# Patient Record
Sex: Female | Born: 1950
Health system: Southern US, Community
[De-identification: ages and names within clinical notes are randomized; demographics above are authoritative.]

## PROBLEM LIST (undated history)

## (undated) DIAGNOSIS — H3530 Unspecified macular degeneration: Secondary | ICD-10-CM

## (undated) DIAGNOSIS — R569 Unspecified convulsions: Secondary | ICD-10-CM

## (undated) DIAGNOSIS — M1990 Unspecified osteoarthritis, unspecified site: Secondary | ICD-10-CM

## (undated) DIAGNOSIS — F419 Anxiety disorder, unspecified: Secondary | ICD-10-CM

## (undated) DIAGNOSIS — H269 Unspecified cataract: Secondary | ICD-10-CM

## (undated) DIAGNOSIS — F329 Major depressive disorder, single episode, unspecified: Secondary | ICD-10-CM

## (undated) DIAGNOSIS — M797 Fibromyalgia: Secondary | ICD-10-CM

## (undated) DIAGNOSIS — J449 Chronic obstructive pulmonary disease, unspecified: Secondary | ICD-10-CM

## (undated) DIAGNOSIS — F32A Depression, unspecified: Secondary | ICD-10-CM

## (undated) HISTORY — DX: Anxiety disorder, unspecified: F41.9

## (undated) HISTORY — DX: Unspecified macular degeneration: H35.30

## (undated) HISTORY — DX: Unspecified cataract: H26.9

## (undated) HISTORY — DX: Unspecified osteoarthritis, unspecified site: M19.90

## (undated) HISTORY — DX: Depression, unspecified: F32.A

## (undated) HISTORY — DX: Unspecified convulsions: R56.9

## (undated) HISTORY — DX: Fibromyalgia: M79.7

## (undated) HISTORY — DX: Chronic obstructive pulmonary disease, unspecified: J44.9

## (undated) HISTORY — DX: Major depressive disorder, single episode, unspecified: F32.9

## (undated) HISTORY — PX: ABDOMINAL HYSTERECTOMY: SHX81

---

## 1999-01-14 ENCOUNTER — Encounter: Payer: Self-pay | Admitting: Emergency Medicine

## 1999-01-14 ENCOUNTER — Emergency Department (HOSPITAL_COMMUNITY): Admission: EM | Admit: 1999-01-14 | Discharge: 1999-01-14 | Payer: Self-pay | Admitting: Emergency Medicine

## 1999-04-05 ENCOUNTER — Encounter: Payer: Self-pay | Admitting: Internal Medicine

## 1999-04-05 ENCOUNTER — Inpatient Hospital Stay (HOSPITAL_COMMUNITY): Admission: EM | Admit: 1999-04-05 | Discharge: 1999-04-06 | Payer: Self-pay | Admitting: Emergency Medicine

## 1999-06-02 ENCOUNTER — Inpatient Hospital Stay (HOSPITAL_COMMUNITY): Admission: EM | Admit: 1999-06-02 | Discharge: 1999-06-05 | Payer: Self-pay | Admitting: *Deleted

## 2001-10-10 ENCOUNTER — Other Ambulatory Visit: Admission: RE | Admit: 2001-10-10 | Discharge: 2001-10-10 | Payer: Self-pay | Admitting: Obstetrics and Gynecology

## 2004-02-18 ENCOUNTER — Ambulatory Visit: Payer: Self-pay | Admitting: Family Medicine

## 2004-02-19 ENCOUNTER — Encounter: Admission: RE | Admit: 2004-02-19 | Discharge: 2004-02-19 | Payer: Self-pay | Admitting: Family Medicine

## 2004-02-25 ENCOUNTER — Ambulatory Visit: Payer: Self-pay | Admitting: Family Medicine

## 2004-02-25 ENCOUNTER — Other Ambulatory Visit: Admission: RE | Admit: 2004-02-25 | Discharge: 2004-02-25 | Payer: Self-pay | Admitting: Family Medicine

## 2004-03-02 ENCOUNTER — Ambulatory Visit: Payer: Self-pay

## 2004-03-13 ENCOUNTER — Ambulatory Visit: Payer: Self-pay | Admitting: Gastroenterology

## 2004-04-02 ENCOUNTER — Ambulatory Visit: Payer: Self-pay | Admitting: Gastroenterology

## 2004-06-16 ENCOUNTER — Ambulatory Visit: Payer: Self-pay | Admitting: Family Medicine

## 2004-07-01 ENCOUNTER — Ambulatory Visit: Payer: Self-pay | Admitting: Family Medicine

## 2004-10-01 ENCOUNTER — Ambulatory Visit: Payer: Self-pay | Admitting: Family Medicine

## 2004-10-13 ENCOUNTER — Ambulatory Visit: Payer: Self-pay | Admitting: Family Medicine

## 2005-03-25 ENCOUNTER — Ambulatory Visit: Payer: Self-pay | Admitting: Family Medicine

## 2005-08-17 ENCOUNTER — Ambulatory Visit: Payer: Self-pay | Admitting: Family Medicine

## 2005-09-01 ENCOUNTER — Ambulatory Visit: Payer: Self-pay | Admitting: Family Medicine

## 2005-09-30 ENCOUNTER — Emergency Department (HOSPITAL_COMMUNITY): Admission: EM | Admit: 2005-09-30 | Discharge: 2005-09-30 | Payer: Self-pay | Admitting: Emergency Medicine

## 2006-05-24 DIAGNOSIS — F329 Major depressive disorder, single episode, unspecified: Secondary | ICD-10-CM

## 2006-05-24 DIAGNOSIS — M779 Enthesopathy, unspecified: Secondary | ICD-10-CM | POA: Insufficient documentation

## 2009-11-16 ENCOUNTER — Encounter: Admission: RE | Admit: 2009-11-16 | Discharge: 2009-11-16 | Payer: Self-pay | Admitting: Emergency Medicine

## 2010-02-22 ENCOUNTER — Encounter: Payer: Self-pay | Admitting: Family Medicine
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
## 2010-06-19 NOTE — Cardiovascular Report (Signed)
[place]  Patient:    Jasmin Munoz, Jasmin Munoz                       MRN: [id number] Proc. Date: 04/06/99 Adm. Date:  [id number] Disc. Date: [id number] Attending:  Lewayne Bunting CC:         [place]. Andrey Campanile, M.D.             Maylon Peppers Cleta Alberts, M.D., [facility]             Bruce R. Juanda Chance, M.D. [facility]             Cardiopulmonary Laboratory             Rudene Christians. Ladona Ridgel, M.D. [facility]                        Cardiac Catheterization  CLINICAL HISTORY:  Ms. Pinckney is 60 years old and has positive risk factors for  coronary disease with a positive family history and cigarette smoking.  She was  seen in [facility] by Dr. Cleta Alberts yesterday with chest pain radiating up nto her neck and arm and was referred to Dr. Ladona Ridgel, who admitted her with a presumptive diagnosis of possible unstable angina.  DESCRIPTION OF PROCEDURE:  The procedure was performed via the right femoral artery using an arterial sheath and 6 French preformed coronary catheters.  A front wall arterial puncture was performed and Omnipaque contrast was used.  The right femoral artery was closed with Perclose at the end of the procedure.  The patient tolerated the procedure well and left the laboratory in satisfactory condition.  RESULTS:  The left main coronary artery:  The left main coronary artery was free of significant disease.  Left anterior descending:  The left anterior descending artery gave rise to two  septal perforators and three diagonal branches.  These and the LAD proper were ree of significant disease.  Circumflex artery:  The circumflex artery gave rise to a marginal branch and three posterolateral branches.  These vessels were free of significant disease.  Right coronary artery:  The right coronary was a large dominant vessel that gave rise to a posterior descending and three posterolateral branches.  These vessels were free of significant disease.  LEFT  VENTRICULOGRAPHY:  The left ventriculogram was performed in the RAO projection showed good wall motion with no areas of hypokinesis.  The estimated ejection fraction was 60%.  The aortic pressure was 122/70 with a mean of 90.  The left ventricular pressure was 122/13.  CONCLUSIONS:  Normal coronary angiography and normal left ventricular wall motion.  RECOMMENDATIONS:  Reassurance.  The patient has only had one episode of chest pain. She does not have other symptoms suggestive of reflux.  Her O2 saturations during the case was 98%.  I will not plan any further evaluation at this time and will  arrange followup with Dr. Andrey Campanile, who can decide if she needs any further evaluation should she have recurrent symptoms. DD:  04/06/99 TD:  04/07/99 Job: [id number]

## 2010-06-19 NOTE — Discharge Summary (Signed)
[place]  Patient:    Jasmin Munoz, Jasmin Munoz                         MRN: Adm. Date:  04/05/99 Disc. Date: 04/06/99 Attending:  Doylene Canning. Ladona Ridgel, M.D. [facility] Dictator:   Gene Serpe, P.A. CC:         [place]. Andrey Campanile, M.D.                           Discharge Summary  PROCEDURE:  Cardiac catheterization on April 06, 1999.  HISTORY OF PRESENT ILLNESS:  [name] is a 60 year old female, without a prior history of heart disease, but with cardiac risk factors notable for tobacco smoking, and a family history of coronary artery disease, who presented with new onset chest discomfort.  She was initially seen in an urgent medical clinic, where she reported some relief with nitroglycerin; however, she continued to have residual chest pain and was referred to [facility] for further evaluation and management.  LABORATORY DATA:  Metabolic profile and CBC within normal limits.  CPK-MB negative x 2.  Troponin I less than 0.03 x 2.  Normal TSH.  Negative urinalysis.  HOSPITAL COURSE:  The patient ruled out for a myocardial infarction with negative serial cardiac enzymes.  Aspirin, Lopressor, and Lovenox were added to the medication regimen.  A cardiac catheterization performed on the day of discharge by Dr. Everardo Beals. Juanda Chance revealed normal coronary arteries and normal left ventricle.  Post-procedure the patient was mildly hypotensive (90-100 range), and was treated with fluids.  She was ambulatory without complications of bleeding from her groin. Of note, the incision site was closed with Perclose.  DISCHARGE MEDICATIONS:  Paxil 20 mg q.d.  INSTRUCTIONS:  The patient is to refrain from any strenuous activity or driving for two days.  She is to discontinue smoking tobacco.  FOLLOWUP:  The patient is instructed to schedule a follow-up appointment with her primary care physician, Dr. Duffy Rhody C. Wilson, in two weeks.  DISCHARGE  DIAGNOSES: 1. Noncardiac chest pain:    a. Normal coronary arteries/left ventricle.  Cardiac catheterization on       April 06, 1999. 2. Hypotension. 3. Tobacco smoking. 4. History of depression. DD:  04/06/99 TD:  04/06/99 Job: [id number]

## 2010-12-17 ENCOUNTER — Telehealth: Payer: Self-pay | Admitting: Family Medicine

## 2010-12-17 NOTE — Telephone Encounter (Signed)
NO I cannot accept this patient

## 2010-12-17 NOTE — Telephone Encounter (Signed)
Pt called to establish as a new pt and said she had medicare. Pt was informed at this time we are not excepting new medicare pts. Pt said you came highly recomended to her and she would pay out of pocket she was informed that it is 125 upfront at every visit and that she would be billed for the remainder. She is currently going to a pain management clinic to get her medications but does not want to go monthly to get her meds any more and is hoping to est with you. Please advise

## 2011-01-07 ENCOUNTER — Ambulatory Visit (INDEPENDENT_AMBULATORY_CARE_PROVIDER_SITE_OTHER): Payer: Medicare Other | Admitting: Family Medicine

## 2011-01-07 DIAGNOSIS — I10 Essential (primary) hypertension: Secondary | ICD-10-CM

## 2011-01-07 DIAGNOSIS — R21 Rash and other nonspecific skin eruption: Secondary | ICD-10-CM

## 2011-02-12 DIAGNOSIS — M533 Sacrococcygeal disorders, not elsewhere classified: Secondary | ICD-10-CM | POA: Diagnosis not present

## 2011-02-12 DIAGNOSIS — F341 Dysthymic disorder: Secondary | ICD-10-CM | POA: Diagnosis not present

## 2011-02-12 DIAGNOSIS — R209 Unspecified disturbances of skin sensation: Secondary | ICD-10-CM | POA: Diagnosis not present

## 2011-02-12 DIAGNOSIS — M545 Low back pain, unspecified: Secondary | ICD-10-CM | POA: Diagnosis not present

## 2011-03-02 ENCOUNTER — Encounter: Payer: Self-pay | Admitting: Gastroenterology

## 2011-03-11 DIAGNOSIS — R209 Unspecified disturbances of skin sensation: Secondary | ICD-10-CM | POA: Diagnosis not present

## 2011-03-11 DIAGNOSIS — Z79899 Other long term (current) drug therapy: Secondary | ICD-10-CM | POA: Diagnosis not present

## 2011-03-11 DIAGNOSIS — M545 Low back pain, unspecified: Secondary | ICD-10-CM | POA: Diagnosis not present

## 2011-03-11 DIAGNOSIS — M533 Sacrococcygeal disorders, not elsewhere classified: Secondary | ICD-10-CM | POA: Diagnosis not present

## 2011-03-11 DIAGNOSIS — M542 Cervicalgia: Secondary | ICD-10-CM | POA: Diagnosis not present

## 2011-03-11 DIAGNOSIS — F341 Dysthymic disorder: Secondary | ICD-10-CM | POA: Diagnosis not present

## 2011-04-06 DIAGNOSIS — R209 Unspecified disturbances of skin sensation: Secondary | ICD-10-CM | POA: Diagnosis not present

## 2011-04-06 DIAGNOSIS — F341 Dysthymic disorder: Secondary | ICD-10-CM | POA: Diagnosis not present

## 2011-04-06 DIAGNOSIS — M545 Low back pain, unspecified: Secondary | ICD-10-CM | POA: Diagnosis not present

## 2011-04-06 DIAGNOSIS — M533 Sacrococcygeal disorders, not elsewhere classified: Secondary | ICD-10-CM | POA: Diagnosis not present

## 2011-05-20 DIAGNOSIS — M545 Low back pain, unspecified: Secondary | ICD-10-CM | POA: Diagnosis not present

## 2011-05-20 DIAGNOSIS — R209 Unspecified disturbances of skin sensation: Secondary | ICD-10-CM | POA: Diagnosis not present

## 2011-05-20 DIAGNOSIS — M533 Sacrococcygeal disorders, not elsewhere classified: Secondary | ICD-10-CM | POA: Diagnosis not present

## 2011-05-20 DIAGNOSIS — F341 Dysthymic disorder: Secondary | ICD-10-CM | POA: Diagnosis not present

## 2011-05-21 ENCOUNTER — Ambulatory Visit: Payer: Medicare Other

## 2011-06-17 ENCOUNTER — Encounter: Payer: Self-pay | Admitting: Family Medicine

## 2011-06-17 ENCOUNTER — Ambulatory Visit (INDEPENDENT_AMBULATORY_CARE_PROVIDER_SITE_OTHER): Payer: Medicare Other | Admitting: Family Medicine

## 2011-06-17 VITALS — BP 122/86 | HR 84 | Temp 98.1°F | Resp 16 | Ht 60.75 in | Wt 131.4 lb

## 2011-06-17 DIAGNOSIS — R21 Rash and other nonspecific skin eruption: Secondary | ICD-10-CM

## 2011-06-17 DIAGNOSIS — M542 Cervicalgia: Secondary | ICD-10-CM

## 2011-06-17 DIAGNOSIS — Z79899 Other long term (current) drug therapy: Secondary | ICD-10-CM

## 2011-06-17 LAB — COMPREHENSIVE METABOLIC PANEL
ALT: 11 U/L (ref 0–35)
AST: 19 U/L (ref 0–37)
Albumin: 4.4 g/dL (ref 3.5–5.2)
Alkaline Phosphatase: 91 U/L (ref 39–117)
BUN: 15 mg/dL (ref 6–23)
CO2: 26 mEq/L (ref 19–32)
Calcium: 9.4 mg/dL (ref 8.4–10.5)
Chloride: 106 mEq/L (ref 96–112)
Creat: 0.75 mg/dL (ref 0.50–1.10)
Glucose, Bld: 82 mg/dL (ref 70–99)
Potassium: 4.9 mEq/L (ref 3.5–5.3)
Sodium: 140 mEq/L (ref 135–145)
Total Bilirubin: 0.4 mg/dL (ref 0.3–1.2)
Total Protein: 6.9 g/dL (ref 6.0–8.3)

## 2011-06-17 LAB — CBC WITH DIFFERENTIAL/PLATELET
Basophils Absolute: 0.1 10*3/uL (ref 0.0–0.1)
Basophils Relative: 1 % (ref 0–1)
Eosinophils Absolute: 0.1 10*3/uL (ref 0.0–0.7)
Eosinophils Relative: 1 % (ref 0–5)
HCT: 42.5 % (ref 36.0–46.0)
Hemoglobin: 14.2 g/dL (ref 12.0–15.0)
Lymphocytes Relative: 33 % (ref 12–46)
Lymphs Abs: 2.9 10*3/uL (ref 0.7–4.0)
MCH: 30.9 pg (ref 26.0–34.0)
MCHC: 33.4 g/dL (ref 30.0–36.0)
MCV: 92.4 fL (ref 78.0–100.0)
Monocytes Absolute: 0.5 10*3/uL (ref 0.1–1.0)
Monocytes Relative: 6 % (ref 3–12)
Neutro Abs: 5.3 10*3/uL (ref 1.7–7.7)
Neutrophils Relative %: 59 % (ref 43–77)
Platelets: 259 10*3/uL (ref 150–400)
RBC: 4.6 MIL/uL (ref 3.87–5.11)
RDW: 13.1 % (ref 11.5–15.5)
WBC: 9 10*3/uL (ref 4.0–10.5)

## 2011-06-17 LAB — POCT SEDIMENTATION RATE: POCT SED RATE: 21 mm/hr (ref 0–22)

## 2011-06-17 MED ORDER — CEPHALEXIN 500 MG PO CAPS: 500.0000 mg | ORAL_CAPSULE | Freq: Three times a day (TID) | ORAL | Status: DC

## 2011-06-17 MED ORDER — HYDROCODONE-ACETAMINOPHEN 10-650 MG PO TABS: 1.0000 | ORAL_TABLET | Freq: Four times a day (QID) | ORAL | Status: DC | PRN
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
## 2011-06-17 NOTE — Patient Instructions (Signed)
Narcotic Withdrawal  When drug use interferes with normal living activities and relationships, it is abuse. Abuse includes problems with family and friends. Psychological dependence has developed when your mind tells you that the drug is needed. This is usually followed by a physical dependence in which you need more of the drug to get the same feeling or "high." This is known as addiction or chemical dependency. Risk is greater when chemical dependency exists in the family.  SYMPTOMS   When tolerance to narcotics has developed, stopping of the narcotic suddenly can cause uncomfortable physical symptoms. Most of the time these are mild and consist of shakes or jitters (tremors) in the hands,a rapid heart rate, rapid breathing, and temperature. Sometimes these symptoms are associated with anxiety, panic attacks, and bad dreams. Other symptoms include:   Irritability.   Anxiety.   Runny nose.   "Goose flesh."   Diarrhea.   Feeling sick to the stomach (nauseous).   Muscle spasms.   Sleeplessness.   Chills.   Sweats.   Drug cravings.   Confusion.  The severity of the withdrawal is based on the individual and varies from person to person. Many people choose to continue using narcotics to get rid of the discomfort of withdrawal. They also use to try to feel normal.  TREATMENT   Quitting an addiction means stopping use of all chemicals. This is hard but may save your life. With continual drug use, possible outcomes are often loss of self respect and esteem, violence, death, and eventually prison if the use of narcotics has led to the death of another.  Addiction cannot be cured, but it can be stopped. This often requires outside help and the care of professionals. Most hospitals and clinics can refer you to a specialized care center.  It is not necessary for you to go through the uncomfortable symptoms of withdrawal. Your caregiver can provide you with medications that will help you through this difficult  period. Try to avoid situations, friends, or alcohol, which may have made it possible for you to continue using narcotics in the past. Learn how to say no!  HOME CARE INSTRUCTIONS    Drink fluids, get plenty of rest, and take hot baths.   Medicines may be prescribed to help control withdrawal symptoms.   Over-the-counter medicines may be helpful to control diarrhea or an upset stomach.   If your problems resulted from taking prescription pain medicines, make sure you have a follow-up visit with your caregiver within the next few days. Be open about this problem.   If you are dependent or addicted to street drugs, contact a local drug and alcohol treatment center or Narcotics Anonymous.   Have someone with you to monitor your symptoms.   Engage in healthy activities with friends who do not use drugs.   Stay away from the drug scene.  It takes a long period of time to overcome addictions to all drugs. There may be times when you feel as though you want to use. Following loss of a physical addiction and going through withdrawal, you have conquered the most difficult part of getting rid of an addiction. Gradually, you will have a lessening of the craving that is telling you that you need narcotics to feel normal. Call your caregiver or a member of your support group if more support is needed. Learn who to talk to in your family and among your friends so that during these periods you can receive outside help.  SEEK IMMEDIATE   MEDICAL CARE IF:    You have vomiting that cannot be controlled, especially if you cannot keep liquids down.   You are seeing things or hearing voices that are not really there (hallucinating).   You have a seizure.  Document Released: 04/10/2002 Document Revised: 01/07/2011 Document Reviewed: 01/14/2008  [employer] Patient Information 2012 [employer].

## 2011-07-01 ENCOUNTER — Ambulatory Visit: Payer: Medicare Other | Admitting: Family Medicine

## 2011-07-01 ENCOUNTER — Other Ambulatory Visit: Payer: Self-pay | Admitting: Family Medicine

## 2011-07-01 DIAGNOSIS — G8929 Other chronic pain: Secondary | ICD-10-CM

## 2011-07-01 DIAGNOSIS — R21 Rash and other nonspecific skin eruption: Secondary | ICD-10-CM

## 2011-07-01 MED ORDER — DOXYCYCLINE HYCLATE 100 MG PO TABS: 100.0000 mg | ORAL_TABLET | Freq: Two times a day (BID) | ORAL | Status: DC

## 2011-07-01 NOTE — Progress Notes (Signed)
Patient says the Lorcet is tearing up her stomach and wants to get another Rx.  I explained that as a family practice doctor, I cannot write for chronic pain meds.  Also the skin rash on left arm is coming back  P:  Doxycycline 100 bid See narcotic counselor Ardelle Lesches

## 2011-07-04 ENCOUNTER — Ambulatory Visit: Payer: Medicare Other

## 2011-08-22 DIAGNOSIS — H1045 Other chronic allergic conjunctivitis: Secondary | ICD-10-CM | POA: Diagnosis not present

## 2011-08-22 DIAGNOSIS — M129 Arthropathy, unspecified: Secondary | ICD-10-CM | POA: Diagnosis not present

## 2011-08-22 DIAGNOSIS — G8929 Other chronic pain: Secondary | ICD-10-CM | POA: Diagnosis not present

## 2011-08-22 DIAGNOSIS — L259 Unspecified contact dermatitis, unspecified cause: Secondary | ICD-10-CM | POA: Diagnosis not present

## 2011-09-28 ENCOUNTER — Telehealth: Payer: Self-pay

## 2011-09-28 DIAGNOSIS — M542 Cervicalgia: Secondary | ICD-10-CM

## 2011-09-28 NOTE — Telephone Encounter (Signed)
Pt states she would like to talk with dr Milus Glazier about being referred to a pain clinic  Best number [phone]

## 2011-09-29 ENCOUNTER — Other Ambulatory Visit: Payer: Self-pay | Admitting: Family Medicine

## 2011-09-29 DIAGNOSIS — G8929 Other chronic pain: Secondary | ICD-10-CM

## 2011-09-29 NOTE — Telephone Encounter (Signed)
Pt has decided that she would like to have a referral to a new pain specialist as discussed at OV w/Dr L in May. She would like to see Dr Claudette Laws if possible. Dr L I have put this referral order in for you per your OV notes, "verbal w/readback" for your approval.

## 2011-11-22 ENCOUNTER — Telehealth: Payer: Self-pay

## 2011-11-22 NOTE — Telephone Encounter (Signed)
Pt would like a referral to Dr Caswell Corwin?? Says she has been trying to get this appt for a month and does not know what the problem is

## 2011-11-22 NOTE — Telephone Encounter (Signed)
Can you check on referral to pain clinic?

## 2011-11-30 ENCOUNTER — Ambulatory Visit (INDEPENDENT_AMBULATORY_CARE_PROVIDER_SITE_OTHER): Payer: Medicare Other | Admitting: Family Medicine

## 2011-11-30 VITALS — BP 130/98 | HR 87 | Temp 98.7°F | Resp 18 | Wt 132.0 lb

## 2011-11-30 DIAGNOSIS — M542 Cervicalgia: Secondary | ICD-10-CM

## 2011-11-30 DIAGNOSIS — M47812 Spondylosis without myelopathy or radiculopathy, cervical region: Secondary | ICD-10-CM | POA: Diagnosis not present

## 2011-11-30 MED ORDER — HYDROCODONE-ACETAMINOPHEN 10-325 MG PO TABS: 1.0000 | ORAL_TABLET | Freq: Three times a day (TID) | ORAL | Status: DC | PRN

## 2011-11-30 NOTE — Progress Notes (Signed)
Patient is complaining of left sided occipital and posterior neck pain over a month, worse with movement of neck.  No arm weakness, radicular symptoms or numbness  Objective:  NAD Good arm ROM, strength Reflexes:  Good BJ. TJ Neck ROM:  Rotation 30 degrees each way, 30 degrees extension and flexion   Findings: There is posterior tilting of the dens. The dens is  sclerotic. There is thickening of the transverse ligament. This  may be due to arthropathy or possibly old healed fracture of the  dens. No bone marrow edema. There is an effusion in the lateral  facet of C1 and C2, likely due to arthropathy. Negative for acute  fracture or mass lesion. Spinal cord signal is normal. 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 This is  most likely an incidental finding.  C2-3: Negative  C3-4: Mild disc and facet degeneration without stenosis.  C4-5: Negative  C5-6: Moderate disc degeneration and spondylosis. Diffuse  uncovertebral spurring causes mild spinal stenosis and mild  foraminal encroachment bilaterally.  C6-7: Disc degeneration and spondylosis. There is diffuse  uncovertebral spurring without significant spinal stenosis.  C7-T1: Grade 1 anterior slip with facet degeneration. No  significant spinal stenosis  IMPRESSION:  Cervical disc degeneration and spondylosis, most prominent at C5-6  with mild spinal stenosis and foraminal narrowing bilaterally due  to spurring.  Sclerotic dense with thickening of the transverse ligament. This  is most likely due to arthropathy such as CPPD or rheumatoid  arthritis. This could also be post traumatic.  Assessment:  Neck arthritis, chronic

## 2011-11-30 NOTE — Patient Instructions (Addendum)

## 2011-12-10 ENCOUNTER — Encounter: Payer: Self-pay | Admitting: Gastroenterology

## 2012-02-15 ENCOUNTER — Other Ambulatory Visit: Payer: Self-pay | Admitting: *Deleted

## 2012-02-15 MED ORDER — METOPROLOL TARTRATE 50 MG PO TABS: 50.0000 mg | ORAL_TABLET | Freq: Two times a day (BID) | ORAL | Status: DC

## 2012-02-22 ENCOUNTER — Ambulatory Visit (INDEPENDENT_AMBULATORY_CARE_PROVIDER_SITE_OTHER): Payer: Medicare Other | Admitting: Family Medicine

## 2012-02-22 VITALS — BP 131/91 | HR 62 | Temp 97.9°F | Resp 16 | Ht 61.7 in | Wt 128.4 lb

## 2012-02-22 DIAGNOSIS — M47812 Spondylosis without myelopathy or radiculopathy, cervical region: Secondary | ICD-10-CM | POA: Diagnosis not present

## 2012-02-22 DIAGNOSIS — I10 Essential (primary) hypertension: Secondary | ICD-10-CM

## 2012-02-22 MED ORDER — HYDROCODONE-ACETAMINOPHEN 7.5-325 MG PO TABS: 1.0000 | ORAL_TABLET | Freq: Three times a day (TID) | ORAL | Status: DC | PRN

## 2012-02-22 MED ORDER — METOPROLOL TARTRATE 50 MG PO TABS: 50.0000 mg | ORAL_TABLET | Freq: Every day | ORAL | Status: DC

## 2012-02-22 MED ORDER — HYDROCODONE-ACETAMINOPHEN 7.5-750 MG PO TABS: 1.0000 | ORAL_TABLET | Freq: Three times a day (TID) | ORAL | Status: DC | PRN

## 2012-02-22 NOTE — Progress Notes (Signed)
55 woman with chronic neck pain.  She is here for hypertension review. She also needs to have her pain medicine reviewed. Seeing Dr. Toni Arthurs  Obj:  NAD Neck:  No bruits Chest:  Clear Heart: reg, no murmur Ext:  1+ edema  Assessment:  Controlled htn  Plan:  1. Neck arthritis  Ambulatory referral to Pain Clinic, HYDROcodone-acetaminophen (VICODIN ES) 7.5-750 MG per tablet  2. Hypertension  metoprolol (LOPRESSOR) 50 MG tablet

## 2012-02-22 NOTE — Patient Instructions (Signed)

## 2012-02-24 ENCOUNTER — Ambulatory Visit: Payer: Medicare Other | Admitting: Family Medicine

## 2012-02-24 NOTE — Progress Notes (Signed)
Addendum to note of 1/21

## 2012-03-23 ENCOUNTER — Telehealth: Payer: Self-pay | Admitting: Radiology

## 2012-03-23 NOTE — Telephone Encounter (Signed)
Patient has Rx for Hydrocodone it was written by Dr Milus Glazier for one every 8 hours, she wants to take it one every 6 hours. I have advised pharmacy she should take it as directed, one every 8 hours. Dr Milus Glazier is out of the office and can not alter dose. Jasmin Munoz FYI

## 2012-03-23 NOTE — Telephone Encounter (Signed)
She must take this as prescribed. We cannot change the frequency

## 2012-03-24 ENCOUNTER — Ambulatory Visit (INDEPENDENT_AMBULATORY_CARE_PROVIDER_SITE_OTHER): Payer: Medicare Other | Admitting: Family Medicine

## 2012-03-24 VITALS — BP 124/87 | HR 76 | Temp 97.9°F | Resp 16 | Ht 61.0 in | Wt 129.4 lb

## 2012-03-24 DIAGNOSIS — N390 Urinary tract infection, site not specified: Secondary | ICD-10-CM | POA: Diagnosis not present

## 2012-03-24 DIAGNOSIS — G894 Chronic pain syndrome: Secondary | ICD-10-CM | POA: Diagnosis not present

## 2012-03-24 DIAGNOSIS — Z8739 Personal history of other diseases of the musculoskeletal system and connective tissue: Secondary | ICD-10-CM | POA: Diagnosis not present

## 2012-03-24 DIAGNOSIS — R350 Frequency of micturition: Secondary | ICD-10-CM

## 2012-03-24 LAB — POCT URINALYSIS DIPSTICK
Bilirubin, UA: NEGATIVE
Nitrite, UA: NEGATIVE
pH, UA: 6

## 2012-03-24 LAB — POCT UA - MICROSCOPIC ONLY: Yeast, UA: NEGATIVE

## 2012-03-24 MED ORDER — HYDROCODONE-ACETAMINOPHEN 7.5-325 MG PO TABS: 1.0000 | ORAL_TABLET | Freq: Three times a day (TID) | ORAL | Status: DC | PRN

## 2012-03-24 MED ORDER — CIPROFLOXACIN HCL 500 MG PO TABS: 500.0000 mg | ORAL_TABLET | Freq: Two times a day (BID) | ORAL | Status: DC

## 2012-03-24 NOTE — Progress Notes (Signed)
Subjective:  62 year old lady who usually sees Dr. Elbert Ewings., but [place] is [place] the [place]. She presents today with a four-day history of urinary frequency and urgency and dysuria. She has not had a lot of urinary tract infections. She tried last night using a patch to decrease her urine flow, but it didn't help a lot. She is up every 20-30 minutes all night. She has burning when she urinates. She has not been running a fever.  She has never heard from the referral to the pain clinic that was initiated a month ago. She was given 120 of the pain pills or prescription last month, and noting the number of pills she took him out of fashion that use them in 30 days, averaging 4 per day. She says she did not see that the label said every 8 hours, that she was always given a month supply per prescription in the past. She denies intentional abuse.  She does see a psychiatrist who manages her other medications. She is having a lot of trouble with her nerves, and distress from her 66 year old husband who has early onset Alzheimer's.  Objective: Pleasant alert female in no major distress. No major CVA tenderness of her back is a little tender, presumably chronic from her fibromyalgia. Her abdomen is soft and nontender. She is afebrile.  Results for orders placed in visit on 03/24/12  POCT UA - MICROSCOPIC ONLY      Result Value Range   WBC, Ur, HPF, POC TNTC     RBC, urine, microscopic 0-1     Bacteria, U Microscopic 3+     Mucus, UA positive     Epithelial cells, urine per micros 0-2     Crystals, Ur, HPF, POC neg     Casts, Ur, LPF, POC neg     Yeast, UA neg    POCT URINALYSIS DIPSTICK      Result Value Range   Color, UA yellow     Clarity, UA turbid     Glucose, UA neg     Bilirubin, UA neg     Ketones, UA 15     Spec Grav, UA 1.020     Blood, UA trace-lysed     pH, UA 6.0     Protein, UA 30     Urobilinogen, UA 0.2     Nitrite, UA neg     Leukocytes, UA moderate (2+)     Assessment: Urinary  tract infection Chronic pain syndrome with fibromyalgia  Plan: Try to reorder the pain clinic referral to have them check on why she has not heard from this yet. Presumably some cut section of been made by now.  Will give her an intermediate 10 day supply of her pain medication and tell pharmacy lateral refill the other bottle of medicine. She understands that she is supposed to 3 pills a day, so a full prescription bottle her doctor her son's prescriptions should last 40 days or so. She is urged to minimize the use of pain medications. That would help her in preparation for going to the pain clinic.  Treat urinary tract infection with Cipro. She is allergic to sulfa.  Return if problems

## 2012-03-24 NOTE — Patient Instructions (Signed)
Urinary Tract Infection Urinary tract infections (UTIs) can develop anywhere along your urinary tract. Your urinary tract is your body's drainage system for removing wastes and extra water. Your urinary tract includes two kidneys, two ureters, a bladder, and a urethra. Your kidneys are a pair of bean-shaped organs. Each kidney is about the size of your fist. They are located below your ribs, one on each side of your spine. CAUSES Infections are caused by microbes, which are microscopic organisms, including fungi, viruses, and bacteria. These organisms are so small that they can only be seen through a microscope. Bacteria are the microbes that most commonly cause UTIs. SYMPTOMS  Symptoms of UTIs may vary by age and gender of the patient and by the location of the infection. Symptoms in young women typically include a frequent and intense urge to urinate and a painful, burning feeling in the bladder or urethra during urination. Older women and men are more likely to be tired, shaky, and weak and have muscle aches and abdominal pain. A fever may mean the infection is in your kidneys. Other symptoms of a kidney infection include pain in your back or sides below the ribs, nausea, and vomiting. DIAGNOSIS To diagnose a UTI, your caregiver will ask you about your symptoms. Your caregiver also will ask to provide a urine sample. The urine sample will be tested for bacteria and white blood cells. White blood cells are made by your body to help fight infection. TREATMENT  Typically, UTIs can be treated with medication. Because most UTIs are caused by a bacterial infection, they usually can be treated with the use of antibiotics. The choice of antibiotic and length of treatment depend on your symptoms and the type of bacteria causing your infection. HOME CARE INSTRUCTIONS  If you were prescribed antibiotics, take them exactly as your caregiver instructs you. Finish the medication even if you feel better after you  have only taken some of the medication.  Drink enough water and fluids to keep your urine clear or pale yellow.  Avoid caffeine, tea, and carbonated beverages. They tend to irritate your bladder.  Empty your bladder often. Avoid holding urine for long periods of time.  Empty your bladder before and after sexual intercourse.  After a bowel movement, women should cleanse from front to back. Use each tissue only once. SEEK MEDICAL CARE IF:   You have back pain.  You develop a fever.  Your symptoms do not begin to resolve within 3 days. SEEK IMMEDIATE MEDICAL CARE IF:   You have severe back pain or lower abdominal pain.  You develop chills.  You have nausea or vomiting.  You have continued burning or discomfort with urination. MAKE SURE YOU:   Understand these instructions.  Will watch your condition.  Will get help right away if you are not doing well or get worse. Document Released: 10/28/2004 Document Revised: 07/20/2011 Document Reviewed: 02/26/2011 [employer] Patient Information 2013 [employer].  

## 2012-03-27 ENCOUNTER — Other Ambulatory Visit: Payer: Self-pay | Admitting: Radiology

## 2012-03-27 MED ORDER — AMOXICILLIN 500 MG PO TABS: 500.0000 mg | ORAL_TABLET | Freq: Three times a day (TID) | ORAL | Status: DC

## 2012-04-22 ENCOUNTER — Telehealth: Payer: Self-pay

## 2012-04-22 DIAGNOSIS — Z8739 Personal history of other diseases of the musculoskeletal system and connective tissue: Secondary | ICD-10-CM

## 2012-04-22 DIAGNOSIS — G894 Chronic pain syndrome: Secondary | ICD-10-CM

## 2012-04-22 NOTE — Telephone Encounter (Signed)
Raynelle Fanning from [facility] called saying that the patient is in need of RX Refill of hydrocodone, the pt has been seen Dr. Elbert Ewings

## 2012-04-24 ENCOUNTER — Telehealth: Payer: Self-pay

## 2012-04-24 MED ORDER — HYDROCODONE-ACETAMINOPHEN 7.5-325 MG PO TABS: 1.0000 | ORAL_TABLET | Freq: Three times a day (TID) | ORAL | Status: DC | PRN

## 2012-04-24 NOTE — Telephone Encounter (Signed)
Okay to refill the hydrocodone x 1.  I am confused about why patient has not gotten in with a pain clinic to date.  Can someone help me find out why?

## 2012-04-24 NOTE — Telephone Encounter (Signed)
Called in. Called patient to advise, and to check status of pain management. She states she did hear from the pain management clinic. She states she does not want to go back. She went couple weeks ago. I told her to call. She states she wants to see if she can go to pain management at [facility]. I told her to call them and see if they will accept her,but I do not think they will. I told her if they will accept the referral, we will send. To you FYI. I advised this is the last Rx from you.

## 2012-04-24 NOTE — Telephone Encounter (Signed)
Darl Pikes from [facility] is calling back to talk to Amy or another nurse about the Pts medication. Call [facility] back at [phone]

## 2012-04-25 NOTE — Telephone Encounter (Signed)
She has a refill #30 2/21 from Dr Alwyn Ren #120 then has Rx written #120  02/22/12 refilled on 03/29/12 has one refill left for quantity of 120. Please see me about this, do you want her to have the #120 or the #30, pharmacy concerned she has taken quantity of 240 already this month.

## 2012-04-30 ENCOUNTER — Telehealth: Payer: Self-pay

## 2012-04-30 NOTE — Telephone Encounter (Signed)
DR L - PT SAYS SHE HAS A PRESCRIPTION OF HYDROCODONE WAITING FOR HER AT THE PHARMACY.  SAYS SHE WANTS THE SCRIPT CANCELLED BECAUSE SHE IS "NOT GOING BACK DOWN THAT ROAD."  SAYS TO TELL DR. Ronnald Ramp YOU FOR EVERYTHING. [phone]

## 2012-05-01 ENCOUNTER — Telehealth: Payer: Self-pay

## 2012-05-01 NOTE — Telephone Encounter (Signed)
PATIENT IS CALLING TO HAVE A PAIN MEDICATION REFILLED. PATIENT STATES THAT SHE HAS ALREADY CONTACTED HER PHARMACY ABOUT THIS. PATIENT STATES THAT SHE HAS GONE 5 DAYS WITHOUT HER PAIN MEDS.   PHARMACY: [facility] [address] CALL BACK: [phone]

## 2012-05-01 NOTE — Telephone Encounter (Signed)
This is phone call #3 from her today. To you

## 2012-05-01 NOTE — Telephone Encounter (Signed)
Pharmacy holds #30 and #120, she has gone to pain management, refuses to go back. Wants to know if you will release the Rx to her. Please advise.

## 2012-05-01 NOTE — Telephone Encounter (Signed)
Duplicate, message sent to Dr L this am.

## 2012-05-01 NOTE — Telephone Encounter (Signed)
Pt states she will begin a pain management clinic on 05/12/12. Pt is requesting 11 days of hyrdrocodone, to hold her over until she goes to the pain management clinic Please call pt to advise

## 2012-05-01 NOTE — Telephone Encounter (Signed)
Pt is wanting to talk with dr Jenness Corner about going back on the pain medications that she was taking her self off of earlier this week and since the rx at [facility] [address] has been cancelled they state it will have to be a new  rx   Best number for patient is [phone]

## 2012-05-02 NOTE — Telephone Encounter (Signed)
I cannot refill narcotics.  I gave her enough to last until her pain clinic referral.  I cannot do any more

## 2012-05-02 NOTE — Telephone Encounter (Signed)
I have called her to advise.  

## 2012-05-12 DIAGNOSIS — G541 Lumbosacral plexus disorders: Secondary | ICD-10-CM | POA: Diagnosis not present

## 2012-05-12 DIAGNOSIS — Z9181 History of falling: Secondary | ICD-10-CM | POA: Diagnosis not present

## 2012-05-12 DIAGNOSIS — M545 Low back pain, unspecified: Secondary | ICD-10-CM | POA: Diagnosis not present

## 2012-05-12 DIAGNOSIS — H8190 Unspecified disorder of vestibular function, unspecified ear: Secondary | ICD-10-CM | POA: Diagnosis not present

## 2012-05-12 DIAGNOSIS — Z79899 Other long term (current) drug therapy: Secondary | ICD-10-CM | POA: Diagnosis not present

## 2012-05-12 DIAGNOSIS — S335XXA Sprain of ligaments of lumbar spine, initial encounter: Secondary | ICD-10-CM | POA: Diagnosis not present

## 2012-05-12 DIAGNOSIS — H81399 Other peripheral vertigo, unspecified ear: Secondary | ICD-10-CM | POA: Diagnosis not present

## 2012-05-12 DIAGNOSIS — M533 Sacrococcygeal disorders, not elsewhere classified: Secondary | ICD-10-CM | POA: Diagnosis not present

## 2012-05-12 DIAGNOSIS — G608 Other hereditary and idiopathic neuropathies: Secondary | ICD-10-CM | POA: Diagnosis not present

## 2012-05-12 DIAGNOSIS — M542 Cervicalgia: Secondary | ICD-10-CM | POA: Diagnosis not present

## 2012-05-30 ENCOUNTER — Telehealth: Payer: Self-pay | Admitting: Family

## 2012-05-30 NOTE — Telephone Encounter (Signed)
Spoke with pt. She states that she is being seen at a pain clinic currently and she "can't stand it." She says "it's just a money making thing" and they want her to go to PT and counseling and she has been through all of that before. She states that she is trying to "come off" of the narcotics. Advised pt again that we do not tx chronic pain and if she is truly wanting to wean off, then she will need to continue to see pain clinic. Pt decided to cancel appointment

## 2012-05-30 NOTE — Telephone Encounter (Signed)
Please advise patient that I would be glad to see her but [facility] does not treat chronic pain. She can see the pain clinic. Let me know what she decides and cancel appointment if she does not still want to be seen. Thank you!

## 2012-06-02 ENCOUNTER — Ambulatory Visit: Payer: Self-pay | Admitting: Family

## 2012-06-30 DIAGNOSIS — L280 Lichen simplex chronicus: Secondary | ICD-10-CM | POA: Diagnosis not present

## 2012-06-30 DIAGNOSIS — L570 Actinic keratosis: Secondary | ICD-10-CM | POA: Diagnosis not present

## 2012-07-21 DIAGNOSIS — L280 Lichen simplex chronicus: Secondary | ICD-10-CM | POA: Diagnosis not present

## 2012-07-21 DIAGNOSIS — L570 Actinic keratosis: Secondary | ICD-10-CM | POA: Diagnosis not present

## 2012-10-05 DIAGNOSIS — H353190 Nonexudative age-related macular degeneration, unspecified eye, stage unspecified: Secondary | ICD-10-CM | POA: Diagnosis not present

## 2012-10-05 DIAGNOSIS — Z961 Presence of intraocular lens: Secondary | ICD-10-CM | POA: Diagnosis not present

## 2012-12-07 ENCOUNTER — Other Ambulatory Visit: Payer: Self-pay

## 2013-02-03 ENCOUNTER — Emergency Department (HOSPITAL_COMMUNITY): Payer: Medicare Other

## 2013-02-03 ENCOUNTER — Encounter (HOSPITAL_COMMUNITY): Payer: Self-pay | Admitting: Emergency Medicine

## 2013-02-03 ENCOUNTER — Inpatient Hospital Stay (HOSPITAL_COMMUNITY)
Admission: EM | Admit: 2013-02-03 | Discharge: 2013-02-04 | DRG: 192 | Disposition: A | Discharge status: Home / Self Care | Payer: Medicare Other | Attending: Internal Medicine | Admitting: Internal Medicine

## 2013-02-03 DIAGNOSIS — Z87891 Personal history of nicotine dependence: Secondary | ICD-10-CM

## 2013-02-03 DIAGNOSIS — R059 Cough, unspecified: Secondary | ICD-10-CM | POA: Diagnosis not present

## 2013-02-03 DIAGNOSIS — I10 Essential (primary) hypertension: Secondary | ICD-10-CM | POA: Diagnosis present

## 2013-02-03 DIAGNOSIS — F411 Generalized anxiety disorder: Secondary | ICD-10-CM | POA: Diagnosis present

## 2013-02-03 DIAGNOSIS — IMO0001 Reserved for inherently not codable concepts without codable children: Secondary | ICD-10-CM | POA: Diagnosis present

## 2013-02-03 DIAGNOSIS — F3289 Other specified depressive episodes: Secondary | ICD-10-CM | POA: Diagnosis not present

## 2013-02-03 DIAGNOSIS — R9431 Abnormal electrocardiogram [ECG] [EKG]: Secondary | ICD-10-CM | POA: Diagnosis present

## 2013-02-03 DIAGNOSIS — R0902 Hypoxemia: Secondary | ICD-10-CM | POA: Diagnosis present

## 2013-02-03 DIAGNOSIS — R5381 Other malaise: Secondary | ICD-10-CM | POA: Diagnosis not present

## 2013-02-03 DIAGNOSIS — H3530 Unspecified macular degeneration: Secondary | ICD-10-CM | POA: Diagnosis present

## 2013-02-03 DIAGNOSIS — R0602 Shortness of breath: Secondary | ICD-10-CM | POA: Diagnosis not present

## 2013-02-03 DIAGNOSIS — M779 Enthesopathy, unspecified: Secondary | ICD-10-CM

## 2013-02-03 DIAGNOSIS — R05 Cough: Secondary | ICD-10-CM | POA: Diagnosis not present

## 2013-02-03 DIAGNOSIS — F329 Major depressive disorder, single episode, unspecified: Secondary | ICD-10-CM | POA: Diagnosis present

## 2013-02-03 DIAGNOSIS — Z79899 Other long term (current) drug therapy: Secondary | ICD-10-CM | POA: Diagnosis not present

## 2013-02-03 DIAGNOSIS — J4489 Other specified chronic obstructive pulmonary disease: Secondary | ICD-10-CM | POA: Diagnosis not present

## 2013-02-03 DIAGNOSIS — J441 Chronic obstructive pulmonary disease with (acute) exacerbation: Principal | ICD-10-CM | POA: Diagnosis present

## 2013-02-03 DIAGNOSIS — M129 Arthropathy, unspecified: Secondary | ICD-10-CM | POA: Diagnosis present

## 2013-02-03 DIAGNOSIS — J449 Chronic obstructive pulmonary disease, unspecified: Secondary | ICD-10-CM | POA: Diagnosis not present

## 2013-02-03 DIAGNOSIS — R5383 Other fatigue: Secondary | ICD-10-CM | POA: Diagnosis not present

## 2013-02-03 LAB — CBC WITH DIFFERENTIAL/PLATELET
BASOS PCT: 0 % (ref 0–1)
Basophils Absolute: 0 10*3/uL (ref 0.0–0.1)
Eosinophils Absolute: 0 10*3/uL (ref 0.0–0.7)
Eosinophils Relative: 0 % (ref 0–5)
HEMATOCRIT: 40 % (ref 36.0–46.0)
HEMOGLOBIN: 13.2 g/dL (ref 12.0–15.0)
LYMPHS ABS: 2.3 10*3/uL (ref 0.7–4.0)
Lymphocytes Relative: 23 % (ref 12–46)
MCH: 29.5 pg (ref 26.0–34.0)
MCHC: 33 g/dL (ref 30.0–36.0)
MCV: 89.3 fL (ref 78.0–100.0)
MONO ABS: 0.8 10*3/uL (ref 0.1–1.0)
MONOS PCT: 8 % (ref 3–12)
NEUTROS ABS: 7 10*3/uL (ref 1.7–7.7)
NEUTROS PCT: 69 % (ref 43–77)
Platelets: 292 10*3/uL (ref 150–400)
RBC: 4.48 MIL/uL (ref 3.87–5.11)
RDW: 12.4 % (ref 11.5–15.5)
WBC: 10.1 10*3/uL (ref 4.0–10.5)

## 2013-02-03 LAB — TROPONIN I: Troponin I: <0.3 ng/mL (ref <0.30)

## 2013-02-03 LAB — URINE MICROSCOPIC-ADD ON

## 2013-02-03 LAB — COMPREHENSIVE METABOLIC PANEL
ALK PHOS: 86 U/L (ref 39–117)
ALT: 11 U/L (ref 0–35)
AST: 18 U/L (ref 0–37)
Albumin: 3.6 g/dL (ref 3.5–5.2)
BILIRUBIN TOTAL: 0.5 mg/dL (ref 0.3–1.2)
BUN: 10 mg/dL (ref 6–23)
CHLORIDE: 102 meq/L (ref 96–112)
CO2: 22 meq/L (ref 19–32)
CREATININE: 0.61 mg/dL (ref 0.50–1.10)
Calcium: 8.9 mg/dL (ref 8.4–10.5)
GLUCOSE: 141 mg/dL — AB (ref 70–99)
POTASSIUM: 3.6 meq/L — AB (ref 3.7–5.3)
Sodium: 143 mEq/L (ref 137–147)
Total Protein: 7 g/dL (ref 6.0–8.3)

## 2013-02-03 LAB — URINALYSIS, ROUTINE W REFLEX MICROSCOPIC
Glucose, UA: NEGATIVE mg/dL
Hgb urine dipstick: NEGATIVE
KETONES UR: 40 mg/dL — AB
NITRITE: NEGATIVE
PH: 7.5 (ref 5.0–8.0)
PROTEIN: NEGATIVE mg/dL
Specific Gravity, Urine: 1.018 (ref 1.005–1.030)
Urobilinogen, UA: 1 mg/dL (ref 0.0–1.0)

## 2013-02-03 LAB — INFLUENZA PANEL BY PCR (TYPE A & B)
H1N1 flu by pcr: NOT DETECTED
INFLBPCR: NEGATIVE
Influenza A By PCR: NEGATIVE

## 2013-02-03 LAB — PRO B NATRIURETIC PEPTIDE: PRO B NATRI PEPTIDE: 590.1 pg/mL — AB (ref 0–125)

## 2013-02-03 MED ORDER — SODIUM CHLORIDE 0.9 % IV SOLN: 250.0000 mL | INTRAVENOUS | Status: DC | PRN

## 2013-02-03 MED ORDER — ENOXAPARIN SODIUM 40 MG/0.4ML ~~LOC~~ SOLN
40.0000 mg | SUBCUTANEOUS | Status: DC
  Administered 2013-02-03: 40 mg via SUBCUTANEOUS
  Filled 2013-02-03 (×3): qty 0.4

## 2013-02-03 MED ORDER — METOPROLOL TARTRATE 50 MG PO TABS
50.0000 mg | ORAL_TABLET | Freq: Every day | ORAL | Status: DC
  Administered 2013-02-03 – 2013-02-04 (×2): 50 mg via ORAL
  Filled 2013-02-03 (×2): qty 1

## 2013-02-03 MED ORDER — MORPHINE SULFATE 2 MG/ML IJ SOLN: 1.0000 mg | INTRAMUSCULAR | Status: DC | PRN

## 2013-02-03 MED ORDER — LORAZEPAM 2 MG/ML IJ SOLN
0.5000 mg | Freq: Once | INTRAMUSCULAR | Status: AC
  Administered 2013-02-03: 0.5 mg via INTRAVENOUS
  Filled 2013-02-03 (×2): qty 1

## 2013-02-03 MED ORDER — IPRATROPIUM BROMIDE 0.02 % IN SOLN
0.5000 mg | Freq: Four times a day (QID) | RESPIRATORY_TRACT | Status: DC
  Administered 2013-02-03: 0.5 mg via RESPIRATORY_TRACT
  Filled 2013-02-03: qty 2.5

## 2013-02-03 MED ORDER — LEVOFLOXACIN IN D5W 750 MG/150ML IV SOLN
750.0000 mg | Freq: Once | INTRAVENOUS | Status: AC
  Administered 2013-02-03: 750 mg via INTRAVENOUS
  Filled 2013-02-03: qty 150

## 2013-02-03 MED ORDER — SODIUM CHLORIDE 0.9 % IJ SOLN
3.0000 mL | Freq: Two times a day (BID) | INTRAMUSCULAR | Status: DC
  Administered 2013-02-04: 3 mL via INTRAVENOUS

## 2013-02-03 MED ORDER — DIAZEPAM 5 MG PO TABS
10.0000 mg | ORAL_TABLET | Freq: Four times a day (QID) | ORAL | Status: DC | PRN
  Administered 2013-02-03 – 2013-02-04 (×2): 10 mg via ORAL
  Filled 2013-02-03 (×3): qty 2

## 2013-02-03 MED ORDER — ONDANSETRON HCL 4 MG/2ML IJ SOLN: 4.0000 mg | Freq: Four times a day (QID) | INTRAMUSCULAR | Status: DC | PRN

## 2013-02-03 MED ORDER — PAROXETINE HCL 20 MG PO TABS
40.0000 mg | ORAL_TABLET | ORAL | Status: DC
  Administered 2013-02-04: 40 mg via ORAL
  Filled 2013-02-03 (×2): qty 2

## 2013-02-03 MED ORDER — SODIUM CHLORIDE 0.9 % IJ SOLN: 3.0000 mL | Freq: Two times a day (BID) | INTRAMUSCULAR | Status: DC

## 2013-02-03 MED ORDER — ACETAMINOPHEN 325 MG PO TABS: 650.0000 mg | ORAL_TABLET | Freq: Four times a day (QID) | ORAL | Status: DC | PRN

## 2013-02-03 MED ORDER — GUAIFENESIN-DM 100-10 MG/5ML PO SYRP
5.0000 mL | ORAL_SOLUTION | ORAL | Status: DC | PRN
  Administered 2013-02-04: 5 mL via ORAL
  Filled 2013-02-03: qty 10

## 2013-02-03 MED ORDER — METOPROLOL TARTRATE 50 MG PO TABS
50.0000 mg | ORAL_TABLET | Freq: Every day | ORAL | Status: DC
Start: 2013-02-03 — End: 2013-02-03

## 2013-02-03 MED ORDER — SODIUM CHLORIDE 0.9 % IJ SOLN: 3.0000 mL | INTRAMUSCULAR | Status: DC | PRN

## 2013-02-03 MED ORDER — ONDANSETRON HCL 4 MG PO TABS: 4.0000 mg | ORAL_TABLET | Freq: Four times a day (QID) | ORAL | Status: DC | PRN

## 2013-02-03 MED ORDER — LEVOFLOXACIN 500 MG PO TABS
500.0000 mg | ORAL_TABLET | Freq: Every day | ORAL | Status: DC
  Administered 2013-02-04: 500 mg via ORAL
  Filled 2013-02-03: qty 1

## 2013-02-03 MED ORDER — LEVALBUTEROL HCL 0.63 MG/3ML IN NEBU
0.6300 mg | INHALATION_SOLUTION | Freq: Four times a day (QID) | RESPIRATORY_TRACT | Status: DC
  Administered 2013-02-03: 0.63 mg via RESPIRATORY_TRACT
  Filled 2013-02-03 (×2): qty 3

## 2013-02-03 MED ORDER — METHYLPREDNISOLONE SODIUM SUCC 40 MG IJ SOLR
40.0000 mg | Freq: Two times a day (BID) | INTRAMUSCULAR | Status: DC
  Administered 2013-02-03 – 2013-02-04 (×2): 40 mg via INTRAVENOUS
  Filled 2013-02-03 (×5): qty 1

## 2013-02-03 MED ORDER — ACETAMINOPHEN 650 MG RE SUPP: 650.0000 mg | Freq: Four times a day (QID) | RECTAL | Status: DC | PRN

## 2013-02-03 NOTE — ED Notes (Signed)
Bed: [id number] Expected date: 02/03/13 Expected time: 11:29 AM Means of arrival: Ambulance Comments: COPD wheezing bil 2nd treatment

## 2013-02-03 NOTE — ED Provider Notes (Signed)
CSN: [id number]     Arrival date & time 02/03/13  1126 History   First MD Initiated Contact with Patient 02/03/13 1149     Chief Complaint  Patient presents with  . Shortness of Breath  . Cough   (Consider location/radiation/quality/duration/timing/severity/associated sxs/prior Treatment) HPI Comments: Patient presents to the ER for evaluation of cough and difficulty breathing. Patient has been sick for more than one week. Patient's grandson recently came to visit and had a fever when he arrived. Several days later she started having symptoms. Patient does have a history of COPD. She has had progressively worsening shortness of breath. Fever has been as high as 102. Patient arrives by EMS. She was given Solu-Medrol 125 mg and albuterol 10 mg, Atrovent 0.5 mg during transport.  Patient is a 63 y.o. female presenting with shortness of breath and cough.  Shortness of Breath Associated symptoms: cough and fever   Cough Associated symptoms: fever and shortness of breath     Past Medical History  Diagnosis Date  . Arthritis   . Depression   . Seizures   . Cataract   . Anxiety   . COPD (chronic obstructive pulmonary disease)   . Fibromyalgia   . Macular degeneration    Past Surgical History  Procedure Laterality Date  . Abdominal hysterectomy     Family History  Problem Relation Age of Onset  . Emphysema Mother   . Arthritis Father   . Arthritis Sister   . Joint hypermobility Brother    History  Substance Use Topics  . Smoking status: Former Smoker -- 1.00 packs/day for 35 years    Types: Cigarettes    Quit date: 01/27/2013  . Smokeless tobacco: Not on file  . Alcohol Use: No   OB History   Grav Para Term Preterm Abortions TAB SAB Ect Mult Living                 Review of Systems  Constitutional: Positive for fever and fatigue.  Respiratory: Positive for cough and shortness of breath.   All other systems reviewed and are negative.    Allergies  Sulfa  antibiotics  Home Medications   Current Outpatient Rx  Name  Route  Sig  Dispense  Refill  . amoxicillin (AMOXIL) 500 MG tablet   Oral   Take 1 tablet (500 mg total) by mouth 3 (three) times daily.   21 tablet   0   . ciprofloxacin (CIPRO) 500 MG tablet   Oral   Take 1 tablet (500 mg total) by mouth 2 (two) times daily.   10 tablet   0   . diazepam (VALIUM) 10 MG tablet   Oral   Take 10 mg by mouth every 6 (six) hours as needed.         Marland Kitchen HYDROcodone-acetaminophen (NORCO) 7.5-325 MG per tablet   Oral   Take 1 tablet by mouth every 8 (eight) hours as needed for pain.   30 tablet   0     Do not exceed 3 times daily.   . metoprolol (LOPRESSOR) 50 MG tablet   Oral   Take 1 tablet (50 mg total) by mouth daily.   90 tablet   3   . PARoxetine (PAXIL) 40 MG tablet   Oral   Take 40 mg by mouth every morning.          BP 113/82  Pulse 87  Temp(Src) 98 F (36.7 C) (Oral)  Resp 24  SpO2 98% Physical  Exam  Constitutional: She is oriented to person, place, and time. She appears well-developed and well-nourished. No distress.  HENT:  Head: Normocephalic and atraumatic.  Right Ear: Hearing normal.  Left Ear: Hearing normal.  Nose: Nose normal.  Mouth/Throat: Oropharynx is clear and moist and mucous membranes are normal.  Eyes: Conjunctivae and EOM are normal. Pupils are equal, round, and reactive to light.  Neck: Normal range of motion. Neck supple.  Cardiovascular: Regular rhythm, S1 normal and S2 normal.  Exam reveals no gallop and no friction rub.   No murmur heard. Pulmonary/Chest: Accessory muscle usage present. Tachypnea noted. No respiratory distress. She has decreased breath sounds. She has wheezes. She has rhonchi. She exhibits no tenderness.  Abdominal: Soft. Normal appearance and bowel sounds are normal. There is no hepatosplenomegaly. There is no tenderness. There is no rebound, no guarding, no tenderness at McBurney's point and negative Murphy's sign. No  hernia.  Musculoskeletal: Normal range of motion.  Neurological: She is alert and oriented to person, place, and time. She has normal strength. No cranial nerve deficit or sensory deficit. Coordination normal. GCS eye subscore is 4. GCS verbal subscore is 5. GCS motor subscore is 6.  Skin: Skin is warm, dry and intact. No rash noted. No cyanosis.  Psychiatric: She has a normal mood and affect. Her speech is normal and behavior is normal. Thought content normal.    ED Course  Procedures (including critical care time) Labs Review Labs Reviewed  COMPREHENSIVE METABOLIC PANEL - Abnormal; Notable for the following:    Potassium 3.6 (*)    Glucose, Bld 141 (*)    All other components within normal limits  PRO B NATRIURETIC PEPTIDE - Abnormal; Notable for the following:    Pro B Natriuretic peptide (BNP) 590.1 (*)    All other components within normal limits  CULTURE, BLOOD (ROUTINE X 2)  CULTURE, BLOOD (ROUTINE X 2)  CBC WITH DIFFERENTIAL  TROPONIN I  URINALYSIS, ROUTINE W REFLEX MICROSCOPIC  INFLUENZA PANEL BY PCR   Imaging Review Dg Chest 2 View  02/03/2013   CLINICAL DATA:  fever, cough  EXAM: CHEST  2 VIEW  COMPARISON:  CHEST dated 03/27/2008  FINDINGS: The heart size and mediastinal contours are within normal limits. Both lungs are clear. The visualized skeletal structures are unremarkable.  IMPRESSION: No active cardiopulmonary disease.   Electronically Signed   By: Elige KoHetal  Patel   On: 02/03/2013 13:10    EKG Interpretation    Date/Time:  Saturday February 03 2013 11:46:50 EST Ventricular Rate:  89 PR Interval:  162 QRS Duration: 81 QT Interval:  410 QTC Calculation: 499 R Axis:   38 Text Interpretation:  Sinus rhythm Nonspecific T abnrm, anterolateral leads Borderline prolonged QT interval ST/T depression in inf-lat leads is new Confirmed by Xandria Gallaga  MD, Koran Seabrook ([id number]) on 02/03/2013 12:14:21 PM            MDM  Diagnosis: COPD exacerbation  Patient presents to the ER  for evaluation of cough and difficulty breathing. Patient has previous history of COPD. She has been using her bronchodilator therapy at home without any improvement. EMS brought the patient to the ER, her premorbid continuous nebulizers and Solu-Medrol prior to arrival. Patient still is having bronchospasm and mild respiratory distress on arrival.  Chest x-ray is unremarkable, no sign of acute pneumonia. Cardiac workup was performed. This was because of her significant dyspnea. She is not experiencing any chest pain. She did have diffuse ST changes in the inferior lateral  leads. This is new compared to March 2001. There are no more recent tracings. We'll likely need to cycle enzymes, but do not suspect that this is ongoing ischemia.  Patient still having significant bronchospasm after treatment. She did desat to 89% while resting in bed off oxygen. She does not use oxygen at home. Patient placed on nasal canula oxygen and will require hospitalization for further treatment.  Gilda Crease, MD 02/03/13 [phone]

## 2013-02-03 NOTE — ED Notes (Signed)
Pt from home reports that she has had dry, non-productive cough with SOB x1 week. Pt has hx of COPD and has used home inhaler with no relief. Pt denies other s/sx. Pt is A&O and in NAD

## 2013-02-03 NOTE — H&P (Signed)
[facility] History and Physical  TYRESHA FEDE ZOX:[id number] DOB: 1950-08-21 DOA: 02/03/2013  Referring physician: Dorthy Cooler, ER physician [facility]CP: [facility]  Chief Complaint: Shortness of breath  HPI: Jasmin Munoz is a 63 y.o. female  Past medical history COPD, anxiety and hypertension who earlier this week was seeing her grandson had a cold and fever. Since that time patient reports febrile illness of worsening shortness of breath. She feels quite anxious. She got the point where she could not take it anymore and she came into the emergency room.  In the emergency room, patient was noted to have normal white count with no shift and preliminary blood work normal. She was noted to be mildly hypoxic with oxygen saturations at 89% on room air. EKG was done which did note ST depressions in inferior leads. Patient felt better with oxygen. Patient started on antibiotics. Hospitalists were called for admission.   Review of Systems:  Patient seen after arrival to floor. In general, she feels quite anxious. She feels like she is hurting all over, but her main complaint is shortness of breath. She feels quite anxious. She complains of a mild headache. Denies any vision changes, dysphasia, chest pain or palpitations. She does feel short of breath, although better with oxygen. She is complaining of wheezing and nonproductive cough. She denies abdominal pain, hematuria, dysuria, constipation, diarrhea, focal extremity numbness or weakness or pain. Review systems is otherwise negative.  Past Medical History  Diagnosis Date  . Arthritis   . Depression   . Seizures   . Cataract   . Anxiety   . COPD (chronic obstructive pulmonary disease)   . Fibromyalgia   . Macular degeneration    Past Surgical History  Procedure Laterality Date  . Abdominal hysterectomy     Social History:  reports that she quit smoking 7 days ago. Her smoking use included Cigarettes. She has a 35 pack-year  smoking history. She does not have any smokeless tobacco history on file. She reports that she does not drink alcohol or use illicit drugs.  Allergies  Allergen Reactions  . Sulfa Antibiotics     Unknown doesn't remember     Family History  Problem Relation Age of Onset  . Emphysema Mother   . Arthritis Father   . Arthritis Sister   . Joint hypermobility Brother      Prior to Admission medications   Medication Sig Start Date End Date Taking? Authorizing Provider  albuterol (PROVENTIL HFA;VENTOLIN HFA) 108 (90 BASE) MCG/ACT inhaler Inhale 1 puff into the lungs every 6 (six) hours as needed for wheezing or shortness of breath.   Yes Historical Provider, MD  diazepam (VALIUM) 10 MG tablet Take 10 mg by mouth every 6 (six) hours as needed for anxiety.    Yes Historical Provider, MD  metoprolol (LOPRESSOR) 50 MG tablet Take 1 tablet (50 mg total) by mouth daily. 02/22/12  Yes Elvina Sidle, MD  PARoxetine (PAXIL) 40 MG tablet Take 40 mg by mouth every morning.   Yes Historical Provider, MD   Physical Exam: Filed Vitals:   02/03/13 1944  BP:   Pulse: 67  Temp:   Resp: 18    BP 103/62  Pulse 67  Temp(Src) 98.2 F (36.8 C) (Oral)  Resp 18  Ht 5\' 5"  (1.651 m)  Wt 55.7 kg (122 lb 12.7 oz)  BMI 20.43 kg/m2  SpO2 94%  General: Anxious, mild distress secondary to shortness of breath. HEENT: Normocephalic, atraumatic, mucous membranes are  slightly I Cardiovascular: Regular rate and rhythm, borderline tachycardia Lungs: Decreased breath sounds throughout, mild prolonged expiratory phase with left lower lobe expiratory wheezing. Abdomen: Soft, nontender, nondistended, positive bowel sounds Extremities: No clubbing or cyanosis or edema Psychiatric: Anxious, otherwise appropriate. No evidence of psychoses Skin: No skin breaks, tears or lesions Neurologic: No focal deficits           Labs on Admission:  Basic Metabolic Panel:  Recent Labs Lab 02/03/13 1219  NA 143  K 3.6*   CL 102  CO2 22  GLUCOSE 141*  BUN 10  CREATININE 0.61  CALCIUM 8.9   Liver Function Tests:  Recent Labs Lab 02/03/13 1219  AST 18  ALT 11  ALKPHOS 86  BILITOT 0.5  PROT 7.0  ALBUMIN 3.6   No results found for this basename: LIPASE, AMYLASE,  in the last 168 hours No results found for this basename: AMMONIA,  in the last 168 hours CBC:  Recent Labs Lab 02/03/13 1219  WBC 10.1  NEUTROABS 7.0  HGB 13.2  HCT 40.0  MCV 89.3  PLT 292   Cardiac Enzymes:  Recent Labs Lab 02/03/13 1219 02/03/13 1910  TROPONINI <0.30 <0.30    BNP (last 3 results)  Recent Labs  02/03/13 1220  PROBNP 590.1*   CBG: No results found for this basename: GLUCAP,  in the last 168 hours  Radiological Exams on Admission: Dg Chest 2 View  02/03/2013   CLINICAL DATA:  fever, cough  EXAM: CHEST  2 VIEW  COMPARISON:  CHEST dated 03/27/2008  FINDINGS: The heart size and mediastinal contours are within normal limits. Both lungs are clear. The visualized skeletal structures are unremarkable.  IMPRESSION: No active cardiopulmonary disease.   Electronically Signed   By: Elige KoHetal  Patel   On: 02/03/2013 13:10    EKG: Independently reviewed. Normal sinus rhythm with nonspecific T-wave abnormalities, however noted ST depressions in inferior leads  Assessment/Plan Principal Problem:   COPD exacerbation: Flu PCR negative. She with steroids plus nebulizers plus oxygen plus antibiotics. Overall relatively stable,. We'll check an ambulatory pulse ox tomorrow Active Problems:   DEPRESSION: Continue home meds   Hypoxemia: On oxygen.   Generalized anxiety disorder: Continue home Valium   HTN (hypertension): Continue home meds Abnormal EKG: Repeat EKG in the morning and cycled troponins   Code Status: Full code  Family Communication: Plan discussed with husband at the bedside.  Disposition Plan: Could potentially go home tomorrow or the next day  Time spent: 35 minutes  Hollice EspyKRISHNAN,Latanza Pfefferkorn K [facility] Pager [phone]

## 2013-02-03 NOTE — ED Notes (Addendum)
Pt from home via EMS. Per EMS,pt has hx of COPD, SOB x1 week. Pt used inhaler at home with no relief. Pt received 10 Albuterol, 0.5 Atrovent and 125 Solumedrol en route ( on second tx upon arrival) Per EMS, pt SR with occ PAC. Pt A&O and in NAD

## 2013-02-03 NOTE — ED Notes (Signed)
Md notified patient is anxious and states she is having hard time breathing. Patient oxygen level 95% at this time RR 20.

## 2013-02-04 DIAGNOSIS — R0902 Hypoxemia: Secondary | ICD-10-CM | POA: Diagnosis not present

## 2013-02-04 DIAGNOSIS — F411 Generalized anxiety disorder: Secondary | ICD-10-CM | POA: Diagnosis not present

## 2013-02-04 DIAGNOSIS — I10 Essential (primary) hypertension: Secondary | ICD-10-CM

## 2013-02-04 DIAGNOSIS — J441 Chronic obstructive pulmonary disease with (acute) exacerbation: Secondary | ICD-10-CM | POA: Diagnosis not present

## 2013-02-04 DIAGNOSIS — J449 Chronic obstructive pulmonary disease, unspecified: Secondary | ICD-10-CM | POA: Diagnosis not present

## 2013-02-04 DIAGNOSIS — F3289 Other specified depressive episodes: Secondary | ICD-10-CM

## 2013-02-04 DIAGNOSIS — J4489 Other specified chronic obstructive pulmonary disease: Secondary | ICD-10-CM

## 2013-02-04 DIAGNOSIS — F329 Major depressive disorder, single episode, unspecified: Secondary | ICD-10-CM | POA: Diagnosis not present

## 2013-02-04 LAB — CBC
HEMATOCRIT: 35.3 % — AB (ref 36.0–46.0)
Hemoglobin: 11.8 g/dL — ABNORMAL LOW (ref 12.0–15.0)
MCH: 29.7 pg (ref 26.0–34.0)
MCHC: 33.4 g/dL (ref 30.0–36.0)
MCV: 88.9 fL (ref 78.0–100.0)
Platelets: 288 10*3/uL (ref 150–400)
RBC: 3.97 MIL/uL (ref 3.87–5.11)
RDW: 12.6 % (ref 11.5–15.5)
WBC: 12.3 10*3/uL — ABNORMAL HIGH (ref 4.0–10.5)

## 2013-02-04 LAB — BASIC METABOLIC PANEL
BUN: 17 mg/dL (ref 6–23)
CHLORIDE: 101 meq/L (ref 96–112)
CO2: 22 mEq/L (ref 19–32)
Calcium: 9.1 mg/dL (ref 8.4–10.5)
Creatinine, Ser: 0.67 mg/dL (ref 0.50–1.10)
GFR calc Af Amer: >90 mL/min (ref >90)
GFR calc non Af Amer: >90 mL/min (ref >90)
Glucose, Bld: 127 mg/dL — ABNORMAL HIGH (ref 70–99)
Potassium: 4.1 mEq/L (ref 3.7–5.3)
Sodium: 140 mEq/L (ref 137–147)

## 2013-02-04 LAB — TROPONIN I: Troponin I: <0.3 ng/mL (ref <0.30)

## 2013-02-04 MED ORDER — PREDNISONE 50 MG PO TABS: ORAL_TABLET | ORAL | Status: DC

## 2013-02-04 MED ORDER — LEVOFLOXACIN 500 MG PO TABS: 500.0000 mg | ORAL_TABLET | Freq: Every day | ORAL | Status: DC

## 2013-02-04 MED ORDER — IPRATROPIUM BROMIDE 0.02 % IN SOLN
0.5000 mg | Freq: Three times a day (TID) | RESPIRATORY_TRACT | Status: DC
  Administered 2013-02-04 (×2): 0.5 mg via RESPIRATORY_TRACT
  Filled 2013-02-04 (×2): qty 2.5

## 2013-02-04 MED ORDER — LEVALBUTEROL HCL 0.63 MG/3ML IN NEBU
0.6300 mg | INHALATION_SOLUTION | Freq: Three times a day (TID) | RESPIRATORY_TRACT | Status: DC
  Administered 2013-02-04 (×2): 0.63 mg via RESPIRATORY_TRACT
  Filled 2013-02-04 (×5): qty 3

## 2013-02-04 NOTE — Discharge Summary (Signed)
Physician Discharge Summary  Jasmin Munoz EAV:[id number] DOB: 12/20/1950 DOA: 02/03/2013  PCP: Provider Not In System  Admit date: 02/03/2013 Discharge date: 02/04/2013  Time spent: > 35 minutes  Recommendations for Outpatient Follow-up:  1. Please be sure to followup with respiratory status and consider pulmonary function testing once breathing back to baseline. 2. Reassess CBC on next followup to ensure the patient's white blood cell count continues to trend down. Most likely elevated secondary to systemic steroids of which I plan a short 5 day course total of steroids  Discharge Diagnoses:  Principal Problem:   COPD exacerbation Active Problems:   DEPRESSION   Hypoxemia   Generalized anxiety disorder   HTN (hypertension)   Nonspecific abnormal electrocardiogram (ECG) (EKG)   Discharge Condition: Stable  Diet recommendation: Low sodium heart healthy  Filed Weights   02/03/13 1645  Weight: 55.7 kg (122 lb 12.7 oz)    History of present illness:  Patient is a 63 year old with history of COPD, anxiety and hypertension with recent exposure to sick contact. Presented to the ED complaining of shortness of breath. Was found to have oxygen saturations at 89% on room air. EKG findings showed ST depression in inferior leads and as such it was decided to observe patient overnight and routine cardiac testing.  Hospital Course:  Principal Problem:   COPD exacerbation - Most likely secondary to recent sick contact exposure. Will discharge on Levaquin. To complete a seven-day course - Patient on ambulation has oxygen saturation of 90-94% without oxygen - Currently stable with mild wheeze as such will discharge a short course of oral prednisone - Once patient breathing at baseline the patient should be considered for pulmonary function testing for further recommendations  Active Problems:   DEPRESSION - Stable continue home regimen   Hypoxemia - Resolved and most likely secondary #1  Generalized anxiety disorder - Stable continue home regimen    HTN (hypertension) - We'll plan on continuing metoprolol at this juncture.    Nonspecific abnormal electrocardiogram (ECG) (EKG) - Repeat EKG unchanged and troponins x 3 negative. Patient to followup with primary care physician as outpatient  Procedures:  None  Consultations:  None  Discharge Exam: Filed Vitals:   02/04/13 1015  BP: 122/74  Pulse: 71  Temp:   Resp:     General: Pt in NAD, Alert and Awake Cardiovascular: RRR, no MRG Respiratory: mild expiratory wheeze BL, no rhales, breathing comfortably on room air.  Discharge Instructions  Discharge Orders   Future Orders Complete By Expires   Call MD for:  difficulty breathing, headache or visual disturbances  As directed    Call MD for:  temperature >100.4  As directed    Diet - low sodium heart healthy  As directed    Discharge instructions  As directed    Comments:     Please followup with your primary care physician within the next one or 2 weeks for further evaluation and recommendations. Feel free to followup sooner should any new concerns arise.   Increase activity slowly  As directed        Medication List         albuterol 108 (90 BASE) MCG/ACT inhaler  Commonly known as:  PROVENTIL HFA;VENTOLIN HFA  Inhale 1 puff into the lungs every 6 (six) hours as needed for wheezing or shortness of breath.     diazepam 10 MG tablet  Commonly known as:  VALIUM  Take 10 mg by mouth every 6 (six) hours as needed  for anxiety.     levofloxacin 500 MG tablet  Commonly known as:  LEVAQUIN  Take 1 tablet (500 mg total) by mouth daily.     metoprolol 50 MG tablet  Commonly known as:  LOPRESSOR  Take 1 tablet (50 mg total) by mouth daily.     PARoxetine 40 MG tablet  Commonly known as:  PAXIL  Take 40 mg by mouth every morning.     predniSONE 50 MG tablet  Commonly known as:  DELTASONE  Take 1 tablet once a day       Allergies  Allergen  Reactions  . Sulfa Antibiotics     Unknown doesn't remember       The results of significant diagnostics from this hospitalization (including imaging, microbiology, ancillary and laboratory) are listed below for reference.    Significant Diagnostic Studies: Dg Chest 2 View  02/03/2013   CLINICAL DATA:  fever, cough  EXAM: CHEST  2 VIEW  COMPARISON:  CHEST dated 03/27/2008  FINDINGS: The heart size and mediastinal contours are within normal limits. Both lungs are clear. The visualized skeletal structures are unremarkable.  IMPRESSION: No active cardiopulmonary disease.   Electronically Signed   By: Elige Ko   On: 02/03/2013 13:10    Microbiology: Recent Results (from the past 240 hour(s))  CULTURE, BLOOD (ROUTINE X 2)     Status: None   Collection Time    02/03/13 12:28 PM      Result Value Range Status   Specimen Description BLOOD RIGHT HAND   Final   Special Requests BOTTLES DRAWN AEROBIC AND ANAEROBIC 5CC   Final   Culture  Setup Time     Final   Value: 02/03/2013 18:10     Performed at [facility]   Culture     Final   Value:        BLOOD CULTURE RECEIVED NO GROWTH TO DATE CULTURE WILL BE HELD FOR 5 DAYS BEFORE ISSUING A FINAL NEGATIVE REPORT     Performed at [facility]   Report Status PENDING   Incomplete  CULTURE, BLOOD (ROUTINE X 2)     Status: None   Collection Time    02/03/13  1:10 PM      Result Value Range Status   Specimen Description BLOOD   Final   Special Requests BOTTLES DRAWN AEROBIC AND ANAEROBIC 5CC   Final   Culture  Setup Time     Final   Value: 02/03/2013 18:35     Performed at [facility]   Culture     Final   Value:        BLOOD CULTURE RECEIVED NO GROWTH TO DATE CULTURE WILL BE HELD FOR 5 DAYS BEFORE ISSUING A FINAL NEGATIVE REPORT     Performed at [facility]   Report Status PENDING   Incomplete     Labs: Basic Metabolic Panel:  Recent Labs Lab 02/03/13 1219 02/04/13 0512  NA 143 140  K 3.6* 4.1  CL  102 101  CO2 22 22  GLUCOSE 141* 127*  BUN 10 17  CREATININE 0.61 0.67  CALCIUM 8.9 9.1   Liver Function Tests:  Recent Labs Lab 02/03/13 1219  AST 18  ALT 11  ALKPHOS 86  BILITOT 0.5  PROT 7.0  ALBUMIN 3.6   No results found for this basename: LIPASE, AMYLASE,  in the last 168 hours No results found for this basename: AMMONIA,  in the last 168 hours CBC:  Recent Labs Lab 02/03/13 1219 02/04/13 0512  WBC 10.1 12.3*  NEUTROABS 7.0  --   HGB 13.2 11.8*  HCT 40.0 35.3*  MCV 89.3 88.9  PLT 292 288   Cardiac Enzymes:  Recent Labs Lab 02/03/13 1219 02/03/13 1910 02/03/13 2345 02/04/13 0512  TROPONINI <0.30 <0.30 <0.30 <0.30   BNP: BNP (last 3 results)  Recent Labs  02/03/13 1220  PROBNP 590.1*   CBG: No results found for this basename: GLUCAP,  in the last 168 hours     Signed:  Penny Pia  [facility] 02/04/2013, 3:30 PM

## 2013-02-04 NOTE — Progress Notes (Signed)
Through out the night pt. Urinated just a 100cc. Bladdder scan pt. This morning and only showed 100cc. Pt stated she feels like she doesn't have to pee. MD made aware. No new orders give. MD. Stated she let the primary doctor know.

## 2013-02-04 NOTE — Progress Notes (Signed)
Discharge instructions and prescriptions reviewed with patient and daughter using teach back method. Patient discharged home

## 2013-02-04 NOTE — Progress Notes (Signed)
Patient ambulated approximately 200 feet on room air and oxygen saturation level remained 90-94%.

## 2013-02-09 LAB — CULTURE, BLOOD (ROUTINE X 2)
CULTURE: NO GROWTH
Culture: NO GROWTH

## 2013-04-19 ENCOUNTER — Other Ambulatory Visit: Payer: Self-pay | Admitting: Family Medicine

## 2013-05-23 ENCOUNTER — Ambulatory Visit (INDEPENDENT_AMBULATORY_CARE_PROVIDER_SITE_OTHER): Payer: Medicare Other | Admitting: Family Medicine

## 2013-05-23 ENCOUNTER — Encounter: Payer: Self-pay | Admitting: Family Medicine

## 2013-05-23 VITALS — BP 110/70 | HR 67 | Temp 98.2°F | Resp 16 | Ht 60.5 in | Wt 136.2 lb

## 2013-05-23 DIAGNOSIS — F411 Generalized anxiety disorder: Secondary | ICD-10-CM

## 2013-05-23 DIAGNOSIS — J449 Chronic obstructive pulmonary disease, unspecified: Secondary | ICD-10-CM | POA: Diagnosis not present

## 2013-05-23 DIAGNOSIS — J4489 Other specified chronic obstructive pulmonary disease: Secondary | ICD-10-CM

## 2013-05-23 DIAGNOSIS — K219 Gastro-esophageal reflux disease without esophagitis: Secondary | ICD-10-CM

## 2013-05-23 MED ORDER — ALBUTEROL SULFATE HFA 108 (90 BASE) MCG/ACT IN AERS: 1.0000 | INHALATION_SPRAY | Freq: Four times a day (QID) | RESPIRATORY_TRACT | Status: DC | PRN

## 2013-05-23 MED ORDER — METOPROLOL TARTRATE 50 MG PO TABS: ORAL_TABLET | ORAL | Status: DC

## 2013-05-23 MED ORDER — DIAZEPAM 10 MG PO TABS: 10.0000 mg | ORAL_TABLET | Freq: Four times a day (QID) | ORAL | Status: DC | PRN

## 2013-05-23 MED ORDER — PAROXETINE HCL 40 MG PO TABS: 40.0000 mg | ORAL_TABLET | ORAL | Status: DC

## 2013-05-23 MED ORDER — OMEPRAZOLE 20 MG PO CPDR: 20.0000 mg | DELAYED_RELEASE_CAPSULE | Freq: Every day | ORAL | Status: DC

## 2013-05-23 NOTE — Progress Notes (Signed)
Is a 63 year old woman is now taking care of her husband, Jasmin Munoz, with dementia and looking after her mother with severe COPD who is on oxygen.  Over the last year Jasmin Munoz has completely discontinued her codeine products for chronic pain. She still does have pain but she puts up with it by getting her mind off of things. She's also given up cigarettes and to episode of pneumonia in January. Overall her breathing is better. She still uses the inhaler periodically.  She needs a refill on her Paxil and her albuterol inhaler today. She notes over the last month she started having some reflux symptoms as well as difficulty swallowing. She hasn't tried anything in particular yet.  Objective: No acute distress Chest: Faint wheezes with decreased breath sounds overall. Nevertheless her breathing is improvement over the last time I saw her. Heart: Regular no murmur Extremities: No edema  We talked for about 35 minutes about her family situation. She's also helping care for her grandchildren of which is very proud.  Assessment: COPD with dramatic improvement in her medication log by discontinuing chronic pain medicine as well as smoking.  Plan:  I once again offered patient Pneumovax, flu shot, other immunizations, cancer screening but she refuses.  Anxiety state, unspecified - Plan: PARoxetine (PAXIL) 40 MG tablet  COPD, moderate - Plan: albuterol (PROVENTIL HFA;VENTOLIN HFA) 108 (90 BASE) MCG/ACT inhaler  GERD (gastroesophageal reflux disease) - Plan: omeprazole (PRILOSEC) 20 MG capsule, Ambulatory referral to Gastroenterology  Signed, Elvina SidleKurt Keyaira Clapham, MD

## 2013-05-23 NOTE — Patient Instructions (Signed)
Gastroesophageal Reflux Disease, Adult  Gastroesophageal reflux disease (GERD) happens when acid from your stomach flows up into the esophagus. When acid comes in contact with the esophagus, the acid causes soreness (inflammation) in the esophagus. Over time, GERD may create small holes (ulcers) in the lining of the esophagus.  CAUSES   · Increased body weight. This puts pressure on the stomach, making acid rise from the stomach into the esophagus.  · Smoking. This increases acid production in the stomach.  · Drinking alcohol. This causes decreased pressure in the lower esophageal sphincter (valve or ring of muscle between the esophagus and stomach), allowing acid from the stomach into the esophagus.  · Late evening meals and a full stomach. This increases pressure and acid production in the stomach.  · A malformed lower esophageal sphincter.  Sometimes, no cause is found.  SYMPTOMS   · Burning pain in the lower part of the mid-chest behind the breastbone and in the mid-stomach area. This may occur twice a week or more often.  · Trouble swallowing.  · Sore throat.  · Dry cough.  · Asthma-like symptoms including chest tightness, shortness of breath, or wheezing.  DIAGNOSIS   Your caregiver may be able to diagnose GERD based on your symptoms. In some cases, X-rays and other tests may be done to check for complications or to check the condition of your stomach and esophagus.  TREATMENT   Your caregiver may recommend over-the-counter or prescription medicines to help decrease acid production. Ask your caregiver before starting or adding any new medicines.   HOME CARE INSTRUCTIONS   · Change the factors that you can control. Ask your caregiver for guidance concerning weight loss, quitting smoking, and alcohol consumption.  · Avoid foods and drinks that make your symptoms worse, such as:  · Caffeine or alcoholic drinks.  · Chocolate.  · Peppermint or mint flavorings.  · Garlic and onions.  · Spicy foods.  · Citrus fruits,  such as oranges, lemons, or limes.  · Tomato-based foods such as sauce, chili, salsa, and pizza.  · Fried and fatty foods.  · Avoid lying down for the 3 hours prior to your bedtime or prior to taking a nap.  · Eat small, frequent meals instead of large meals.  · Wear loose-fitting clothing. Do not wear anything tight around your waist that causes pressure on your stomach.  · Raise the head of your bed 6 to 8 inches with wood blocks to help you sleep. Extra pillows will not help.  · Only take over-the-counter or prescription medicines for pain, discomfort, or fever as directed by your caregiver.  · Do not take aspirin, ibuprofen, or other nonsteroidal anti-inflammatory drugs (NSAIDs).  SEEK IMMEDIATE MEDICAL CARE IF:   · You have pain in your arms, neck, jaw, teeth, or back.  · Your pain increases or changes in intensity or duration.  · You develop nausea, vomiting, or sweating (diaphoresis).  · You develop shortness of breath, or you faint.  · Your vomit is green, yellow, black, or looks like coffee grounds or blood.  · Your stool is red, bloody, or black.  These symptoms could be signs of other problems, such as heart disease, gastric bleeding, or esophageal bleeding.  MAKE SURE YOU:   · Understand these instructions.  · Will watch your condition.  · Will get help right away if you are not doing well or get worse.  Document Released: 10/28/2004 Document Revised: 04/12/2011 Document Reviewed: 08/07/2010  Exit[employer]® Patient   Information ©2014 [employer].

## 2013-05-24 ENCOUNTER — Ambulatory Visit: Payer: Medicare Other | Admitting: Family Medicine

## 2013-06-21 ENCOUNTER — Other Ambulatory Visit: Payer: Self-pay | Admitting: Family Medicine

## 2013-09-21 DIAGNOSIS — F331 Major depressive disorder, recurrent, moderate: Secondary | ICD-10-CM | POA: Diagnosis not present

## 2013-09-24 ENCOUNTER — Ambulatory Visit (INDEPENDENT_AMBULATORY_CARE_PROVIDER_SITE_OTHER): Payer: Medicare Other | Admitting: Family Medicine

## 2013-09-24 ENCOUNTER — Encounter: Payer: Self-pay | Admitting: Family Medicine

## 2013-09-24 VITALS — BP 102/80 | HR 60 | Temp 98.6°F | Resp 16 | Ht 61.0 in | Wt 138.2 lb

## 2013-09-24 DIAGNOSIS — R110 Nausea: Secondary | ICD-10-CM

## 2013-09-24 DIAGNOSIS — S139XXA Sprain of joints and ligaments of unspecified parts of neck, initial encounter: Secondary | ICD-10-CM

## 2013-09-24 DIAGNOSIS — S161XXA Strain of muscle, fascia and tendon at neck level, initial encounter: Secondary | ICD-10-CM

## 2013-09-24 MED ORDER — METHYLPREDNISOLONE (PAK) 4 MG PO TABS: ORAL_TABLET | ORAL | Status: DC

## 2013-09-24 MED ORDER — ESOMEPRAZOLE MAGNESIUM 40 MG PO CPDR: 40.0000 mg | DELAYED_RELEASE_CAPSULE | Freq: Every day | ORAL | Status: DC

## 2013-09-24 MED ORDER — METOPROLOL TARTRATE 50 MG PO TABS: 50.0000 mg | ORAL_TABLET | Freq: Every day | ORAL | Status: DC

## 2013-09-24 NOTE — Patient Instructions (Signed)
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
## 2014-04-15 DIAGNOSIS — N390 Urinary tract infection, site not specified: Secondary | ICD-10-CM | POA: Diagnosis not present

## 2014-04-15 DIAGNOSIS — R300 Dysuria: Secondary | ICD-10-CM | POA: Diagnosis not present

## 2014-08-30 ENCOUNTER — Other Ambulatory Visit: Payer: Self-pay | Admitting: Family Medicine

## 2014-09-30 ENCOUNTER — Other Ambulatory Visit: Payer: Self-pay | Admitting: Family Medicine

## 2014-11-29 ENCOUNTER — Telehealth: Payer: Self-pay | Admitting: Family Medicine

## 2014-11-29 NOTE — Telephone Encounter (Signed)
Left a message for patient to return call to schedule appointment to close gaps in care.  She has not been in this year and we need to document current medications, monitor hypertension, mammogram, colonoscopy and get a current bmi and follow up plan on her.

## 2015-03-18 ENCOUNTER — Other Ambulatory Visit: Payer: Self-pay | Admitting: Family Medicine

## 2015-03-25 ENCOUNTER — Telehealth: Payer: Self-pay

## 2015-03-25 NOTE — Telephone Encounter (Signed)
Pt is needing to know why she has been denied of her blood pressure medication   Best number [phone]

## 2015-03-25 NOTE — Telephone Encounter (Signed)
Patient will be coming in on 04/09/15 for an office visit .  Could you please send her in enough metoprolol to last until that day to [facility] [address] [employer]

## 2015-03-25 NOTE — Telephone Encounter (Signed)
Left a message for patient to return call.

## 2015-03-26 ENCOUNTER — Other Ambulatory Visit: Payer: Self-pay | Admitting: Family Medicine

## 2015-03-26 MED ORDER — METOPROLOL TARTRATE 50 MG PO TABS: ORAL_TABLET | ORAL | Status: DC

## 2015-04-09 ENCOUNTER — Ambulatory Visit (INDEPENDENT_AMBULATORY_CARE_PROVIDER_SITE_OTHER): Payer: Medicare Other | Admitting: Family Medicine

## 2015-04-09 ENCOUNTER — Encounter: Payer: Self-pay | Admitting: Family Medicine

## 2015-04-09 VITALS — BP 120/86 | HR 57 | Temp 98.0°F | Resp 16 | Ht 61.0 in | Wt 119.4 lb

## 2015-04-09 DIAGNOSIS — K219 Gastro-esophageal reflux disease without esophagitis: Secondary | ICD-10-CM

## 2015-04-09 DIAGNOSIS — R1314 Dysphagia, pharyngoesophageal phase: Secondary | ICD-10-CM | POA: Diagnosis not present

## 2015-04-09 DIAGNOSIS — I10 Essential (primary) hypertension: Secondary | ICD-10-CM

## 2015-04-09 DIAGNOSIS — J449 Chronic obstructive pulmonary disease, unspecified: Secondary | ICD-10-CM

## 2015-04-09 DIAGNOSIS — R1310 Dysphagia, unspecified: Secondary | ICD-10-CM

## 2015-04-09 DIAGNOSIS — R1319 Other dysphagia: Secondary | ICD-10-CM

## 2015-04-09 MED ORDER — OMEPRAZOLE 20 MG PO CPDR: 20.0000 mg | DELAYED_RELEASE_CAPSULE | Freq: Every day | ORAL | Status: DC

## 2015-04-09 MED ORDER — METOPROLOL TARTRATE 50 MG PO TABS: ORAL_TABLET | ORAL | Status: DC

## 2015-04-09 NOTE — Progress Notes (Signed)
Subjective:    Patient ID: Jasmin Munoz, female    DOB: 08/28/1950, 65 y.o.   MRN: [id number]  HPI This is a pleasant 65 yo female who presents today for follow up of HTN. She does not have regular care other than annual psychiatric care for refill of meds. She has had a rough 2 years- her nephew died and then her mother died. She is currently taking care of her husband who has alzheimers. She had him in a facility for several months but was unhappy with the care so she brought him home. She is looking to put him back in a facility as he is too much for her to handle. She admits to having neglected her health during the last 2 years. She is willing to have a more thorough visit in the future when her husband is settled. Her daughter lives close by and is very supportive.   She has been having increasing difficulty swallowing food. Has frequent heartburn. Was on omeprazole in the past. Has had decreased appetite due to stress.   Has had increasing urinary incontinence. Has to wear a pad. No burning. Little water intake. Worse with laugh, sneeze, for about 6 months. Thinks related to stress.   Past Medical History  Diagnosis Date  . Arthritis   . Depression   . Seizures (HCC)   . Cataract   . Anxiety   . COPD (chronic obstructive pulmonary disease) (HCC)   . Fibromyalgia   . Macular degeneration    Past Surgical History  Procedure Laterality Date  . Abdominal hysterectomy     Family History  Problem Relation Age of Onset  . Emphysema Mother   . Arthritis Father   . Arthritis Sister   . Joint hypermobility Brother    Social History  Substance Use Topics  . Smoking status: Former Smoker -- 1.00 packs/day for 35 years    Types: Cigarettes    Quit date: 01/27/2013  . Smokeless tobacco: None  . Alcohol Use: No      Review of Systems  Constitutional: Positive for appetite change (decreased due to stress) and unexpected weight change.  Respiratory: Positive for shortness of  breath (history of COPD, no worsening symptoms).   Cardiovascular: Positive for chest pain (Has fleeting pain about 2x/week, lasts few seconds).  Gastrointestinal: Positive for diarrhea and constipation. Negative for abdominal pain and blood in stool.       Difficulty swallowing food/liquids, get stuck.   Genitourinary: Negative for dysuria, frequency and difficulty urinating.       Urinary incontinence.   Psychiatric/Behavioral:       Has been on stable meds for years for depression.        Objective:   Physical Exam Physical Exam  Constitutional: Oriented to person, place, and time. She appears well-developed and well-nourished.  HENT:  Head: Normocephalic and atraumatic.  Eyes: Conjunctivae are normal.  Neck: Normal range of motion. Neck supple.  Cardiovascular: Normal rate, regular rhythm and normal heart sounds.   Pulmonary/Chest: Effort normal and breath sounds normal.  Musculoskeletal: Normal range of motion.  Neurological: Alert and oriented to person, place, and time.  Skin: Skin is warm and dry.  Psychiatric: Normal mood and affect. Behavior is normal. Judgment and thought content normal.  Vitals reviewed.     BP 120/86 mmHg  Pulse 57  Temp(Src) 98 F (36.7 C) (Oral)  Resp 16  Ht 5\' 1"  (1.549 m)  Wt 119 lb 6.4 oz (54.159 kg)  BMI 22.57 kg/m2  SpO2 98% Wt Readings from Last 3 Encounters:  04/09/15 119 lb 6.4 oz (54.159 kg)  09/24/13 138 lb 3.2 oz (62.687 kg)  05/23/13 136 lb 3.2 oz (61.78 kg)   Depression screen [facility] 2/9 04/09/2015 09/24/2013  Decreased Interest 0 3  Down, Depressed, Hopeless 0 3  PHQ - 2 Score 0 6  Altered sleeping - 0  Tired, decreased energy - 3  Change in appetite - 0  Feeling bad or failure about yourself  - 3  Trouble concentrating - 0  Moving slowly or fidgety/restless - 0  Suicidal thoughts - 0  PHQ-9 Score - 12         Assessment & Plan:  1. Essential hypertension - metoprolol (LOPRESSOR) 50 MG tablet; TAKE 1 TABLET (50 MG  TOTAL) BY MOUTH DAILY.  "OV NEEDED FOR REFILLS" 2ND  Dispense: 90 tablet; Refill: 3  2. Chronic obstructive pulmonary disease, unspecified COPD type (HCC)  3. Gastroesophageal reflux disease, esophagitis presence not specified - omeprazole (PRILOSEC) 20 MG capsule; Take 1 capsule (20 mg total) by mouth daily.  Dispense: 30 capsule; Refill: 3  4. Esophageal dysphagia - Ambulatory referral to Gastroenterology  - Had prolonged discussion with patient regarding recommendation to check lab work today including urine, CBC, CMET, TSH given her symptoms and weight loss. Patient declined any testing today, stating she will come in for CPE/labs once she has her husband "settled" in a care facility.  Encouraged her to return with any worsening symptoms.   Olean Ree, FNP-BC  [facility]  04/11/2015 8:40 PM

## 2016-02-17 ENCOUNTER — Telehealth: Payer: Self-pay

## 2016-02-17 NOTE — Telephone Encounter (Signed)
Pt is needing a letter about getting our of jury duty due to health issues and having to be at home to take care of husband that has early altiemers   Best number [phone]

## 2016-02-18 NOTE — Telephone Encounter (Signed)
Pt not seen since 04/2015 and by gessner, needs ov to establish with new pcp and get note

## 2016-02-23 NOTE — Telephone Encounter (Signed)
Pt advised and refused appt.

## 2016-05-03 ENCOUNTER — Other Ambulatory Visit: Payer: Self-pay | Admitting: Family Medicine

## 2016-05-03 DIAGNOSIS — I10 Essential (primary) hypertension: Secondary | ICD-10-CM

## 2016-06-01 ENCOUNTER — Other Ambulatory Visit: Payer: Self-pay | Admitting: Physician Assistant

## 2016-06-01 DIAGNOSIS — I10 Essential (primary) hypertension: Secondary | ICD-10-CM

## 2016-06-01 NOTE — Telephone Encounter (Signed)
Pt must have an appt before the #10 pills I gave her run out - she needs to determine a new PCP

## 2016-06-06 ENCOUNTER — Other Ambulatory Visit: Payer: Self-pay | Admitting: Physician Assistant

## 2016-06-06 DIAGNOSIS — I10 Essential (primary) hypertension: Secondary | ICD-10-CM

## 2016-06-07 NOTE — Telephone Encounter (Signed)
04/09/15 last ov

## 2016-07-05 ENCOUNTER — Ambulatory Visit (INDEPENDENT_AMBULATORY_CARE_PROVIDER_SITE_OTHER): Payer: Medicare Other | Admitting: Physician Assistant

## 2016-07-05 ENCOUNTER — Encounter: Payer: Self-pay | Admitting: Physician Assistant

## 2016-07-05 VITALS — BP 117/76 | HR 62 | Temp 98.0°F | Resp 18 | Ht 61.0 in | Wt 103.8 lb

## 2016-07-05 DIAGNOSIS — J449 Chronic obstructive pulmonary disease, unspecified: Secondary | ICD-10-CM

## 2016-07-05 DIAGNOSIS — I10 Essential (primary) hypertension: Secondary | ICD-10-CM

## 2016-07-05 MED ORDER — METOPROLOL TARTRATE 50 MG PO TABS: 50.0000 mg | ORAL_TABLET | Freq: Every day | ORAL | 3 refills | Status: DC

## 2016-07-05 NOTE — Patient Instructions (Addendum)
Good luck with your husband.  If you need something please call.     IF you received an x-ray today, you will receive an invoice from [facility]. Please contact [facility] at [phone] with questions or concerns regarding your invoice.   IF you received labwork today, you will receive an invoice from [facility]. Please contact [facility] at [phone] with questions or concerns regarding your invoice.   Our billing staff will not be able to assist you with questions regarding bills from these companies.  You will be contacted with the lab results as soon as they are available. The fastest way to get your results is to activate your My Chart account. Instructions are located on the last page of this paperwork. If you have not heard from us regarding the results in 2 weeks, please contact this office.
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
## 2016-07-06 LAB — CMP14+EGFR
A/G RATIO: 1.7 (ref 1.2–2.2)
ALK PHOS: 101 IU/L (ref 39–117)
ALT: 13 IU/L (ref 0–32)
AST: 17 IU/L (ref 0–40)
Albumin: 4.3 g/dL (ref 3.6–4.8)
BILIRUBIN TOTAL: 0.4 mg/dL (ref 0.0–1.2)
BUN / CREAT RATIO: 11 — AB (ref 12–28)
BUN: 8 mg/dL (ref 8–27)
CO2: 27 mmol/L (ref 18–29)
Calcium: 9.2 mg/dL (ref 8.7–10.3)
Chloride: 102 mmol/L (ref 96–106)
Creatinine, Ser: 0.71 mg/dL (ref 0.57–1.00)
GFR calc Af Amer: 103 mL/min/{1.73_m2} (ref >59)
GFR, EST NON AFRICAN AMERICAN: 90 mL/min/{1.73_m2} (ref >59)
GLOBULIN, TOTAL: 2.6 g/dL (ref 1.5–4.5)
Glucose: 83 mg/dL (ref 65–99)
Potassium: 4.7 mmol/L (ref 3.5–5.2)
Sodium: 141 mmol/L (ref 134–144)
Total Protein: 6.9 g/dL (ref 6.0–8.5)

## 2016-07-06 LAB — CBC
HEMATOCRIT: 39.8 % (ref 34.0–46.6)
Hemoglobin: 13.1 g/dL (ref 11.1–15.9)
MCH: 29.5 pg (ref 26.6–33.0)
MCHC: 32.9 g/dL (ref 31.5–35.7)
MCV: 90 fL (ref 79–97)
Platelets: 228 10*3/uL (ref 150–379)
RBC: 4.44 x10E6/uL (ref 3.77–5.28)
RDW: 13.4 % (ref 12.3–15.4)
WBC: 8.2 10*3/uL (ref 3.4–10.8)

## 2016-07-06 LAB — LIPID PANEL
CHOL/HDL RATIO: 3.4 ratio (ref 0.0–4.4)
Cholesterol, Total: 193 mg/dL (ref 100–199)
HDL: 57 mg/dL (ref >39)
LDL CALC: 102 mg/dL — AB (ref 0–99)
TRIGLYCERIDES: 172 mg/dL — AB (ref 0–149)
VLDL CHOLESTEROL CAL: 34 mg/dL (ref 5–40)

## 2016-07-06 LAB — TSH: TSH: 4.08 u[IU]/mL (ref 0.450–4.500)

## 2016-07-26 ENCOUNTER — Ambulatory Visit (INDEPENDENT_AMBULATORY_CARE_PROVIDER_SITE_OTHER): Payer: Medicare Other | Admitting: Physician Assistant

## 2016-07-26 ENCOUNTER — Encounter: Payer: Self-pay | Admitting: Physician Assistant

## 2016-07-26 VITALS — BP 111/75 | HR 56 | Temp 97.8°F | Resp 18 | Ht 60.24 in | Wt 105.2 lb

## 2016-07-26 DIAGNOSIS — R339 Retention of urine, unspecified: Secondary | ICD-10-CM | POA: Diagnosis not present

## 2016-07-26 LAB — POCT URINALYSIS DIP (MANUAL ENTRY)
Bilirubin, UA: NEGATIVE
GLUCOSE UA: NEGATIVE mg/dL
Ketones, POC UA: NEGATIVE mg/dL
NITRITE UA: NEGATIVE
PH UA: 5.5 (ref 5.0–8.0)
Protein Ur, POC: 100 mg/dL — AB
Spec Grav, UA: 1.01 (ref 1.010–1.025)
UROBILINOGEN UA: 0.2 U/dL

## 2016-07-26 MED ORDER — CEPHALEXIN 500 MG PO CAPS: 500.0000 mg | ORAL_CAPSULE | Freq: Three times a day (TID) | ORAL | 0 refills | Status: DC

## 2016-07-26 NOTE — Patient Instructions (Signed)
     IF you received an x-ray today, you will receive an invoice from [facility]. Please contact [facility] at [phone] with questions or concerns regarding your invoice.   IF you received labwork today, you will receive an invoice from [facility]. Please contact [facility] at [phone] with questions or concerns regarding your invoice.   Our billing staff will not be able to assist you with questions regarding bills from these companies.  You will be contacted with the lab results as soon as they are available. The fastest way to get your results is to activate your My Chart account. Instructions are located on the last page of this paperwork. If you have not heard from us regarding the results in 2 weeks, please contact this office.     

## 2016-07-26 NOTE — Progress Notes (Signed)
07/29/2016 9:22 AM   DOB: 1950-03-30 / MRN: [id number]  SUBJECTIVE:  Jasmin Munoz is a 66 y.o. female presenting for urinary urgency and hematuria that started 2 days ago.  Associates dysuria.  Denies abdominal pain.  No history of kidney stones. Long history of smoking and if she does not improve I will get her into urology as quickly as possible.   She is allergic to sulfa antibiotics.   She  has a past medical history of Anxiety; Arthritis; Cataract; COPD (chronic obstructive pulmonary disease) (HCC); Depression; Fibromyalgia; Macular degeneration; and Seizures (HCC).    She  reports that she quit smoking about 3 years ago. Her smoking use included Cigarettes. She has a 35.00 pack-year smoking history. She uses smokeless tobacco. She reports that she does not drink alcohol or use drugs. She  has no sexual activity history on file. The patient  has a past surgical history that includes Abdominal hysterectomy.  Her family history includes Arthritis in her father and sister; Emphysema in her mother; Joint hypermobility in her brother.  Review of Systems  Constitutional: Negative for fever.  Genitourinary: Positive for dysuria, frequency, hematuria and urgency. Negative for flank pain.  Neurological: Negative for dizziness.    The problem list and medications were reviewed and updated by myself where necessary and exist elsewhere in the encounter.   OBJECTIVE:  BP 111/75 (BP Location: Right Arm, Patient Position: Sitting, Cuff Size: Normal)   Pulse (!) 56   Temp 97.8 F (36.6 C) (Oral)   Resp 18   Ht 5' 0.24" (1.53 m)   Wt 105 lb 3.2 oz (47.7 kg)   SpO2 98%   BMI 20.38 kg/m   Physical Exam  Constitutional: She is oriented to person, place, and time.  Cardiovascular: Normal rate and regular rhythm.   Pulmonary/Chest: Effort normal and breath sounds normal.  Musculoskeletal: Normal range of motion.  Neurological: She is alert and oriented to person, place, and time. She  displays normal reflexes. No cranial nerve deficit. She exhibits normal muscle tone. Coordination normal.  Skin: Skin is warm and dry.    Results for orders placed or performed in visit on 07/26/16 (from the past 72 hour(s))  POCT urinalysis dipstick     Status: Abnormal   Collection Time: 07/26/16  3:54 PM  Result Value Ref Range   Color, UA straw (A) yellow   Clarity, UA clear clear   Glucose, UA negative negative mg/dL   Bilirubin, UA negative negative   Ketones, POC UA negative negative mg/dL   Spec Grav, UA 7.829 5.621 - 1.025   Blood, UA large (A) negative   pH, UA 5.5 5.0 - 8.0   Protein Ur, POC =100 (A) negative mg/dL   Urobilinogen, UA 0.2 0.2 or 1.0 E.U./dL   Nitrite, UA Negative Negative   Leukocytes, UA Trace (A) Negative  Urine Microscopic     Status: Abnormal   Collection Time: 07/26/16  4:38 PM  Result Value Ref Range   WBC, UA 11-30 (A) 0 - 5 /hpf   RBC, UA 0-2 0 - 2 /hpf   Epithelial Cells (non renal) 0-10 0 - 10 /hpf   Casts None seen None seen /lpf   Mucus, UA Present Not Estab.   Bacteria, UA Few None seen/Few  Urine Culture     Status: None   Collection Time: 07/26/16  5:28 PM  Result Value Ref Range   Urine Culture, Routine Final report    Organism ID, Bacteria  No growth     No results found.  ASSESSMENT AND PLAN:  Jasmin Munoz was seen today for urinary retention and hematuria.  Diagnoses and all orders for this visit:  Urinary retention: Patient with what appears to be a UTI.  She has failed keflex symptomatically.  Will try her on Cipro.  If this fails I will bring her back in for a pelvic exam and will consider CT urogram along with referral to urology.  -     POCT urinalysis dipstick -     Cancel: POCT Microscopic Urinalysis ([facility]) -     Urine Microscopic  -     Urine Culture  Other orders -     Discontinue: cephALEXin (KEFLEX) 500 MG capsule; Take 1 capsule (500 mg total) by mouth 3 (three) times daily.    The patient is advised to call or  return to clinic if she does not see an improvement in symptoms, or to seek the care of the closest emergency department if she worsens with the above plan.   Jasmin Munoz, MHS, PA-C [facility] 07/29/2016 9:22 AM

## 2016-07-27 LAB — URINALYSIS, MICROSCOPIC ONLY: CASTS: NONE SEEN /LPF

## 2016-07-27 LAB — URINE CULTURE: ORGANISM ID, BACTERIA: NO GROWTH

## 2016-07-28 ENCOUNTER — Telehealth: Payer: Self-pay | Admitting: Physician Assistant

## 2016-07-28 ENCOUNTER — Telehealth: Payer: Self-pay

## 2016-07-28 ENCOUNTER — Other Ambulatory Visit: Payer: Self-pay

## 2016-07-28 NOTE — Telephone Encounter (Signed)
[facility] Cipro 500 mg bid x [facility] Call placed to patient to make her aware. Patient verbalized understanding./ S.Boston Catarino,CMA

## 2016-07-28 NOTE — Telephone Encounter (Signed)
Casimiro NeedleMichael said 7 days so 14 tablets.

## 2016-07-28 NOTE — Telephone Encounter (Signed)
Call in cipro 500 bid to her pharmacy and have her stop the keflex.  Lets give her two more days and if she is not better I want to see her back so he can think about an abdominal image, pelvic exam and possibly a urology referral.

## 2016-07-28 NOTE — Telephone Encounter (Signed)
Would you like her to come back for an OV ?

## 2016-07-28 NOTE — Telephone Encounter (Signed)
Pt is still having symptoms of UTI and would like to know what to do next [phone]

## 2016-10-01 ENCOUNTER — Telehealth: Payer: Self-pay

## 2016-10-01 NOTE — Telephone Encounter (Signed)
Called pt to see if she could do AWV on Tuesday or Wednesday due to overbooking. -nr   Sherle PoeNicole Konni Kesinger, Conrad BurlingtonB.A.  Care Guide [phone]

## 2016-10-05 ENCOUNTER — Ambulatory Visit: Payer: Medicare Other

## 2016-10-28 ENCOUNTER — Ambulatory Visit (INDEPENDENT_AMBULATORY_CARE_PROVIDER_SITE_OTHER): Payer: Medicare Other

## 2016-10-28 VITALS — BP 120/82 | HR 59 | Temp 98.1°F | Ht 60.0 in | Wt 104.4 lb

## 2016-10-28 DIAGNOSIS — Z0000 Encounter for general adult medical examination without abnormal findings: Secondary | ICD-10-CM | POA: Diagnosis not present

## 2016-10-28 NOTE — Progress Notes (Signed)
Subjective:   Jasmin Munoz is a 66 y.o. female who presents for an Initial Medicare Annual Wellness Visit.  Review of Systems    N/A  Cardiac Risk Factors include: advanced age (>53men, >74 women);hypertension;smoking/ tobacco exposure     Objective:    Today's Vitals   10/28/16 1034  BP: 120/82  Pulse: (!) 59  Temp: 98.1 F (36.7 C)  TempSrc: Oral  SpO2: 92%  Weight: 104 lb 6 oz (47.3 kg)  Height: 5' (1.524 m)   Body mass index is 20.38 kg/m.   Current Medications (verified) Outpatient Encounter Prescriptions as of 10/28/2016  Medication Sig  . albuterol (PROVENTIL HFA;VENTOLIN HFA) 108 (90 BASE) MCG/ACT inhaler Inhale 1 puff into the lungs every 6 (six) hours as needed for wheezing or shortness of breath.  . diazepam (VALIUM) 10 MG tablet Take 1 tablet (10 mg total) by mouth every 6 (six) hours as needed for anxiety.  . metoprolol tartrate (LOPRESSOR) 50 MG tablet Take 1 tablet (50 mg total) by mouth daily.  . [DISCONTINUED] PARoxetine (PAXIL) 40 MG tablet Take 40 mg by mouth every morning.   No facility-administered encounter medications on file as of 10/28/2016.     Allergies (verified) Sulfa antibiotics   History: Past Medical History:  Diagnosis Date  . Anxiety   . Arthritis   . Cataract   . COPD (chronic obstructive pulmonary disease) (HCC)   . Depression   . Fibromyalgia   . Macular degeneration   . Seizures (HCC)    Past Surgical History:  Procedure Laterality Date  . ABDOMINAL HYSTERECTOMY     Family History  Problem Relation Age of Onset  . Emphysema Mother   . Arthritis Father   . Arthritis Sister   . Joint hypermobility Brother    Social History   Occupational History  . Not on file.   Social History Main Topics  . Smoking status: Current Every Day Smoker    Packs/day: 1.00    Years: 35.00    Types: Cigarettes    Last attempt to quit: 01/27/2013  . Smokeless tobacco: Current User  . Alcohol use No  . Drug use: No  . Sexual  activity: Not on file    Tobacco Counseling Ready to quit: No Counseling given: Not Answered   Activities of Daily Living In your present state of health, do you have any difficulty performing the following activities: 10/28/2016 07/26/2016  Hearing? N N  Vision? N Y  Difficulty concentrating or making decisions? N N  Walking or climbing stairs? N Y  Dressing or bathing? N N  Doing errands, shopping? N N  Preparing Food and eating ? N -  Using the Toilet? N -  In the past six months, have you accidently leaked urine? Y -  Comment Patient has urine leakage all the time -  Do you have problems with loss of bowel control? N -  Managing your Medications? N -  Managing your Finances? N -  Housekeeping or managing your Housekeeping? N -  Some recent data might be hidden    Immunizations and Health Maintenance  There is no immunization history on file for this patient. Health Maintenance Due  Topic Date Due  . PAP SMEAR  01/02/1972    Patient Care Team: Silvestre Mesi as PCP - General (Urgent Care)  Indicate any recent Medical Services you may have received from other than [facility] providers in the past year (date may be approximate).  Assessment:   This is a routine wellness examination for Jasmin Munoz.   Hearing/Vision screen Vision Screening Comments: Patient doesn't see an eye doctor on a regular basis  Dietary issues and exercise activities discussed: Current Exercise Habits: The patient does not participate in regular exercise at present, Exercise limited by: None identified  Goals    . declined          Patient declined healthcare goal      Depression Screen PHQ 2/9 Scores 10/28/2016 07/26/2016 07/05/2016 04/09/2015 09/24/2013  PHQ - 2 Score 0 6  PHQ- 9 Score - 12    Fall Risk Fall Risk  10/28/2016 07/26/2016 07/05/2016 04/09/2015 09/24/2013  Falls in the past year? No No No No No    Cognitive Function:     6CIT Screen 10/28/2016  What Year? 0  points  What month? 3 points  What time? 0 points  Count back from 20 0 points  Months in reverse 0 points  Repeat phrase 2 points  Total Score 5    Screening Tests Health Maintenance  Topic Date Due  . PAP SMEAR  01/02/1972  . INFLUENZA VACCINE  10/29/2026 (Originally 09/01/2016)  . MAMMOGRAM  10/29/2026 (Originally 01/01/2001)  . DEXA SCAN  10/29/2026 (Originally 01/02/2016)  . COLONOSCOPY  10/29/2026 (Originally 04/03/2014)  . TETANUS/TDAP  10/29/2026 (Originally 01/01/1970)  . Hepatitis C Screening  10/29/2026 (Originally 12/14/1950)  . HIV Screening  10/29/2026 (Originally 01/01/1966)  . PNA vac Low Risk Adult (1 of 2 - PCV13) 10/29/2026 (Originally 01/02/2016)      Plan:   I have personally reviewed and noted the following in the patient's chart:   . Medical and social history . Use of alcohol, tobacco or illicit drugs  . Current medications and supplements . Functional ability and status . Nutritional status . Physical activity . Advanced directives . List of other physicians . Hospitalizations, surgeries, and ER visits in previous 12 months . Vitals . Screenings to include cognitive, depression, and falls . Referrals and appointments  In addition, I have reviewed and discussed with patient certain preventive protocols, quality metrics, and best practice recommendations. A written personalized care plan for preventive services as well as general preventive health recommendations were provided to patient.  Patient declined all vaccines, all screening tests and blood work.  Patient failed PHQ-9, patient is suicidal, Dr. Katrinka Blazing was notified and we both talked to the patient to try to get her to see a provider and she refused and left against medical advice.  Janalyn Shy, LPN   1/61/0960

## 2016-10-28 NOTE — Patient Instructions (Addendum)
Jasmin Munoz , Thank you for taking time to come for your Medicare Wellness Visit. I appreciate your ongoing commitment to your health goals. Please review the following plan we discussed and let me know if I can assist you in the future.   Screening recommendations/referrals: Colonoscopy: declined Mammogram: declined Bone Density: declined Recommended yearly ophthalmology/optometry visit for glaucoma screening and checkup Recommended yearly dental visit for hygiene and checkup  Vaccinations: Influenza vaccine: declined  Pneumococcal vaccine: declined Tdap vaccine: declined Shingles vaccine: declined  Advanced directives:Please bring a copy of your POA (Power of Attorney) and/or Living Will to your next appointment.    Conditions/risks identified: you declined healthcare goal  Next appointment: schedule follow up with PCP, 1 year for AWV   Preventive Care 65 Years and Older, Female Preventive care refers to lifestyle choices and visits with your health care provider that can promote health and wellness. What does preventive care include?  A yearly physical exam. This is also called an annual well check.  Dental exams once or twice a year.  Routine eye exams. Ask your health care provider how often you should have your eyes checked.  Personal lifestyle choices, including:  Daily care of your teeth and gums.  Regular physical activity.  Eating a healthy diet.  Avoiding tobacco and drug use.  Limiting alcohol use.  Practicing safe sex.  Taking low-dose aspirin every day.  Taking vitamin and mineral supplements as recommended by your health care provider. What happens during an annual well check? The services and screenings done by your health care provider during your annual well check will depend on your age, overall health, lifestyle risk factors, and family history of disease. Counseling  Your health care provider may ask you questions about your:  Alcohol  use.  Tobacco use.  Drug use.  Emotional well-being.  Home and relationship well-being.  Sexual activity.  Eating habits.  History of falls.  Memory and ability to understand (cognition).  Work and work Astronomer.  Reproductive health. Screening  You may have the following tests or measurements:  Height, weight, and BMI.  Blood pressure.  Lipid and cholesterol levels. These may be checked every 5 years, or more frequently if you are over 84 years old.  Skin check.  Lung cancer screening. You may have this screening every year starting at age 31 if you have a 30-pack-year history of smoking and currently smoke or have quit within the past 15 years.  Fecal occult blood test (FOBT) of the stool. You may have this test every year starting at age 39.  Flexible sigmoidoscopy or colonoscopy. You may have a sigmoidoscopy every 5 years or a colonoscopy every 10 years starting at age 21.  Hepatitis C blood test.  Hepatitis B blood test.  Sexually transmitted disease (STD) testing.  Diabetes screening. This is done by checking your blood sugar (glucose) after you have not eaten for a while (fasting). You may have this done every 1-3 years.  Bone density scan. This is done to screen for osteoporosis. You may have this done starting at age 41.  Mammogram. This may be done every 1-2 years. Talk to your health care provider about how often you should have regular mammograms. Talk with your health care provider about your test results, treatment options, and if necessary, the need for more tests. Vaccines  Your health care provider may recommend certain vaccines, such as:  Influenza vaccine. This is recommended every year.  Tetanus, diphtheria, and acellular pertussis (Tdap, Td)  vaccine. You may need a Td booster every 10 years.  Zoster vaccine. You may need this after age 15.  Pneumococcal 13-valent conjugate (PCV13) vaccine. One dose is recommended after age  65.  Pneumococcal polysaccharide (PPSV23) vaccine. One dose is recommended after age 48. Talk to your health care provider about which screenings and vaccines you need and how often you need them. This information is not intended to replace advice given to you by your health care provider. Make sure you discuss any questions you have with your health care provider. Document Released: 02/14/2015 Document Revised: 10/08/2015 Document Reviewed: 11/19/2014 Elsevier Interactive Patient Education  2017 [place] Prevention in the Home Falls can cause injuries. They can happen to people of all ages. There are many things you can do to make your home safe and to help prevent falls. What can I do on the outside of my home?  Regularly fix the edges of walkways and driveways and fix any cracks.  Remove anything that might make you trip as you walk through a door, such as a raised step or threshold.  Trim any bushes or trees on the path to your home.  Use bright outdoor lighting.  Clear any walking paths of anything that might make someone trip, such as rocks or tools.  Regularly check to see if handrails are loose or broken. Make sure that both sides of any steps have handrails.  Any raised decks and porches should have guardrails on the edges.  Have any leaves, snow, or ice cleared regularly.  Use sand or salt on walking paths during winter.  Clean up any spills in your garage right away. This includes oil or grease spills. What can I do in the bathroom?  Use night lights.  Install grab bars by the toilet and in the tub and shower. Do not use towel bars as grab bars.  Use non-skid mats or decals in the tub or shower.  If you need to sit down in the shower, use a plastic, non-slip stool.  Keep the floor dry. Clean up any water that spills on the floor as soon as it happens.  Remove soap buildup in the tub or shower regularly.  Attach bath mats securely with double-sided  non-slip rug tape.  Do not have throw rugs and other things on the floor that can make you trip. What can I do in the bedroom?  Use night lights.  Make sure that you have a light by your bed that is easy to reach.  Do not use any sheets or blankets that are too big for your bed. They should not hang down onto the floor.  Have a firm chair that has side arms. You can use this for support while you get dressed.  Do not have throw rugs and other things on the floor that can make you trip. What can I do in the kitchen?  Clean up any spills right away.  Avoid walking on wet floors.  Keep items that you use a lot in easy-to-reach places.  If you need to reach something above you, use a strong step stool that has a grab bar.  Keep electrical cords out of the way.  Do not use floor polish or wax that makes floors slippery. If you must use wax, use non-skid floor wax.  Do not have throw rugs and other things on the floor that can make you trip. What can I do with my stairs?  Do not leave  any items on the stairs.  Make sure that there are handrails on both sides of the stairs and use them. Fix handrails that are broken or loose. Make sure that handrails are as long as the stairways.  Check any carpeting to make sure that it is firmly attached to the stairs. Fix any carpet that is loose or worn.  Avoid having throw rugs at the top or bottom of the stairs. If you do have throw rugs, attach them to the floor with carpet tape.  Make sure that you have a light switch at the top of the stairs and the bottom of the stairs. If you do not have them, ask someone to add them for you. What else can I do to help prevent falls?  Wear shoes that:  Do not have high heels.  Have rubber bottoms.  Are comfortable and fit you well.  Are closed at the toe. Do not wear sandals.  If you use a stepladder:  Make sure that it is fully opened. Do not climb a closed stepladder.  Make sure that both  sides of the stepladder are locked into place.  Ask someone to hold it for you, if possible.  Clearly mark and make sure that you can see:  Any grab bars or handrails.  First and last steps.  Where the edge of each step is.  Use tools that help you move around (mobility aids) if they are needed. These include:  Canes.  Walkers.  Scooters.  Crutches.  Turn on the lights when you go into a dark area. Replace any light bulbs as soon as they burn out.  Set up your furniture so you have a clear path. Avoid moving your furniture around.  If any of your floors are uneven, fix them.  If there are any pets around you, be aware of where they are.  Review your medicines with your doctor. Some medicines can make you feel dizzy. This can increase your chance of falling. Ask your doctor what other things that you can do to help prevent falls. This information is not intended to replace advice given to you by your health care provider. Make sure you discuss any questions you have with your health care provider. Document Released: 11/14/2008 Document Revised: 06/26/2015 Document Reviewed: 02/22/2014 Elsevier Interactive Patient Education  2017 [employer].

## 2017-03-16 ENCOUNTER — Other Ambulatory Visit: Payer: Self-pay | Admitting: Physician Assistant

## 2017-03-16 DIAGNOSIS — I10 Essential (primary) hypertension: Secondary | ICD-10-CM

## 2017-04-11 ENCOUNTER — Ambulatory Visit: Payer: Medicare Other | Admitting: Physician Assistant

## 2017-06-04 ENCOUNTER — Other Ambulatory Visit: Payer: Self-pay | Admitting: Physician Assistant

## 2017-06-04 DIAGNOSIS — I10 Essential (primary) hypertension: Secondary | ICD-10-CM

## 2017-08-13 ENCOUNTER — Emergency Department (HOSPITAL_COMMUNITY)
Admission: EM | Admit: 2017-08-13 | Discharge: 2017-08-14 | Disposition: A | Discharge status: Home / Self Care | Payer: Medicare Other | Attending: Emergency Medicine | Admitting: Emergency Medicine

## 2017-08-13 ENCOUNTER — Encounter (HOSPITAL_COMMUNITY): Payer: Self-pay | Admitting: Emergency Medicine

## 2017-08-13 ENCOUNTER — Emergency Department (HOSPITAL_COMMUNITY): Payer: Medicare Other

## 2017-08-13 DIAGNOSIS — F17210 Nicotine dependence, cigarettes, uncomplicated: Secondary | ICD-10-CM | POA: Insufficient documentation

## 2017-08-13 DIAGNOSIS — R0602 Shortness of breath: Secondary | ICD-10-CM | POA: Diagnosis not present

## 2017-08-13 DIAGNOSIS — R32 Unspecified urinary incontinence: Secondary | ICD-10-CM | POA: Diagnosis not present

## 2017-08-13 DIAGNOSIS — Z79899 Other long term (current) drug therapy: Secondary | ICD-10-CM | POA: Diagnosis not present

## 2017-08-13 DIAGNOSIS — J441 Chronic obstructive pulmonary disease with (acute) exacerbation: Secondary | ICD-10-CM | POA: Diagnosis not present

## 2017-08-13 DIAGNOSIS — R001 Bradycardia, unspecified: Secondary | ICD-10-CM | POA: Diagnosis not present

## 2017-08-13 DIAGNOSIS — I10 Essential (primary) hypertension: Secondary | ICD-10-CM | POA: Insufficient documentation

## 2017-08-13 DIAGNOSIS — R0902 Hypoxemia: Secondary | ICD-10-CM | POA: Diagnosis not present

## 2017-08-13 DIAGNOSIS — R062 Wheezing: Secondary | ICD-10-CM | POA: Diagnosis not present

## 2017-08-13 LAB — URINALYSIS, ROUTINE W REFLEX MICROSCOPIC
BILIRUBIN URINE: NEGATIVE
Glucose, UA: NEGATIVE mg/dL
HGB URINE DIPSTICK: NEGATIVE
Ketones, ur: NEGATIVE mg/dL
Leukocytes, UA: NEGATIVE
NITRITE: NEGATIVE
PROTEIN: NEGATIVE mg/dL
Specific Gravity, Urine: 1.013 (ref 1.005–1.030)
pH: 5 (ref 5.0–8.0)

## 2017-08-13 LAB — CBC
HCT: 39.3 % (ref 36.0–46.0)
Hemoglobin: 12.5 g/dL (ref 12.0–15.0)
MCH: 29.3 pg (ref 26.0–34.0)
MCHC: 31.8 g/dL (ref 30.0–36.0)
MCV: 92.3 fL (ref 78.0–100.0)
PLATELETS: 220 10*3/uL (ref 150–400)
RBC: 4.26 MIL/uL (ref 3.87–5.11)
RDW: 12.5 % (ref 11.5–15.5)
WBC: 8.2 10*3/uL (ref 4.0–10.5)

## 2017-08-13 LAB — BASIC METABOLIC PANEL
Anion gap: 8 (ref 5–15)
BUN: 13 mg/dL (ref 8–23)
CALCIUM: 9 mg/dL (ref 8.9–10.3)
CHLORIDE: 109 mmol/L (ref 98–111)
CO2: 28 mmol/L (ref 22–32)
CREATININE: 0.68 mg/dL (ref 0.44–1.00)
GFR calc Af Amer: >60 mL/min (ref >60)
GFR calc non Af Amer: >60 mL/min (ref >60)
Glucose, Bld: 120 mg/dL — ABNORMAL HIGH (ref 70–99)
Potassium: 3.4 mmol/L — ABNORMAL LOW (ref 3.5–5.1)
SODIUM: 145 mmol/L (ref 135–145)

## 2017-08-13 MED ORDER — ALBUTEROL SULFATE HFA 108 (90 BASE) MCG/ACT IN AERS
1.0000 | INHALATION_SPRAY | Freq: Once | RESPIRATORY_TRACT | Status: AC
  Administered 2017-08-14: 1 via RESPIRATORY_TRACT
  Filled 2017-08-13: qty 6.7

## 2017-08-13 MED ORDER — ALBUTEROL SULFATE (2.5 MG/3ML) 0.083% IN NEBU: 5.0000 mg | INHALATION_SOLUTION | Freq: Once | RESPIRATORY_TRACT | Status: DC

## 2017-08-13 NOTE — ED Notes (Signed)
Bed: [id number] Expected date:  Expected time:  Means of arrival:  Comments: [age] yo F/COPD Exacerbation

## 2017-08-13 NOTE — ED Triage Notes (Signed)
Pt comes to ed via ems, copd. Started 3 days and got worst today. Ems arrived 8:40pm, pt was wheezing.denies chest pain. Verbalizes chest thigh ness however. Alert x4, v/s on arrival 142/90 hr 58, rr22. Pt tried using her inhaler 2 times w/o relief and then called ems. Ems 18 in right AC, 125mg  solumedrol, 10mg  albuterol and 1 mg Atrovent ned given. spo2 99 on 2 liters.  Wheezing still present, but verbalizes ems helped her a lot.

## 2017-08-13 NOTE — ED Provider Notes (Signed)
[facility]-EMERGENCY DEPT Provider Note   CSN: [id number] Arrival date & time: 08/13/17  2119     History   Chief Complaint Chief Complaint  Patient presents with  . COPD    HPI Jasmin Munoz is a 67 y.o. female presenting for evaluation of shortness of breath.  Patient states she has a history of COPD, she has been out of her medications for several months.  She reports worsening shortness of breath for the past several days.  She was given multiple breathing treatments and Solu-Medrol with EMS, reports significant improvement of her symptoms.  She denies fevers, chills, cough, chest pain, nausea, vomiting, or abnormal bowel movements.  Additionally, patient states for the past year she has been having urinary incontinence.  Both stress and urge.  She is concerned that she needs a bladder suspension.  She denies dysuria or hematuria.  She states that occasionally she feels like she needs to pee, but has difficulty releasing the urine, while other times she is leaking urine.  She has not seen anybody about this.  Her primary care doctor is [name], she states she can follow-up with him next week.  Patient smokes approximately 10 cigarettes a day.  Denies alcohol or drug use.  Per EMS, patient initially had lots of wheezing upon arrival to the house.  HPI  Past Medical History:  Diagnosis Date  . Anxiety   . Arthritis   . Cataract   . COPD (chronic obstructive pulmonary disease) (HCC)   . Depression   . Fibromyalgia   . Macular degeneration   . Seizures [facility])     Patient Active Problem List   Diagnosis Date Noted  . COPD (chronic obstructive pulmonary disease) (HCC) 02/03/2013  . COPD exacerbation (HCC) 02/03/2013  . Hypoxemia 02/03/2013  . Generalized anxiety disorder 02/03/2013  . HTN (hypertension) 02/03/2013  . Nonspecific abnormal electrocardiogram (ECG) (EKG) 02/03/2013  . DEPRESSION 05/24/2006  . TENDINITIS 05/24/2006    Past Surgical  History:  Procedure Laterality Date  . ABDOMINAL HYSTERECTOMY       OB History   None      Home Medications    Prior to Admission medications   Medication Sig Start Date End Date Taking? Authorizing Provider  albuterol (PROVENTIL HFA;VENTOLIN HFA) 108 (90 BASE) MCG/ACT inhaler Inhale 1 puff into the lungs every 6 (six) hours as needed for wheezing or shortness of breath. 05/23/13   Elvina Sidle, MD  diazepam (VALIUM) 10 MG tablet Take 1 tablet (10 mg total) by mouth every 6 (six) hours as needed for anxiety. 05/23/13   Elvina Sidle, MD  metoprolol tartrate (LOPRESSOR) 50 MG tablet Take 1 tablet (50 mg total) by mouth daily. Office visit needed for additional refills 06/06/17   Ofilia Neas, PA-C    Family History Family History  Problem Relation Age of Onset  . Emphysema Mother   . Arthritis Father   . Arthritis Sister   . Joint hypermobility Brother     Social History Social History   Tobacco Use  . Smoking status: Current Every Day Smoker    Packs/day: 1.00    Years: 35.00    Pack years: 35.00    Types: Cigarettes    Last attempt to quit: 01/27/2013    Years since quitting: 4.5  . Smokeless tobacco: Current User  Substance Use Topics  . Alcohol use: No  . Drug use: No     Allergies   Sulfa antibiotics   Review of  Systems Review of Systems  Respiratory: Positive for shortness of breath.   Genitourinary: Positive for difficulty urinating and urgency.  All other systems reviewed and are negative.    Physical Exam Updated Vital Signs BP 119/83   Pulse 66   Temp 97.8 F (36.6 C) (Oral)   Resp 15   Ht 5\' 1"  (1.549 m)   Wt 45.4 kg (100 lb)   SpO2 96%   BMI 18.89 kg/m   Physical Exam  Constitutional: She is oriented to person, place, and time. She appears well-developed and well-nourished. No distress.  Elderly, cachectic appearing female in no acute distress  HENT:  Head: Normocephalic and atraumatic.  Eyes: Pupils are equal, round, and  reactive to light. Conjunctivae and EOM are normal.  Neck: Normal range of motion. Neck supple.  Cardiovascular: Normal rate, regular rhythm and intact distal pulses.  Pulmonary/Chest: Effort normal. No respiratory distress. She has decreased breath sounds. She has no wheezes. She has no rhonchi. She has no rales.  Speaking in full sentences.  Clear lung sounds in all fields.  No signs of respiratory distress.  No accessory muscle use.  Soft breath sounds in all fields.  No wheezing noted.  Abdominal: Soft. She exhibits no distension and no mass. There is no tenderness. There is no guarding.  Musculoskeletal: Normal range of motion.  Neurological: She is alert and oriented to person, place, and time.  Skin: Skin is warm and dry.  Psychiatric: She has a normal mood and affect.  Nursing note and vitals reviewed.    ED Treatments / Results  Labs (all labs ordered are listed, but only abnormal results are displayed) Labs Reviewed  BASIC METABOLIC PANEL - Abnormal; Notable for the following components:      Result Value   Potassium 3.4 (*)    Glucose, Bld 120 (*)    All other components within normal limits  URINE CULTURE  CBC  URINALYSIS, ROUTINE W REFLEX MICROSCOPIC    EKG EKG Interpretation  Date/Time:  Saturday August 13 2017 21:30:16 EDT Ventricular Rate:  65 PR Interval:    QRS Duration: 81 QT Interval:  400 QTC Calculation: 413 R Axis:   43 Text Interpretation:  Sinus rhythm Probable left atrial enlargement Anteroseptal infarct, age indeterminate Baseline wander in lead(s) II no change from previous Confirmed by Arby BarrettePfeiffer, Marcy [phone]([id number]) on 08/13/2017 11:41:14 PM   Radiology Dg Chest 2 View  Result Date: 08/13/2017 CLINICAL DATA:  COPD and shortness of breath. EXAM: CHEST - 2 VIEW COMPARISON:  02/03/2013 FINDINGS: The lungs are hyperinflated with diffuse interstitial prominence. No focal airspace consolidation or pulmonary edema. No pleural effusion or pneumothorax. Normal  cardiomediastinal contours. IMPRESSION: COPD without acute airspace disease. Electronically Signed   By: Deatra RobinsonKevin  Herman M.D.   On: 08/13/2017 22:58    Procedures Procedures (including critical care time)  Medications Ordered in ED Medications  albuterol (PROVENTIL HFA;VENTOLIN HFA) 108 (90 Base) MCG/ACT inhaler 1 puff (has no administration in time range)     Initial Impression / Assessment and Plan / ED Course  I have reviewed the triage vital signs and the nursing notes.  Pertinent labs & imaging results that were available during my care of the patient were reviewed by me and considered in my medical decision making (see chart for details).     Patient presenting for evaluation of shortness of breath and urinary symptoms.  Physical exam reassuring, patient currently appears in no distress.  Sats in the upper 90s on room air.  Wheezing has resolved, patient reports symptoms are improving.  Will obtain chest x-ray to rule out infection.  Basic labs drawn.  Will obtain UA and postvoid bladder scan. Case discussed with attending, Dr. Donnald Garre agrees to plan.   Chest x-ray viewed and interpreted by me, shows COPD-like changes without signs of infection.  No pneumonia, pneumothorax, or effusions.  EKG without STEMI, unchanged from previous.  Basic labs reassuring, no leukocytosis.  Urine without infection, post void scan shows 129 cc.  Shortness of breath likely due to COPD exacerbation, treated mostly by EMS prior to arrival.  Pulse ox dropped to 88% while ambulating, likely pt's baseline considering her degree of COPD. Pulse ox returns to upper 90's upon resting. Will provide albuterol inhaler, as patient has no inhalers at home and prednisone burst.  Discussed smoking cessation and follow-up with PCP to restart preventative medications.  Patient's urinary incontinence has been going on for over a year.  Likely a combination of stress and urge incontinence.  No infection at this time.  Patient  offered resources for urology, states she will start with primary care.  At this time, patient appears safe for discharge.  Return precautions given.  Patient states she understands and agrees to plan.   Final Clinical Impressions(s) / ED Diagnoses   Final diagnoses:  COPD exacerbation (HCC)  Urinary incontinence, unspecified type    ED Discharge Orders    None       Alveria Apley, PA-C 08/14/17 0015    Arby Barrette, MD 08/21/17 [phone]

## 2017-08-13 NOTE — ED Provider Notes (Signed)
Medical screening examination/treatment/procedure(s) were conducted as a shared visit with non-physician practitioner(s) and myself.  I personally evaluated the patient during the encounter.  None Reports she has COPD.  Reports she ran out of her inhalers a while ago.  She continues to smoke about 10 cigarettes a day.  She reports that for about 4 5 days now she starting to get more shortness of breath, wheezing and feeling of aching in her lower ribs on both sides.  Reports she has been coughing.  No fever.  Patient is alert and in no distress.  Heart regular no gross murmur gallop.  Breath sounds soft throughout.  Abdomen soft and nontender.  Ribs slightly tender to compression both sides.  No lower extremity swelling or calf pain.  I agree with plan and management.   Arby BarrettePfeiffer, Rashad Auld, MD 08/13/17 2311

## 2017-08-14 MED ORDER — PREDNISONE 10 MG PO TABS: 40.0000 mg | ORAL_TABLET | Freq: Every day | ORAL | 0 refills | Status: AC

## 2017-08-14 NOTE — ED Notes (Addendum)
Pt ambulated with pulse oximetry all the way from room 18 all the way around the 9-25 side. Initial oxygen saturation was 96%, and it dropped down to around 88%. She felt shaky and was dyspneic. Her daughter stated that this is normal. Going up stairs will make her dyspneic.

## 2017-08-14 NOTE — Discharge Instructions (Addendum)
Use albuterol as needed for wheezing or shortness of breath. Take prednisone as prescribed. It is important that you decrease and stop smoking, as this will lessen your COPD symptoms. Follow-up with your primary care doctor next week for further evaluation of your symptoms. Return to the emergency room if you develop persistent difficulty breathing, you are urinating less than 2 times a day, or if any new or concerning symptoms.

## 2017-08-15 LAB — URINE CULTURE

## 2017-08-17 ENCOUNTER — Other Ambulatory Visit: Payer: Self-pay

## 2017-08-17 ENCOUNTER — Ambulatory Visit (INDEPENDENT_AMBULATORY_CARE_PROVIDER_SITE_OTHER): Payer: Medicare Other | Admitting: Physician Assistant

## 2017-08-17 ENCOUNTER — Encounter: Payer: Self-pay | Admitting: Physician Assistant

## 2017-08-17 VITALS — BP 135/92 | HR 57 | Temp 97.6°F | Ht 61.5 in | Wt 103.6 lb

## 2017-08-17 DIAGNOSIS — N3281 Overactive bladder: Secondary | ICD-10-CM

## 2017-08-17 DIAGNOSIS — J441 Chronic obstructive pulmonary disease with (acute) exacerbation: Secondary | ICD-10-CM

## 2017-08-17 MED ORDER — BECLOMETHASONE DIPROP HFA 80 MCG/ACT IN AERB: 1.0000 | INHALATION_SPRAY | Freq: Two times a day (BID) | RESPIRATORY_TRACT | 3 refills | Status: DC

## 2017-08-17 MED ORDER — OXYBUTYNIN CHLORIDE ER 5 MG PO TB24: 5.0000 mg | ORAL_TABLET | Freq: Every day | ORAL | 3 refills | Status: DC

## 2017-08-17 NOTE — Patient Instructions (Addendum)
Lets try a bladder antispasmodic for your symptoms.  Give it time to work and come back and see Dr. Creta LevinStallings at that time.   Lets start you on an inhaled corticosteroid for you COPD. Please follow up with Dr. Creta LevinStallings.     IF you received an x-ray today, you will receive an invoice from [facility]. Please contact [facility] at [phone] with questions or concerns regarding your invoice.   IF you received labwork today, you will receive an invoice from [facility]. Please contact [facility] at [phone] with questions or concerns regarding your invoice.   Our billing staff will not be able to assist you with questions regarding bills from these companies.  You will be contacted with the lab results as soon as they are available. The fastest way to get your results is to activate your My Chart account. Instructions are located on the last page of this paperwork. If you have not heard from us regarding the results in 2 weeks, please contact this office.

## 2017-08-17 NOTE — Progress Notes (Signed)
08/17/2017 12:54 PM   DOB: December 05, 1950 / MRN: [id number]  SUBJECTIVE:  Jasmin Munoz is a 67 y.o. female presenting for two problems. Seen in the ED recently and would like to start treatment for COPD with HPI as follows.   "Patient states she has a history of COPD, she has been out of her medications for several months.  She reports worsening shortness of breath for the past several days.  She was given multiple breathing treatments and Solu-Medrol with EMS, reports significant improvement of her symptoms.  She denies fevers, chills, cough, chest pain, nausea, vomiting, or abnormal bowel movements.  Additionally, patient states for the past year she has been having urinary incontinence.  Both stress and urge.  She is concerned that she needs a bladder suspension.  She denies dysuria or hematuria.  She states that occasionally she feels like she needs to pee, but has difficulty releasing the urine, while other times she is leaking urine.  She has not seen anybody about this.  Her primary care doctor is [name], she states she can follow-up with him next week.  Patient smokes approximately 10 cigarettes a day.  Denies alcohol or drug use.  Per EMS, patient initially had lots of wheezing upon arrival to the house."  She does not have time for breathing studies today and feels better with taking PO prednisone, but would like to take the next step in treating her COPD.  She continues to try and stop smoking and understands that this is the first step in treatment.   She complains of "needing to pee all the time."  This has been getting progressively worse over the last year. Her urine at the ED was negative for leuks and blood.  Urine culture negative.     She is allergic to sulfa antibiotics.   She  has a past medical history of Anxiety, Arthritis, Cataract, COPD (chronic obstructive pulmonary disease) (HCC), Depression, Fibromyalgia, Macular degeneration, and Seizures (HCC).    She   reports that she has been smoking cigarettes.  She has a 35.00 pack-year smoking history. She uses smokeless tobacco. She reports that she does not drink alcohol or use drugs. She  reports that she does not currently engage in sexual activity. The patient  has a past surgical history that includes Abdominal hysterectomy.  Her family history includes Arthritis in her father and sister; Emphysema in her mother; Joint hypermobility in her brother.  Review of Systems  Constitutional: Negative for chills, diaphoresis and fever.  Eyes: Negative.   Respiratory: Positive for wheezing (improving). Negative for cough, hemoptysis, sputum production and shortness of breath.   Cardiovascular: Negative for chest pain, orthopnea and leg swelling.  Gastrointestinal: Negative for nausea.  Genitourinary: Positive for frequency. Negative for dysuria, flank pain, hematuria and urgency.  Skin: Negative for rash.  Neurological: Negative for dizziness, sensory change, speech change, focal weakness and headaches.    The problem list and medications were reviewed and updated by myself where necessary and exist elsewhere in the encounter.   OBJECTIVE:  BP (!) 135/92 (BP Location: Left Arm, Patient Position: Sitting, Cuff Size: Normal)   Pulse (!) 57   Temp 97.6 F (36.4 C) (Oral)   Ht 5' 1.5" (1.562 m)   Wt 103 lb 9.6 oz (47 kg)   SpO2 99%   BMI 19.26 kg/m   Wt Readings from Last 3 Encounters:  08/17/17 103 lb 9.6 oz (47 kg)  08/13/17 100 lb (45.4 kg)  10/28/16  104 lb 6 oz (47.3 kg)   Temp Readings from Last 3 Encounters:  08/17/17 97.6 F (36.4 C) (Oral)  08/13/17 97.8 F (36.6 C) (Oral)  10/28/16 98.1 F (36.7 C) (Oral)   BP Readings from Last 3 Encounters:  08/17/17 (!) 135/92  08/13/17 119/83  10/28/16 120/82   Pulse Readings from Last 3 Encounters:  08/17/17 (!) 57  08/13/17 66  10/28/16 (!) 59    Physical Exam  Constitutional: She is oriented to person, place, and time. She appears  well-nourished.  Non-toxic appearance. No distress.  Eyes: Pupils are equal, round, and reactive to light. EOM are normal.  Cardiovascular: Normal rate, regular rhythm, S1 normal, S2 normal, normal heart sounds and intact distal pulses. Exam reveals no gallop, no friction rub and no decreased pulses.  No murmur heard. Pulmonary/Chest: Effort normal. No stridor. No respiratory distress. She has no wheezes. She has no rales.  Abdominal: She exhibits no distension.  Musculoskeletal: She exhibits no edema.  Neurological: She is alert and oriented to person, place, and time. She has normal strength and normal reflexes. She is not disoriented. She displays no atrophy. No cranial nerve deficit or sensory deficit. She exhibits normal muscle tone. Coordination and gait normal.  Skin: Skin is warm and dry. She is not diaphoretic. No pallor.  Psychiatric: She has a normal mood and affect. Her behavior is normal.  Vitals reviewed.   Lab Results  Component Value Date   WBC 8.2 08/13/2017   HGB 12.5 08/13/2017   HCT 39.3 08/13/2017   MCV 92.3 08/13/2017   PLT 220 08/13/2017    Lab Results  Component Value Date   CREATININE 0.68 08/13/2017   BUN 13 08/13/2017   NA 145 08/13/2017   K 3.4 (L) 08/13/2017   CL 109 08/13/2017   CO2 28 08/13/2017    Lab Results  Component Value Date   ALT 13 07/05/2016   AST 17 07/05/2016   ALKPHOS 101 07/05/2016   BILITOT 0.4 07/05/2016    Lab Results  Component Value Date   TSH 4.080 07/05/2016    Lab Results  Component Value Date   CHOL 193 07/05/2016   HDL 57 07/05/2016   LDLCALC 102 (H) 07/05/2016   TRIG 172 (H) 07/05/2016   CHOLHDL 3.4 07/05/2016     ASSESSMENT AND PLAN:  Jasmin Munoz was seen today for copd.  Diagnoses and all orders for this visit:  COPD exacerbation (HCC): Will quantify gold class at her next visit.  Lungs sound okay today.  Add qvar and she will continue rescue PRN. She will continue to try and reduce her smoking. -      beclomethasone (QVAR REDIHALER) 80 MCG/ACT inhaler; Inhale 1 puff into the lungs 2 (two) times daily.  Overactive bladder: Labs from ED okay.  Her story is consitent with bladder spasm and would like benefit for antispasmodic.  Will start low dose as a trial of medication.  -     oxybutynin (DITROPAN XL) 5 MG 24 hr tablet; Take 1 tablet (5 mg total) by mouth at bedtime.    The patient is advised to call or return to clinic if she does not see an improvement in symptoms, or to seek the care of the closest emergency department if she worsens with the above plan.   Deliah BostonMichael Zarif Rathje, MHS, PA-C [facility] 08/17/2017 12:54 PM

## 2017-08-18 ENCOUNTER — Telehealth: Payer: Self-pay | Admitting: Physician Assistant

## 2017-08-18 NOTE — Telephone Encounter (Signed)
Copied from CRM [id number]. Topic: Quick Communication - See Telephone Encounter >> Aug 18, 2017 12:46 PM Trula SladeWalter, Sherece F wrote: CRM for notification. See Telephone encounter for: 08/18/17. Patient wants to know if she can have a coupon for the oxybutynin (DITROPAN XL) 5 MG 24 hr tablet or prescribe something else because she can't afford this medication.  Please advise.

## 2017-08-18 NOTE — Telephone Encounter (Signed)
Message to Jasmin BostonMichael Munoz. Checked files and we do not have coupon for ditropan

## 2017-08-19 ENCOUNTER — Emergency Department (HOSPITAL_COMMUNITY): Payer: Medicare Other

## 2017-08-19 ENCOUNTER — Other Ambulatory Visit: Payer: Self-pay

## 2017-08-19 ENCOUNTER — Emergency Department (HOSPITAL_COMMUNITY)
Admission: EM | Admit: 2017-08-19 | Discharge: 2017-08-19 | Disposition: A | Discharge status: Home / Self Care | Payer: Medicare Other | Attending: Emergency Medicine | Admitting: Emergency Medicine

## 2017-08-19 ENCOUNTER — Encounter (HOSPITAL_COMMUNITY): Payer: Self-pay

## 2017-08-19 DIAGNOSIS — R310 Gross hematuria: Secondary | ICD-10-CM | POA: Diagnosis not present

## 2017-08-19 DIAGNOSIS — R10817 Generalized abdominal tenderness: Secondary | ICD-10-CM | POA: Diagnosis present

## 2017-08-19 DIAGNOSIS — R1084 Generalized abdominal pain: Secondary | ICD-10-CM | POA: Diagnosis not present

## 2017-08-19 DIAGNOSIS — I10 Essential (primary) hypertension: Secondary | ICD-10-CM | POA: Diagnosis not present

## 2017-08-19 DIAGNOSIS — J449 Chronic obstructive pulmonary disease, unspecified: Secondary | ICD-10-CM | POA: Insufficient documentation

## 2017-08-19 DIAGNOSIS — F17210 Nicotine dependence, cigarettes, uncomplicated: Secondary | ICD-10-CM | POA: Insufficient documentation

## 2017-08-19 DIAGNOSIS — R109 Unspecified abdominal pain: Secondary | ICD-10-CM

## 2017-08-19 DIAGNOSIS — R457 State of emotional shock and stress, unspecified: Secondary | ICD-10-CM | POA: Diagnosis not present

## 2017-08-19 DIAGNOSIS — N3090 Cystitis, unspecified without hematuria: Secondary | ICD-10-CM | POA: Diagnosis not present

## 2017-08-19 DIAGNOSIS — R300 Dysuria: Secondary | ICD-10-CM | POA: Diagnosis not present

## 2017-08-19 DIAGNOSIS — R0602 Shortness of breath: Secondary | ICD-10-CM | POA: Diagnosis not present

## 2017-08-19 DIAGNOSIS — N3001 Acute cystitis with hematuria: Secondary | ICD-10-CM | POA: Diagnosis not present

## 2017-08-19 DIAGNOSIS — R079 Chest pain, unspecified: Secondary | ICD-10-CM | POA: Diagnosis not present

## 2017-08-19 DIAGNOSIS — R52 Pain, unspecified: Secondary | ICD-10-CM | POA: Diagnosis not present

## 2017-08-19 DIAGNOSIS — N3091 Cystitis, unspecified with hematuria: Secondary | ICD-10-CM

## 2017-08-19 LAB — COMPREHENSIVE METABOLIC PANEL
ALK PHOS: 76 U/L (ref 38–126)
ALT: 19 U/L (ref 0–44)
ANION GAP: 6 (ref 5–15)
AST: 18 U/L (ref 15–41)
Albumin: 3.6 g/dL (ref 3.5–5.0)
BUN: 20 mg/dL (ref 8–23)
CALCIUM: 8.6 mg/dL — AB (ref 8.9–10.3)
CO2: 29 mmol/L (ref 22–32)
CREATININE: 0.67 mg/dL (ref 0.44–1.00)
Chloride: 107 mmol/L (ref 98–111)
Glucose, Bld: 97 mg/dL (ref 70–99)
Potassium: 3.4 mmol/L — ABNORMAL LOW (ref 3.5–5.1)
SODIUM: 142 mmol/L (ref 135–145)
TOTAL PROTEIN: 5.9 g/dL — AB (ref 6.5–8.1)
Total Bilirubin: 1 mg/dL (ref 0.3–1.2)

## 2017-08-19 LAB — URINALYSIS, ROUTINE W REFLEX MICROSCOPIC
BILIRUBIN URINE: NEGATIVE
Bacteria, UA: NONE SEEN
GLUCOSE, UA: NEGATIVE mg/dL
Ketones, ur: NEGATIVE mg/dL
LEUKOCYTES UA: NEGATIVE
Nitrite: POSITIVE — AB
Protein, ur: NEGATIVE mg/dL
Specific Gravity, Urine: 1.009 (ref 1.005–1.030)
pH: 6 (ref 5.0–8.0)

## 2017-08-19 LAB — CBC WITH DIFFERENTIAL/PLATELET
Basophils Absolute: 0 10*3/uL (ref 0.0–0.1)
Basophils Relative: 0 %
EOS ABS: 0.2 10*3/uL (ref 0.0–0.7)
EOS PCT: 3 %
HCT: 36.8 % (ref 36.0–46.0)
HEMOGLOBIN: 11.7 g/dL — AB (ref 12.0–15.0)
LYMPHS ABS: 2 10*3/uL (ref 0.7–4.0)
LYMPHS PCT: 23 %
MCH: 29.8 pg (ref 26.0–34.0)
MCHC: 31.8 g/dL (ref 30.0–36.0)
MCV: 93.6 fL (ref 78.0–100.0)
Monocytes Absolute: 0.6 10*3/uL (ref 0.1–1.0)
Monocytes Relative: 7 %
NEUTROS PCT: 67 %
Neutro Abs: 5.7 10*3/uL (ref 1.7–7.7)
PLATELETS: 211 10*3/uL (ref 150–400)
RBC: 3.93 MIL/uL (ref 3.87–5.11)
RDW: 13.2 % (ref 11.5–15.5)
WBC: 8.4 10*3/uL (ref 4.0–10.5)

## 2017-08-19 LAB — LIPASE, BLOOD: LIPASE: 33 U/L (ref 11–51)

## 2017-08-19 LAB — TROPONIN I

## 2017-08-19 MED ORDER — SODIUM CHLORIDE 0.9 % IV SOLN
1.0000 g | Freq: Once | INTRAVENOUS | Status: AC
  Administered 2017-08-19: 1 g via INTRAVENOUS
  Filled 2017-08-19: qty 10

## 2017-08-19 MED ORDER — IOPAMIDOL (ISOVUE-300) INJECTION 61%
INTRAVENOUS | Status: AC
  Filled 2017-08-19: qty 100

## 2017-08-19 MED ORDER — CEPHALEXIN 500 MG PO CAPS: 500.0000 mg | ORAL_CAPSULE | Freq: Three times a day (TID) | ORAL | 0 refills | Status: DC

## 2017-08-19 MED ORDER — IOPAMIDOL (ISOVUE-300) INJECTION 61%
100.0000 mL | Freq: Once | INTRAVENOUS | Status: AC | PRN
  Administered 2017-08-19: 100 mL via INTRAVENOUS

## 2017-08-19 MED ORDER — ACETAMINOPHEN 500 MG PO TABS
1000.0000 mg | ORAL_TABLET | Freq: Once | ORAL | Status: AC
  Administered 2017-08-19: 1000 mg via ORAL
  Filled 2017-08-19: qty 2

## 2017-08-19 NOTE — ED Notes (Signed)
Bed: WA03 Expected date:  Expected time:  Means of arrival:  Comments: 67 yo urinary retention

## 2017-08-19 NOTE — ED Notes (Signed)
Discharge instructions reviewed with patient. Patient verbalizes understanding. VSS.   

## 2017-08-19 NOTE — ED Triage Notes (Addendum)
Pt arrived via EMS from home. C/o inability to urinate over the past 24 hours. Has pain with urination. C/o chills, diaphoresis. Hx COPD (not on home oxygen) and anxiety. Pt was recently here Saturday night. Took a valium at home this morning-pt drowsy upon arrival. Moving all extremities. No SOB noted.  VS: 128/84, HR 60, SpO2 97% per EMS

## 2017-08-19 NOTE — ED Provider Notes (Signed)
[facility]-EMERGENCY DEPT Provider Note   CSN: [id number] Arrival date & time: 08/19/17  [id number]     History   Chief Complaint Chief Complaint  Patient presents with  . Abdominal Pain    HPI Jasmin Munoz is a 67 y.o. female.  HPI Patient is a 67 year old female presents to the emergency department with complaints of difficulty urinating over the past 24 hours with some dysuria.  She reports some abdominal pain as well with chills.  She denies documented fever.  No flank pain.  Reports some of her discomfort is in her upper abdomen.  Denies chest pain or shortness of breath.  Symptoms are mild to moderate in severity.  No use of anticoagulants  Past Medical History:  Diagnosis Date  . Anxiety   . Arthritis   . Cataract   . COPD (chronic obstructive pulmonary disease) (HCC)   . Depression   . Fibromyalgia   . Macular degeneration   . Seizures [facility](HCC)     Patient Active Problem List   Diagnosis Date Noted  . COPD (chronic obstructive pulmonary disease) (HCC) 02/03/2013  . COPD exacerbation (HCC) 02/03/2013  . Hypoxemia 02/03/2013  . Generalized anxiety disorder 02/03/2013  . HTN (hypertension) 02/03/2013  . Nonspecific abnormal electrocardiogram (ECG) (EKG) 02/03/2013  . DEPRESSION 05/24/2006  . TENDINITIS 05/24/2006    Past Surgical History:  Procedure Laterality Date  . ABDOMINAL HYSTERECTOMY       OB History   None      Home Medications    Prior to Admission medications   Medication Sig Start Date End Date Taking? Authorizing Provider  albuterol (PROVENTIL HFA;VENTOLIN HFA) 108 (90 BASE) MCG/ACT inhaler Inhale 1 puff into the lungs every 6 (six) hours as needed for wheezing or shortness of breath. 05/23/13  Yes Elvina SidleLauenstein, Kurt, MD  beclomethasone (QVAR REDIHALER) 80 MCG/ACT inhaler Inhale 1 puff into the lungs 2 (two) times daily. 08/17/17  Yes Ofilia Neaslark, Michael L, PA-C  diazepam (VALIUM) 10 MG tablet Take 1 tablet (10 mg total) by mouth  every 6 (six) hours as needed for anxiety. 05/23/13  Yes Elvina SidleLauenstein, Kurt, MD  FLUoxetine (PROZAC) 10 MG tablet Take 10 mg by mouth daily. 05/16/17  Yes [provider]  metoprolol tartrate (LOPRESSOR) 50 MG tablet Take 1 tablet (50 mg total) by mouth daily. Office visit needed for additional refills 06/06/17  Yes Deliah Bostonlark, Michael L, PA-C  naproxen sodium (ALEVE) 220 MG tablet Take 220 mg by mouth daily as needed (for pain).   Yes [provider]  oxybutynin (DITROPAN XL) 5 MG 24 hr tablet Take 1 tablet (5 mg total) by mouth at bedtime. Patient not taking: Reported on 08/19/2017 08/17/17   Ofilia Neaslark, Michael L, PA-C    Family History Family History  Problem Relation Age of Onset  . Emphysema Mother   . Arthritis Father   . Arthritis Sister   . Joint hypermobility Brother     Social History Social History   Tobacco Use  . Smoking status: Current Every Day Smoker    Packs/day: 1.00    Years: 35.00    Pack years: 35.00    Types: Cigarettes    Last attempt to quit: 01/27/2013    Years since quitting: 4.5  . Smokeless tobacco: Current User  Substance Use Topics  . Alcohol use: No  . Drug use: No     Allergies   Sulfa antibiotics   Review of Systems Review of Systems  All other systems reviewed and  are negative.    Physical Exam Updated Vital Signs BP 107/70   Pulse (!) 52   Temp 97.7 F (36.5 C) (Oral)   Resp 18   Ht 5\' 1"  (1.549 m)   Wt 46.7 kg (103 lb)   SpO2 98%   BMI 19.46 kg/m   Physical Exam  Constitutional: She is oriented to person, place, and time. She appears well-developed and well-nourished. No distress.  HENT:  Head: Normocephalic and atraumatic.  Eyes: EOM are normal.  Neck: Normal range of motion.  Cardiovascular: Normal rate, regular rhythm and normal heart sounds.  Pulmonary/Chest: Effort normal and breath sounds normal.  Abdominal: Soft. She exhibits no distension.  Mild generalized abdominal tenderness without guarding or rebound.   No focal area of tenderness  Musculoskeletal: Normal range of motion.  Neurological: She is alert and oriented to person, place, and time.  Skin: Skin is warm and dry.  Psychiatric: She has a normal mood and affect. Judgment normal.  Nursing note and vitals reviewed.    ED Treatments / Results  Labs (all labs ordered are listed, but only abnormal results are displayed) Labs Reviewed  CBC WITH DIFFERENTIAL/PLATELET - Abnormal; Notable for the following components:      Result Value   Hemoglobin 11.7 (*)    All other components within normal limits  URINALYSIS, ROUTINE W REFLEX MICROSCOPIC - Abnormal; Notable for the following components:   Color, Urine AMBER (*)    Hgb urine dipstick MODERATE (*)    Nitrite POSITIVE (*)    RBC / HPF >50 (*)    All other components within normal limits  COMPREHENSIVE METABOLIC PANEL - Abnormal; Notable for the following components:   Potassium 3.4 (*)    Calcium 8.6 (*)    Total Protein 5.9 (*)    All other components within normal limits  URINE CULTURE  LIPASE, BLOOD  TROPONIN I    EKG None  Radiology Dg Chest 2 View  Result Date: 08/19/2017 CLINICAL DATA:  Chest pain and shortness of breath. Inability to urinate. EXAM: CHEST - 2 VIEW COMPARISON:  08/13/2017 FINDINGS: Chronic hyperinflation and findings are most compatible with emphysema. No focal airspace disease or pulmonary edema. Heart and mediastinum are within normal limits. The trachea is midline. No large pleural effusions. Mild curvature in the thoracolumbar spine. Degenerate facet disease in lower cervical spine. IMPRESSION: No active cardiopulmonary disease. Stable hyperinflation and findings are suggestive for emphysema. Electronically Signed   By: Richarda Overlie M.D.   On: 08/19/2017 09:25   Ct Abdomen Pelvis W Contrast  Result Date: 08/19/2017 CLINICAL DATA:  Microscopic hematuria.  Difficulty with urination. EXAM: CT ABDOMEN AND PELVIS WITH CONTRAST TECHNIQUE: Multidetector CT  imaging of the abdomen and pelvis was performed using the standard protocol following bolus administration of intravenous contrast. CONTRAST:  ISOVUE-300 IOPAMIDOL (ISOVUE-300) INJECTION 61% COMPARISON:  None. FINDINGS: Lower chest: Normal Hepatobiliary: Normal except for an insignificant 1 cm cyst in the right lobe liver. No calcified gallstones. Pancreas: Normal Spleen: Normal Adrenals/Urinary Tract: Adrenal glands are normal. Kidneys are normal. No mass, stone or hydronephrosis. Contrast is present within the kidneys on the initial images. Therefore, small stones would be obscured. Bladder is distended and contains some air or gas. Presumably this is subsequent to previous catheterization. Stomach/Bowel: Normal Vascular/Lymphatic: Aortic atherosclerosis. No aneurysm. IVC is normal. No retroperitoneal adenopathy. Reproductive: Previous hysterectomy.  No pelvic mass. Other: No free fluid or air. Musculoskeletal: Curvature and degenerative change of the spine. IMPRESSION: No acute  finding by CT. No evidence of significant urinary tract pathology. Contrast is present on the initial imaging, therefore small renal stones might be inapparent. Certainly there is no evidence of passing stone hydronephrosis and no stone in the bladder. Air/gas in the bladder, presumably subsequent to catheterization. Electronically Signed   By: Paulina Fusi M.D.   On: 08/19/2017 11:35    Procedures Procedures (including critical care time)  Medications Ordered in ED Medications  iopamidol (ISOVUE-300) 61 % injection (has no administration in time range)  acetaminophen (TYLENOL) tablet 1,000 mg (1,000 mg Oral Given 08/19/17 0945)  iopamidol (ISOVUE-300) 61 % injection 100 mL (100 mLs Intravenous Contrast Given 08/19/17 1109)     Initial Impression / Assessment and Plan / ED Course  I have reviewed the triage vital signs and the nursing notes.  Pertinent labs & imaging results that were available during my care of the  patient were reviewed by me and considered in my medical decision making (see chart for details).     Nitrite positive.  Gross hematuria.  Likely hemorrhagic cystitis.  Bladder scan demonstrates 92 mL's.  CT scan pending at this time.  Patient will be covered for urinary tract infection/acute cystitis/hemorrhagic cystitis.  Urine culture sent.  Outpatient primary care and urology follow-up.  Patient understands return to the ER for new or worsening symptoms.  Home with Keflex.  Benefit from outpatient cystoscopy given the hematuria  Final Clinical Impressions(s) / ED Diagnoses   Final diagnoses:  Acute abdominal pain  Hemorrhagic cystitis    ED Discharge Orders    None       Azalia Bilis, MD 08/19/17 1303

## 2017-08-20 LAB — URINE CULTURE: CULTURE: NO GROWTH

## 2017-08-29 ENCOUNTER — Other Ambulatory Visit: Payer: Self-pay | Admitting: Physician Assistant

## 2017-08-29 MED ORDER — OXYBUTYNIN CHLORIDE 5 MG PO TABS: 2.5000 mg | ORAL_TABLET | Freq: Three times a day (TID) | ORAL | 0 refills | Status: DC

## 2017-08-29 NOTE — Telephone Encounter (Signed)
I've sent in the short acting. Please call and let her know. Deliah BostonMichael Clark, MS, PA-C 2:12 PM, 08/29/2017

## 2017-08-29 NOTE — Telephone Encounter (Signed)
Left detailed message.   

## 2017-09-13 ENCOUNTER — Emergency Department (HOSPITAL_COMMUNITY)
Admission: EM | Admit: 2017-09-13 | Discharge: 2017-09-13 | Disposition: A | Discharge status: Other Acute Inpatient Hospital | Payer: Medicare Other | Attending: Emergency Medicine | Admitting: Emergency Medicine

## 2017-09-13 ENCOUNTER — Inpatient Hospital Stay (HOSPITAL_COMMUNITY)
Admission: AD | Admit: 2017-09-13 | Discharge: 2017-09-15 | DRG: 885 | Disposition: A | Discharge status: Home / Self Care | Payer: Medicare Other | Source: Intra-hospital | Attending: Psychiatry | Admitting: Psychiatry

## 2017-09-13 ENCOUNTER — Other Ambulatory Visit: Payer: Self-pay

## 2017-09-13 ENCOUNTER — Encounter (HOSPITAL_COMMUNITY): Payer: Self-pay | Admitting: *Deleted

## 2017-09-13 ENCOUNTER — Encounter (HOSPITAL_COMMUNITY): Payer: Self-pay

## 2017-09-13 DIAGNOSIS — Z882 Allergy status to sulfonamides status: Secondary | ICD-10-CM | POA: Diagnosis not present

## 2017-09-13 DIAGNOSIS — F411 Generalized anxiety disorder: Secondary | ICD-10-CM | POA: Diagnosis present

## 2017-09-13 DIAGNOSIS — Z79899 Other long term (current) drug therapy: Secondary | ICD-10-CM | POA: Insufficient documentation

## 2017-09-13 DIAGNOSIS — Z825 Family history of asthma and other chronic lower respiratory diseases: Secondary | ICD-10-CM

## 2017-09-13 DIAGNOSIS — M1990 Unspecified osteoarthritis, unspecified site: Secondary | ICD-10-CM | POA: Diagnosis present

## 2017-09-13 DIAGNOSIS — H3530 Unspecified macular degeneration: Secondary | ICD-10-CM | POA: Diagnosis present

## 2017-09-13 DIAGNOSIS — Z791 Long term (current) use of non-steroidal anti-inflammatories (NSAID): Secondary | ICD-10-CM

## 2017-09-13 DIAGNOSIS — Z8261 Family history of arthritis: Secondary | ICD-10-CM

## 2017-09-13 DIAGNOSIS — J449 Chronic obstructive pulmonary disease, unspecified: Secondary | ICD-10-CM | POA: Insufficient documentation

## 2017-09-13 DIAGNOSIS — F17210 Nicotine dependence, cigarettes, uncomplicated: Secondary | ICD-10-CM | POA: Diagnosis present

## 2017-09-13 DIAGNOSIS — Y92015 Private garage of single-family (private) house as the place of occurrence of the external cause: Secondary | ICD-10-CM | POA: Insufficient documentation

## 2017-09-13 DIAGNOSIS — R45851 Suicidal ideations: Secondary | ICD-10-CM | POA: Diagnosis present

## 2017-09-13 DIAGNOSIS — F329 Major depressive disorder, single episode, unspecified: Secondary | ICD-10-CM | POA: Insufficient documentation

## 2017-09-13 DIAGNOSIS — G4700 Insomnia, unspecified: Secondary | ICD-10-CM | POA: Diagnosis present

## 2017-09-13 DIAGNOSIS — Y999 Unspecified external cause status: Secondary | ICD-10-CM | POA: Insufficient documentation

## 2017-09-13 DIAGNOSIS — H269 Unspecified cataract: Secondary | ICD-10-CM | POA: Diagnosis present

## 2017-09-13 DIAGNOSIS — F332 Major depressive disorder, recurrent severe without psychotic features: Secondary | ICD-10-CM | POA: Diagnosis not present

## 2017-09-13 DIAGNOSIS — T1491XA Suicide attempt, initial encounter: Secondary | ICD-10-CM | POA: Insufficient documentation

## 2017-09-13 DIAGNOSIS — I10 Essential (primary) hypertension: Secondary | ICD-10-CM | POA: Diagnosis present

## 2017-09-13 DIAGNOSIS — M797 Fibromyalgia: Secondary | ICD-10-CM | POA: Diagnosis present

## 2017-09-13 DIAGNOSIS — Y939 Activity, unspecified: Secondary | ICD-10-CM | POA: Insufficient documentation

## 2017-09-13 DIAGNOSIS — Z90710 Acquired absence of both cervix and uterus: Secondary | ICD-10-CM

## 2017-09-13 DIAGNOSIS — T582X2A Toxic effect of carbon monoxide from incomplete combustion of other domestic fuels, intentional self-harm, initial encounter: Secondary | ICD-10-CM | POA: Insufficient documentation

## 2017-09-13 DIAGNOSIS — F32A Depression, unspecified: Secondary | ICD-10-CM

## 2017-09-13 DIAGNOSIS — E785 Hyperlipidemia, unspecified: Secondary | ICD-10-CM | POA: Diagnosis present

## 2017-09-13 DIAGNOSIS — X58XXXA Exposure to other specified factors, initial encounter: Secondary | ICD-10-CM | POA: Insufficient documentation

## 2017-09-13 LAB — COMPREHENSIVE METABOLIC PANEL
ALK PHOS: 89 U/L (ref 38–126)
ALT: 12 U/L (ref 0–44)
AST: 18 U/L (ref 15–41)
Albumin: 4 g/dL (ref 3.5–5.0)
Anion gap: 8 (ref 5–15)
BILIRUBIN TOTAL: 0.2 mg/dL — AB (ref 0.3–1.2)
BUN: 14 mg/dL (ref 8–23)
CO2: 25 mmol/L (ref 22–32)
CREATININE: 0.72 mg/dL (ref 0.44–1.00)
Calcium: 9.2 mg/dL (ref 8.9–10.3)
Chloride: 109 mmol/L (ref 98–111)
GFR calc Af Amer: >60 mL/min (ref >60)
Glucose, Bld: 95 mg/dL (ref 70–99)
Potassium: 3.7 mmol/L (ref 3.5–5.1)
Sodium: 142 mmol/L (ref 135–145)
TOTAL PROTEIN: 6.7 g/dL (ref 6.5–8.1)

## 2017-09-13 LAB — RAPID URINE DRUG SCREEN, HOSP PERFORMED
AMPHETAMINES: NOT DETECTED
BARBITURATES: NOT DETECTED
Benzodiazepines: POSITIVE — AB
Cocaine: NOT DETECTED
Opiates: NOT DETECTED
Tetrahydrocannabinol: NOT DETECTED

## 2017-09-13 LAB — CBC
HEMATOCRIT: 37.7 % (ref 36.0–46.0)
Hemoglobin: 12.1 g/dL (ref 12.0–15.0)
MCH: 30 pg (ref 26.0–34.0)
MCHC: 32.1 g/dL (ref 30.0–36.0)
MCV: 93.3 fL (ref 78.0–100.0)
PLATELETS: 237 10*3/uL (ref 150–400)
RBC: 4.04 MIL/uL (ref 3.87–5.11)
RDW: 12.7 % (ref 11.5–15.5)
WBC: 7.2 10*3/uL (ref 4.0–10.5)

## 2017-09-13 LAB — SALICYLATE LEVEL: Salicylate Lvl: <7 mg/dL (ref 2.8–30.0)

## 2017-09-13 LAB — ETHANOL

## 2017-09-13 LAB — ACETAMINOPHEN LEVEL: Acetaminophen (Tylenol), Serum: <10 ug/mL — ABNORMAL LOW (ref 10–30)

## 2017-09-13 MED ORDER — NICOTINE 21 MG/24HR TD PT24
21.0000 mg | MEDICATED_PATCH | Freq: Every day | TRANSDERMAL | Status: DC
Start: 2017-09-13 — End: 2017-09-13
  Administered 2017-09-13: 21 mg via TRANSDERMAL
  Filled 2017-09-13 (×2): qty 1

## 2017-09-13 MED ORDER — NAPROXEN 375 MG PO TABS
375.0000 mg | ORAL_TABLET | Freq: Two times a day (BID) | ORAL | Status: DC
  Administered 2017-09-13 – 2017-09-15 (×4): 375 mg via ORAL
  Filled 2017-09-13 (×9): qty 1

## 2017-09-13 MED ORDER — DIAZEPAM 5 MG PO TABS
10.0000 mg | ORAL_TABLET | Freq: Two times a day (BID) | ORAL | Status: DC
  Administered 2017-09-13: 10 mg via ORAL
  Filled 2017-09-13 (×2): qty 2

## 2017-09-13 MED ORDER — TRAZODONE HCL 50 MG PO TABS
50.0000 mg | ORAL_TABLET | Freq: Every evening | ORAL | Status: DC | PRN
  Administered 2017-09-13: 50 mg via ORAL
  Filled 2017-09-13: qty 1

## 2017-09-13 MED ORDER — MAGNESIUM HYDROXIDE 400 MG/5ML PO SUSP: 30.0000 mL | Freq: Every day | ORAL | Status: DC | PRN

## 2017-09-13 MED ORDER — ALBUTEROL SULFATE (2.5 MG/3ML) 0.083% IN NEBU: 2.5000 mg | INHALATION_SOLUTION | Freq: Four times a day (QID) | RESPIRATORY_TRACT | Status: DC | PRN

## 2017-09-13 MED ORDER — CITALOPRAM HYDROBROMIDE 20 MG PO TABS
20.0000 mg | ORAL_TABLET | Freq: Every day | ORAL | Status: DC
  Administered 2017-09-13 – 2017-09-15 (×3): 20 mg via ORAL
  Filled 2017-09-13 (×6): qty 1

## 2017-09-13 MED ORDER — ALBUTEROL SULFATE HFA 108 (90 BASE) MCG/ACT IN AERS: 2.0000 | INHALATION_SPRAY | Freq: Four times a day (QID) | RESPIRATORY_TRACT | Status: DC | PRN

## 2017-09-13 MED ORDER — ALUM & MAG HYDROXIDE-SIMETH 200-200-20 MG/5ML PO SUSP: 30.0000 mL | ORAL | Status: DC | PRN

## 2017-09-13 MED ORDER — ACETAMINOPHEN 325 MG PO TABS
650.0000 mg | ORAL_TABLET | Freq: Four times a day (QID) | ORAL | Status: DC | PRN
  Administered 2017-09-13: 650 mg via ORAL
  Filled 2017-09-13: qty 2

## 2017-09-13 MED ORDER — FLUTICASONE FUROATE-VILANTEROL 100-25 MCG/INH IN AEPB
1.0000 | INHALATION_SPRAY | Freq: Every day | RESPIRATORY_TRACT | Status: DC
  Filled 2017-09-13 (×2): qty 28

## 2017-09-13 MED ORDER — NICOTINE 14 MG/24HR TD PT24
14.0000 mg | MEDICATED_PATCH | Freq: Every day | TRANSDERMAL | Status: DC
  Administered 2017-09-14: 14 mg via TRANSDERMAL
  Filled 2017-09-13 (×5): qty 1

## 2017-09-13 MED ORDER — METOPROLOL SUCCINATE ER 50 MG PO TB24
50.0000 mg | ORAL_TABLET | Freq: Every day | ORAL | Status: DC
  Administered 2017-09-13 – 2017-09-14 (×2): 50 mg via ORAL
  Filled 2017-09-13 (×6): qty 1

## 2017-09-13 NOTE — ED Notes (Signed)
Pt transported to [facility] by [facility]. All belongings returned to pt. Pt was calm and cooperative.

## 2017-09-13 NOTE — ED Provider Notes (Signed)
[facility]-EMERGENCY DEPT Provider Note  CSN: [id number] Arrival date & time: 09/13/17 0143  Chief Complaint(s) Psychiatric Evaluation  HPI Jasmin Munoz is a 67 y.o. female with a past medical history listed below including anxiety, depression who presents to the emergency department after being IVC by her daughter for a suicide attempt.  Patient escorted by [employer].  It was reported that patient attempted to commit suicide by sitting in her car while he was on with the garage door closed.  Patient called her daughter just prior to his stated that nothing mattered and to make sure that her husband was taking care of.  The patient endorsed having significant stress and feeling depressed due to her husband's condition who has Alzheimer's dementia.  She did admit to the suicide attempt.  Denied any homicidal ideations.  Denied any auditory/visual hallucinations.  Reported prior suicide attempt 20 years ago.  HPI  Past Medical History Past Medical History:  Diagnosis Date  . Anxiety   . Arthritis   . Cataract   . COPD (chronic obstructive pulmonary disease) (HCC)   . Depression   . Fibromyalgia   . Macular degeneration   . Seizures [facility])    Patient Active Problem List   Diagnosis Date Noted  . COPD (chronic obstructive pulmonary disease) (HCC) 02/03/2013  . COPD exacerbation (HCC) 02/03/2013  . Hypoxemia 02/03/2013  . Generalized anxiety disorder 02/03/2013  . HTN (hypertension) 02/03/2013  . Nonspecific abnormal electrocardiogram (ECG) (EKG) 02/03/2013  . DEPRESSION 05/24/2006  . TENDINITIS 05/24/2006   Home Medication(s) Prior to Admission medications   Medication Sig Start Date End Date Taking? Authorizing Provider  albuterol (PROVENTIL HFA;VENTOLIN HFA) 108 (90 BASE) MCG/ACT inhaler Inhale 1 puff into the lungs every 6 (six) hours as needed for wheezing or shortness of breath. 05/23/13  Yes Elvina Sidle, MD  beclomethasone (QVAR REDIHALER) 80 MCG/ACT  inhaler Inhale 1 puff into the lungs 2 (two) times daily. 08/17/17  Yes Ofilia Neas, PA-C  diazepam (VALIUM) 10 MG tablet Take 1 tablet (10 mg total) by mouth every 6 (six) hours as needed for anxiety. 05/23/13  Yes Elvina Sidle, MD  FLUoxetine (PROZAC) 10 MG tablet Take 10 mg by mouth daily. 05/16/17  Yes [provider]  metoprolol tartrate (LOPRESSOR) 50 MG tablet Take 1 tablet (50 mg total) by mouth daily. Office visit needed for additional refills 06/06/17  Yes [name] L, PA-C  naproxen sodium (ALEVE) 220 MG tablet Take 220 mg by mouth daily as needed (pain).    Yes [provider]  cephALEXin (KEFLEX) 500 MG capsule Take 1 capsule (500 mg total) by mouth 3 (three) times daily. Patient not taking: Reported on 09/13/2017 08/19/17   Azalia Bilis, MD  oxybutynin (DITROPAN) 5 MG tablet Take 0.5 tablets (2.5 mg total) by mouth 3 (three) times daily. Patient not taking: Reported on 09/13/2017 08/29/17   Ofilia Neas, PA-C  Past Surgical History Past Surgical History:  Procedure Laterality Date  . ABDOMINAL HYSTERECTOMY     Family History Family History  Problem Relation Age of Onset  . Emphysema Mother   . Arthritis Father   . Arthritis Sister   . Joint hypermobility Brother     Social History Social History   Tobacco Use  . Smoking status: Current Every Day Smoker    Packs/day: 1.00    Years: 35.00    Pack years: 35.00    Types: Cigarettes    Last attempt to quit: 01/27/2013    Years since quitting: 4.6  . Smokeless tobacco: Current User  Substance Use Topics  . Alcohol use: No  . Drug use: No   Allergies Sulfa antibiotics  Review of Systems Review of Systems All other systems are reviewed and are negative for acute change except as noted in the HPI  Physical Exam Vital Signs  I have reviewed the triage vital  signs BP (!) 133/91 (BP Location: Right Arm)   Pulse 60   Temp 97.6 F (36.4 C) (Oral)   Resp 14   SpO2 99%   Physical Exam  Constitutional: She is oriented to person, place, and time. She appears well-developed and well-nourished. No distress.  HENT:  Head: Normocephalic and atraumatic.  Nose: Nose normal.  Eyes: Pupils are equal, round, and reactive to light. Conjunctivae and EOM are normal. Right eye exhibits no discharge. Left eye exhibits no discharge. No scleral icterus.  Neck: Normal range of motion. Neck supple.  Cardiovascular: Normal rate and regular rhythm. Exam reveals no gallop and no friction rub.  No murmur heard. Pulmonary/Chest: Effort normal and breath sounds normal. No stridor. No respiratory distress. She has no rales.  Abdominal: Soft. She exhibits no distension. There is no tenderness.  Musculoskeletal: She exhibits no edema or tenderness.  Neurological: She is alert and oriented to person, place, and time.  Skin: Skin is warm and dry. No rash noted. She is not diaphoretic. No erythema.  Psychiatric: She has a normal mood and affect.  Vitals reviewed.   ED Results and Treatments Labs (all labs ordered are listed, but only abnormal results are displayed) Labs Reviewed  COMPREHENSIVE METABOLIC PANEL - Abnormal; Notable for the following components:      Result Value   Total Bilirubin 0.2 (*)    All other components within normal limits  ACETAMINOPHEN LEVEL - Abnormal; Notable for the following components:   Acetaminophen (Tylenol), Serum <10 (*)    All other components within normal limits  RAPID URINE DRUG SCREEN, HOSP PERFORMED - Abnormal; Notable for the following components:   Benzodiazepines POSITIVE (*)    All other components within normal limits  ETHANOL  SALICYLATE LEVEL  CBC                                                                                                                         EKG  EKG Interpretation  Date/Time:  Ventricular Rate:    PR Interval:    QRS Duration:   QT Interval:    QTC Calculation:   R Axis:     Text Interpretation:        Radiology No results found. Pertinent labs & imaging results that were available during my care of the patient were reviewed by me and considered in my medical decision making (see chart for details).  Medications Ordered in ED Medications - No data to display                                                                                                                                  Procedures Procedures  (including critical care time)  Medical Decision Making / ED Course I have reviewed the nursing notes for this encounter and the patient's prior records (if available in EHR or on provided paperwork).    Suicide attempt.  IVC upheld.  Screening labs reassuring.  She is well-appearing, alert and oriented x4.  No apparent sequela for home carbon monoxide.   Medically cleared for behavioral health evaluation and management.  Final Clinical Impression(s) / ED Diagnoses Final diagnoses:  Depression, unspecified depression type  Suicide attempt [facility](HCC)      This chart was dictated using voice recognition software.  Despite best efforts to proofread,  errors can occur which can change the documentation meaning.   Nira Connardama, Frankye Schwegel Eduardo, MD 09/13/17 [phone]

## 2017-09-13 NOTE — Progress Notes (Signed)
Patient ID: Jasmin Munoz, female   DOB: 02/25/1950, 67 y.o.   MRN: [id number] PER STATE REGULATIONS 482.30  THIS CHART WAS REVIEWED FOR MEDICAL NECESSITY WITH RESPECT TO THE PATIENT'S ADMISSION/DURATION OF STAY.  NEXT REVIEW DATE:09/17/17  Loura HaltBARBARA Jo Cerone, RN, BSN CASE MANAGER

## 2017-09-13 NOTE — ED Notes (Signed)
Called [employer] for transport to [facility] 

## 2017-09-13 NOTE — ED Notes (Signed)
ED Provider at bedside, [facility]

## 2017-09-13 NOTE — BH Assessment (Signed)
[facility]  Per Juanetta BeetsJacqueline Norman, DO, this pt requires psychiatric hospitalization.  Malva LimesLinsey Strader, RN, [facility]  Pt presents under IVC initiated by pt's daughter, and upheld by EDP Drema PryPedro Cardama, MD, and [facility].  Pt's nurse, Diane, has been notified, and agrees to call report to [phone].  Pt is to be transported via [employer].   Doylene Canninghomas Nicolena Schurman, [place] Behavioral Health Coordinator [phone]

## 2017-09-13 NOTE — ED Notes (Signed)
Bed: [facility]4 Expected date:  Expected time:  Means of arrival:  Comments: 

## 2017-09-13 NOTE — Progress Notes (Signed)
Adult Psychoeducational Group Note  Date:  09/13/2017 Time:  9:14 PM  Group Topic/Focus:  Wrap-Up Group:   The focus of this group is to help patients review their daily goal of treatment and discuss progress on daily workbooks.  Participation Level:  Did Not Attend      Gerrit HeckMcKenzie, Danett Palazzo Lee 09/13/2017, 9:14 PM

## 2017-09-13 NOTE — ED Notes (Signed)
Pt refused vitals signs and to sign for her belongings. Pt had one belongings bag and an envelope with money in it.

## 2017-09-13 NOTE — ED Triage Notes (Signed)
Pt daughter called [employer] stating the mother called her with suicidal intentions and told her "nothing matters and take care of my husband". She was found by [employer] lying in the back of her car with all the window up, car running, and the garage door was closed. Per [employer], pt daughter is seeking IVC from the magistrate at this time.

## 2017-09-13 NOTE — ED Notes (Signed)
Patient is irritable and withdrawn and does not want to converse with this Clinical research associatewriter. Plan of care discussed. Encouragement and support provided and safety maintain. Q 15 min safety checks in place and video monitoring.

## 2017-09-13 NOTE — H&P (Signed)
Psychiatric Admission Assessment Adult  Patient Identification: Jasmin Munoz MRN:  [id number] Date of Evaluation:  09/13/2017 Chief Complaint:  MDD Principal Diagnosis: <principal problem not specified> Diagnosis:   Patient Active Problem List   Diagnosis Date Noted  . MDD (major depressive disorder), recurrent episode, severe ([place]) [F33.2] 09/13/2017  . COPD (chronic obstructive pulmonary disease) ([place]) [J44.9] 02/03/2013  . COPD exacerbation ([place]) [J44.1] 02/03/2013  . Hypoxemia [R09.02] 02/03/2013  . Generalized anxiety disorder [F41.1] 02/03/2013  . HTN (hypertension) [I10] 02/03/2013  . Nonspecific abnormal electrocardiogram (ECG) (EKG) [R94.31] 02/03/2013  . DEPRESSION [F32.9] 05/24/2006  . TENDINITIS [M77.9] 05/24/2006   History of Present Illness: Patient is seen and examined.  Patient is a 67 year old female with a past psychiatric history significant for major depression as well as generalized anxiety disorder.  The patient was placed under involuntary commitment today.  Apparently the patient had been found by [facility] police lying in the back of her car with all the windows rolled up, the car was running, and her garage door been closed.  The patient admits she was trying to kill her self.  She stated she has had multiple stressors recently and is having trouble coping with them.  She stated her husband has Alzheimer's disease and has for several years.  He is in a memory care unit.  She also stated that she does not get along with her daughter who lives relatively close to her.  She feels like her daughter spends more time smoking marijuana and watching videos on YouTube.  She also stated that her grandson is very intelligent, and her daughter does not have any idea on how to help him.  She has been seeing Dr. Toy Cookey in [place] for her depression.  He prescribes fluoxetine and diazepam for her.  Her last psychiatric hospitalization was in 2001 in our institution.  She has  previously taken fluoxetine, diazepam, Paxil, Pamelor.  She developed a seizure after she had taken Pamelor in the past.  She has a history of hypertension, hyperlipidemia as well as COPD.  She still smokes 10 cigarettes a day.  She admits to helplessness, hopelessness and worthlessness. Associated Signs/Symptoms: Depression Symptoms:  depressed mood, anhedonia, insomnia, psychomotor agitation, fatigue, feelings of worthlessness/guilt, difficulty concentrating, hopelessness, suicidal thoughts with specific plan, suicidal attempt, anxiety, loss of energy/fatigue, disturbed sleep, weight loss, (Hypo) Manic Symptoms:  Impulsivity, Anxiety Symptoms:  Excessive Worry, Psychotic Symptoms:  Denied PTSD Symptoms: Negative Total Time spent with patient: 30 minutes  Past Psychiatric History: Patient is had depression for many years.  She had a psychiatric hospitalization back in 2001 at our institution.  She is been previously treated with Pamelor, Paxil, fluoxetine, Valium, Klonopin.  Is the patient at risk to self? Yes.    Has the patient been a risk to self in the past 6 months? Yes.    Has the patient been a risk to self within the distant past? No.  Is the patient a risk to others? No.  Has the patient been a risk to others in the past 6 months? No.  Has the patient been a risk to others within the distant past? No.   Prior Inpatient Therapy:   Prior Outpatient Therapy:    Alcohol Screening: 1. How often do you have a drink containing alcohol?: Never 2. How many drinks containing alcohol do you have on a typical day when you are drinking?: 1 or 2 3. How often do you have six or more drinks on one occasion?: Never  AUDIT-C Score: 0 9. Have you or someone else been injured as a result of your drinking?: No 10. Has a relative or friend or a doctor or another health worker been concerned about your drinking or suggested you cut down?: No Intervention/Follow-up: AUDIT Score <7 follow-up  not indicated Substance Abuse History in the last 12 months:  No. Consequences of Substance Abuse: Negative Previous Psychotropic Medications: Yes  Psychological Evaluations: Yes  Past Medical History:  Past Medical History:  Diagnosis Date  . Anxiety   . Arthritis   . Cataract   . COPD (chronic obstructive pulmonary disease) ([place])   . Depression   . Fibromyalgia   . Macular degeneration   . Seizures ([place])     Past Surgical History:  Procedure Laterality Date  . ABDOMINAL HYSTERECTOMY     Family History:  Family History  Problem Relation Age of Onset  . Emphysema Mother   . Arthritis Father   . Arthritis Sister   . Joint hypermobility Brother    Family Psychiatric  History: Denied Tobacco Screening: Have you used any form of tobacco in the last 30 days? (Cigarettes, Smokeless Tobacco, Cigars, and/or Pipes): Yes Tobacco use, Select all that apply: 5 or more cigarettes per day Are you interested in Tobacco Cessation Medications?: No, patient refused Counseled patient on smoking cessation including recognizing danger situations, developing coping skills and basic information about quitting provided: Refused/Declined practical counseling Social History:  Social History   Substance and Sexual Activity  Alcohol Use No     Social History   Substance and Sexual Activity  Drug Use No    Additional Social History:                           Allergies:   Allergies  Allergen Reactions  . Sulfa Antibiotics Hives   Lab Results:  Results for orders placed or performed during the hospital encounter of 09/13/17 (from the past 48 hour(s))  Comprehensive metabolic panel     Status: Abnormal   Collection Time: 09/13/17  2:22 AM  Result Value Ref Range   Sodium 142 135 - 145 mmol/L   Potassium 3.7 3.5 - 5.1 mmol/L   Chloride 109 98 - 111 mmol/L   CO2 25 22 - 32 mmol/L   Glucose, Bld 95 70 - 99 mg/dL   BUN 14 8 - 23 mg/dL   Creatinine, Ser 0.72 0.44 - 1.00 mg/dL    Calcium 9.2 8.9 - 10.3 mg/dL   Total Protein 6.7 6.5 - 8.1 g/dL   Albumin 4.0 3.5 - 5.0 g/dL   AST 18 15 - 41 U/L   ALT 12 0 - 44 U/L   Alkaline Phosphatase 89 38 - 126 U/L   Total Bilirubin 0.2 (L) 0.3 - 1.2 mg/dL   GFR calc non Af Amer >60 >60 mL/min   GFR calc Af Amer >60 >60 mL/min    Comment: (NOTE) The eGFR has been calculated using the CKD EPI equation. This calculation has not been validated in all clinical situations. eGFR's persistently <60 mL/min signify possible Chronic Kidney Disease.    Anion gap 8 5 - 15    Comment: Performed at [facility], [place] [address], [place], [place] [address]  Ethanol     Status: None   Collection Time: 09/13/17  2:22 AM  Result Value Ref Range   Alcohol, Ethyl (B) <10 <10 mg/dL    Comment: (NOTE) Lowest detectable limit for  serum alcohol is 10 mg/dL. For medical purposes only. Performed at [facility], [place] [address] [place], [place] [address]   Salicylate level     Status: None   Collection Time: 09/13/17  2:22 AM  Result Value Ref Range   Salicylate Lvl <0.7 2.8 - 30.0 mg/dL    Comment: Performed at [facility], [place] [address], [place], [place] [address]  Acetaminophen level     Status: Abnormal   Collection Time: 09/13/17  2:22 AM  Result Value Ref Range   Acetaminophen (Tylenol), Serum <10 (L) 10 - 30 ug/mL    Comment: (NOTE) Therapeutic concentrations vary significantly. A range of 10-30 ug/mL  may be an effective concentration for many patients. However, some  are best treated at concentrations outside of this range. Acetaminophen concentrations >150 ug/mL at 4 hours after ingestion  and >50 ug/mL at 12 hours after ingestion are often associated with  toxic reactions. Performed at [facility], [place] [address] [place], [place] [address]   cbc     Status: None   Collection Time: 09/13/17  2:22 AM  Result Value Ref Range   WBC 7.2 4.0 - 10.5  K/uL   RBC 4.04 3.87 - 5.11 MIL/uL   Hemoglobin 12.1 12.0 - 15.0 g/dL   HCT 37.7 36.0 - 46.0 %   MCV 93.3 78.0 - 100.0 fL   MCH 30.0 26.0 - 34.0 pg   MCHC 32.1 30.0 - 36.0 g/dL   RDW 12.7 11.5 - 15.5 %   Platelets 237 150 - 400 K/uL    Comment: Performed at [facility], [place] [address], [place], [place] [address]  Rapid urine drug screen (hospital performed)     Status: Abnormal   Collection Time: 09/13/17  2:23 AM  Result Value Ref Range   Opiates NONE DETECTED NONE DETECTED   Cocaine NONE DETECTED NONE DETECTED   Benzodiazepines POSITIVE (A) NONE DETECTED   Amphetamines NONE DETECTED NONE DETECTED   Tetrahydrocannabinol NONE DETECTED NONE DETECTED   Barbiturates NONE DETECTED NONE DETECTED    Comment: (NOTE) DRUG SCREEN FOR MEDICAL PURPOSES ONLY.  IF CONFIRMATION IS NEEDED FOR ANY PURPOSE, NOTIFY LAB WITHIN 5 DAYS. LOWEST DETECTABLE LIMITS FOR URINE DRUG SCREEN Drug Class                     Cutoff (ng/mL) Amphetamine and metabolites    1000 Barbiturate and metabolites    200 Benzodiazepine                 641 Tricyclics and metabolites     300 Opiates and metabolites        300 Cocaine and metabolites        300 THC                            50 Performed at [facility], [place] [address], [place], [place] [address]     Blood Alcohol level:  Lab Results  Component Value Date   ETH <10 40/76/8088    Metabolic Disorder Labs:  No results found for: HGBA1C, MPG No results found for: PROLACTIN Lab Results  Component Value Date   CHOL 193 07/05/2016   TRIG 172 (H) 07/05/2016   HDL 57 07/05/2016   CHOLHDL 3.4 07/05/2016   LDLCALC 102 (H) 07/05/2016    Current Medications: Current Facility-Administered Medications  Medication Dose Route Frequency Provider  Last Rate Last Dose  . acetaminophen (TYLENOL) tablet 650 mg  650 mg Oral Q6H PRN Nanci Pina, FNP      . albuterol (PROVENTIL HFA;VENTOLIN HFA) 108 (90 Base) MCG/ACT  inhaler 2 puff  2 puff Inhalation Q6H PRN Sharma Covert, MD      . albuterol (PROVENTIL) (2.5 MG/3ML) 0.083% nebulizer solution 2.5 mg  2.5 mg Nebulization Q6H PRN Sharma Covert, MD      . alum & mag hydroxide-simeth (MAALOX/MYLANTA) 200-200-20 MG/5ML suspension 30 mL  30 mL Oral Q4H PRN Nanci Pina, FNP      . citalopram (CELEXA) tablet 20 mg  20 mg Oral Daily Sharma Covert, MD      . diazepam (VALIUM) tablet 10 mg  10 mg Oral BID Sharma Covert, MD      . fluticasone furoate-vilanterol (BREO ELLIPTA) 100-25 MCG/INH 1 puff  1 puff Inhalation Daily Sharma Covert, MD      . magnesium hydroxide (MILK OF MAGNESIA) suspension 30 mL  30 mL Oral Daily PRN Nanci Pina, FNP      . metoprolol succinate (TOPROL-XL) 24 hr tablet 50 mg  50 mg Oral Daily Sharma Covert, MD      . naproxen (NAPROSYN) tablet 375 mg  375 mg Oral BID WC Sharma Covert, MD      . nicotine (NICODERM CQ - dosed in mg/24 hours) patch 14 mg  14 mg Transdermal Daily Sharma Covert, MD      . traZODone (DESYREL) tablet 50 mg  50 mg Oral QHS PRN Nanci Pina, FNP       PTA Medications: Medications Prior to Admission  Medication Sig Dispense Refill Last Dose  . albuterol (PROVENTIL HFA;VENTOLIN HFA) 108 (90 BASE) MCG/ACT inhaler Inhale 1 puff into the lungs every 6 (six) hours as needed for wheezing or shortness of breath. 6.7 g 11 08/19/2017 at Unknown time  . beclomethasone (QVAR REDIHALER) 80 MCG/ACT inhaler Inhale 1 puff into the lungs 2 (two) times daily. 10.6 g 3 09/12/2017  . cephALEXin (KEFLEX) 500 MG capsule Take 1 capsule (500 mg total) by mouth 3 (three) times daily. (Patient not taking: Reported on 09/13/2017) 21 capsule 0 Completed Course at Unknown time  . diazepam (VALIUM) 10 MG tablet Take 1 tablet (10 mg total) by mouth every 6 (six) hours as needed for anxiety. 30 tablet 5 09/12/2017  . FLUoxetine (PROZAC) 10 MG tablet Take 10 mg by mouth daily.  99 09/12/2017  . metoprolol  tartrate (LOPRESSOR) 50 MG tablet Take 1 tablet (50 mg total) by mouth daily. Office visit needed for additional refills 90 tablet 0 09/12/2017  . naproxen sodium (ALEVE) 220 MG tablet Take 220 mg by mouth daily as needed (pain).    08/19/2017 at Unknown time  . oxybutynin (DITROPAN) 5 MG tablet Take 0.5 tablets (2.5 mg total) by mouth 3 (three) times daily. (Patient not taking: Reported on 09/13/2017) 90 tablet 0 Not Taking at Unknown time    Musculoskeletal: Strength & Muscle Tone: within normal limits Gait & Station: normal Patient leans: N/A  Psychiatric Specialty Exam: Physical Exam  Nursing note and vitals reviewed. Constitutional: She is oriented to person, place, and time. She appears well-developed.  HENT:  Head: Normocephalic and atraumatic.  Respiratory: Effort normal.  Neurological: She is alert and oriented to person, place, and time.    ROS  Blood pressure (!) 122/93, pulse (!) 16, temperature 97.6 F (36.4 C), temperature source  Oral, resp. rate 16, height 5' 2"  (1.575 m), weight 45.4 kg, SpO2 100 %.Body mass index is 18.29 kg/m.  General Appearance: Disheveled  Eye Contact:  Fair  Speech:  Normal Rate  Volume:  Decreased  Mood:  Anxious and Depressed  Affect:  Congruent  Thought Process:  Coherent  Orientation:  Full (Time, Place, and Person)  Thought Content:  Logical  Suicidal Thoughts:  Yes.  with intent/plan  Homicidal Thoughts:  No  Memory:  Immediate;   Fair Recent;   Fair Remote;   Fair  Judgement:  Impaired  Insight:  Lacking  Psychomotor Activity:  Increased  Concentration:  Concentration: Fair and Attention Span: Fair  Recall:  [employer] of Knowledge:  Fair  Language:  Fair  Akathisia:  Negative  Handed:  Right  AIMS (if indicated):     Assets:  Communication Skills Desire for Improvement Financial Resources/Insurance Housing Resilience Talents/Skills  ADL's:  Intact  Cognition:  WNL  Sleep:       Treatment Plan Summary: Daily contact  with patient to assess and evaluate symptoms and progress in treatment, Medication management and Plan Patient is seen and examined.  Patient is a 67 year old female with the above-stated past psychiatric history who was admitted to the hospital after attempted suffocation and CO poisoning.  She will be admitted to the psychiatric unit.  She has been on fluoxetine 40 mg p.o. daily.  She does not think that it was effective at 40 mg.  With her age and comorbidities I think it would be better to switch medications.  I will place her on Celexa 20 mg p.o. daily.  Her outside psychiatrist has her on Valium 10 mg p.o. twice daily.  We will continue that at least for now.  She also has a history of significant COPD, and we will continue her inhalers as well as a nebulizer.  She also has hypertension and the metoprolol will be continued.  She takes Aleve for her arthritic pain.  I will place her on Naprosyn twice a day.  Her laboratories were reviewed and were essentially normal.  Her urinalysis does showed nitrate positive.  There are no bacteria present.  There were greater than 50 red blood cells.  0-5 white blood cells.  She may have a kidney stone.  Her drug screen was positive for benzodiazepines, but she does take Valium chronically.  Hopefully we can get her straightened out.  Observation Level/Precautions:  15 minute checks  Laboratory:  Chemistry Profile  Psychotherapy:    Medications:    Consultations:    Discharge Concerns:    Estimated LOS:  Other:     Physician Treatment Plan for Primary Diagnosis: <principal problem not specified> Long Term Goal(s): Improvement in symptoms so as ready for discharge  Short Term Goals: Ability to identify changes in lifestyle to reduce recurrence of condition will improve, Ability to verbalize feelings will improve, Ability to disclose and discuss suicidal ideas, Ability to demonstrate self-control will improve, Ability to identify and develop effective coping  behaviors will improve and Ability to maintain clinical measurements within normal limits will improve  Physician Treatment Plan for Secondary Diagnosis: Active Problems:   MDD (major depressive disorder), recurrent episode, severe ([place])  Long Term Goal(s): Improvement in symptoms so as ready for discharge  Short Term Goals: Ability to identify changes in lifestyle to reduce recurrence of condition will improve, Ability to verbalize feelings will improve, Ability to disclose and discuss suicidal ideas, Ability to demonstrate self-control will  improve, Ability to identify and develop effective coping behaviors will improve and Ability to maintain clinical measurements within normal limits will improve  I certify that inpatient services furnished can reasonably be expected to improve the patient's condition.    Sharma Covert, MD 8/13/20194:11 PM

## 2017-09-13 NOTE — ED Notes (Signed)
Pt belongings have been removed, changed into scrubs, and wanded by security.

## 2017-09-13 NOTE — Progress Notes (Signed)
Writer introduced self to pt. Writer discussed code number, visitation hours and the unit expectations with pt. Pt verbalized understanding of the information provided. Verbal support provided to pt. Orders reviewed with pt. No concerns verbalized by pt at this time.

## 2017-09-13 NOTE — [facility] Suicide Risk Assessment (Signed)
[facility]   Nursing information obtained from:  Patient Demographic factors:  Age 67 or older, Caucasian Current Mental Status:  NA Loss Factors:  Loss of significant relationship Historical Factors:  Prior suicide attempts Risk Reduction Factors:  Living with another person, especially a relative, Positive therapeutic relationship, Religious beliefs about death  Total Time spent with patient: 30 minutes Principal Problem: <principal problem not specified> Diagnosis:   Patient Active Problem List   Diagnosis Date Noted  . MDD (major depressive disorder), recurrent episode, severe ([facility]) [F33.2] 09/13/2017  . COPD (chronic obstructive pulmonary disease) ([facility]) [J44.9] 02/03/2013  . COPD exacerbation ([facility]) [J44.1] 02/03/2013  . Hypoxemia [R09.02] 02/03/2013  . Generalized anxiety disorder [F41.1] 02/03/2013  . HTN (hypertension) [I10] 02/03/2013  . Nonspecific abnormal electrocardiogram (ECG) (EKG) [R94.31] 02/03/2013  . DEPRESSION [F32.9] 05/24/2006  . TENDINITIS [M77.9] 05/24/2006   Subjective Data: Patient is seen and examined.  Patient is a 67 year old female with a past psychiatric history significant for major depression as well as generalized anxiety disorder.  The patient was placed under involuntary commitment.  The involuntary commitment paperwork and notes revealed that [place] [facility] [place] [place] in the back of her car with all the windows up, car running, and the garage door closed.  The patient admits that she was trying to kill her self.  She stated that she has multiple stressors that she is having trouble coping with.  She stated her husband has Alzheimer's disease, and has for several years.  He is in a memory care unit.  She also stated that she does not get along with her daughter.  She feels like her daughter spends more time smoking marijuana and watching videos on YouTube.  She also stated that her grandson is very  intelligent, and that his daughter is has no idea how to help him.  She has a past psychiatric history as stated above.  [place].  Her last psychiatric hospitalization was in 2001 at our institution.  She currently is taking fluoxetine, diazepam and medications for COPD as well as hypertension.  She developed a seizure after she had taken Pamelor in the past.  She also has previously taken Paxil but was not successful.  She was admitted to the hospital for evaluation and stabilization.  Continued Clinical Symptoms:    The "Alcohol Use Disorders Identification Test", Guidelines for Use in Primary Care, Second Edition.  World Science writerHealth Organization [facility](WHO). Score between 0-7:  no or low risk or alcohol related problems. Score between 8-15:  moderate risk of alcohol related problems. Score between 16-19:  high risk of alcohol related problems. Score 20 or above:  warrants further diagnostic evaluation for alcohol dependence and treatment.   CLINICAL FACTORS:   Severe Anxiety and/or Agitation Depression:   Anhedonia Hopelessness Impulsivity Insomnia   Musculoskeletal: Strength & Muscle Tone: within normal limits Gait & Station: normal Patient leans: N/A  Psychiatric Specialty Exam: Physical Exam  Constitutional: She is oriented to person, place, and time. She appears well-developed and well-nourished.  HENT:  Head: Normocephalic and atraumatic.  Respiratory: Effort normal.  Neurological: She is alert and oriented to person, place, and time.    ROS  Blood pressure (!) 122/93, pulse (!) 16, temperature 97.6 F (36.4 C), temperature source Oral, resp. rate 16, height 5\' 2"  (1.575 m), weight 45.4 kg, SpO2 100 %.Body mass index is 18.29 kg/m.  General Appearance: Disheveled  Eye Contact:  Fair  Speech:  Normal Rate  Volume:  Normal  Mood:  Anxious and Depressed  Affect:  Congruent  Thought Process:  Goal Directed  Orientation:  Full (Time, Place, and  Person)  Thought Content:  Logical  Suicidal Thoughts:  Yes.  with intent/plan  Homicidal Thoughts:  No  Memory:  Immediate;   Fair Recent;   Fair Remote;   Fair  Judgement:  Impaired  Insight:  Lacking  Psychomotor Activity:  Increased  Concentration:  Concentration: Fair and Attention Span: Fair  Recall:  [employer]  Fund of Knowledge:  Fair  Language:  Fair  Akathisia:  Negative  Handed:  Right  AIMS (if indicated):     Assets:  Desire for Improvement Financial Resources/Insurance Housing Resilience Talents/Skills  ADL's:  Intact  Cognition:  WNL  Sleep:         COGNITIVE FEATURES THAT CONTRIBUTE TO RISK:  None    SUICIDE RISK:   Moderate:  Frequent suicidal ideation with limited intensity, and duration, some specificity in terms of plans, no associated intent, good self-control, limited dysphoria/symptomatology, some risk factors present, and identifiable protective factors, including available and accessible social support.  PLAN OF CARE: Patient is seen and examined.  Patient is a 67 year old female with the above-stated past psychiatric history who was admitted under involuntary commitment after attempted suicide by suffocation and CO poisoning.  She will be admitted to the psychiatric hospital.  She has been on fluoxetine 40 mg p.o. daily.  She does not think that was effective at 40 mg, and with her age and comorbidities I think be better to try and switch medications.  I will place her on Celexa 20 mg p.o. daily.  Her outside psychiatrist has her on 10 mg of Valium twice a day.  We will continue that for now.  She had a significant COPD, and we will continue her inhalers.  She also has hypertension and the metoprolol will be continued.  She takes Aleve for her arthritis at home, and I am in a put her on Naprosyn twice daily.  Her laboratories were reviewed and are essentially normal.  Her urinalysis does show nitrate positive.  There are no bacteria present, and there were  greater than 50 red blood cells, 0-5 white blood cells and squamous epithelial cells.  She may have a kidney stone.  Her drug screen was positive for benzodiazepines, but she is on Valium.  Hopefully we can be of benefit to her.  I certify that inpatient services furnished can reasonably be expected to improve the patient's condition.   Antonieta PertGreg Lawson Agape Hardiman, MD 09/13/2017, 3:57 PM
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
## 2017-09-13 NOTE — ED Triage Notes (Signed)
Pt is not cooperative with staff during triage, not willing to answer questions, dress in to scrubs or have her blood drawn. She denies SI, when reviewing her history, "what ever the fuck it says, I am not agreeing to it, I am sick and tired of all of this". Per [employer], at this time, IVC papers are en route to the ED.

## 2017-09-14 LAB — HEMOGLOBIN A1C
HEMOGLOBIN A1C: 5 % (ref 4.8–5.6)
MEAN PLASMA GLUCOSE: 96.8 mg/dL

## 2017-09-14 LAB — LIPID PANEL
Cholesterol: 201 mg/dL — ABNORMAL HIGH (ref 0–200)
HDL: 53 mg/dL
LDL Cholesterol: 123 mg/dL — ABNORMAL HIGH (ref 0–99)
Total CHOL/HDL Ratio: 3.8 ratio
Triglycerides: 123 mg/dL
VLDL: 25 mg/dL (ref 0–40)

## 2017-09-14 LAB — TSH: TSH: 2.711 u[IU]/mL (ref 0.350–4.500)

## 2017-09-14 MED ORDER — DIAZEPAM 5 MG PO TABS
10.0000 mg | ORAL_TABLET | Freq: Two times a day (BID) | ORAL | Status: DC | PRN
  Administered 2017-09-14: 10 mg via ORAL
  Filled 2017-09-14: qty 2

## 2017-09-14 MED ORDER — TRAZODONE HCL 100 MG PO TABS
100.0000 mg | ORAL_TABLET | Freq: Every evening | ORAL | Status: DC | PRN
  Administered 2017-09-14: 100 mg via ORAL
  Filled 2017-09-14: qty 1

## 2017-09-14 NOTE — Tx Team (Signed)
Interdisciplinary Treatment and Diagnostic Plan Update  09/14/2017 Time of Session: [place] MRN: [id number]  Principal Diagnosis: MDD, recurrent, severe  Secondary Diagnoses: Active Problems:   MDD (major depressive disorder), recurrent episode, severe (HCC)   Current Medications:  Current Facility-Administered Medications  Medication Dose Route Frequency Provider Last Rate Last Dose  . acetaminophen (TYLENOL) tablet 650 mg  650 mg Oral Q6H PRN Nanci Pina, FNP   650 mg at 09/13/17 2233  . albuterol (PROVENTIL HFA;VENTOLIN HFA) 108 (90 Base) MCG/ACT inhaler 2 puff  2 puff Inhalation Q6H PRN Sharma Covert, MD      . albuterol (PROVENTIL) (2.5 MG/3ML) 0.083% nebulizer solution 2.5 mg  2.5 mg Nebulization Q6H PRN Sharma Covert, MD      . alum & mag hydroxide-simeth (MAALOX/MYLANTA) 200-200-20 MG/5ML suspension 30 mL  30 mL Oral Q4H PRN Nanci Pina, FNP      . citalopram (CELEXA) tablet 20 mg  20 mg Oral Daily Sharma Covert, MD   20 mg at 09/14/17 0817  . diazepam (VALIUM) tablet 10 mg  10 mg Oral BID PRN Sharma Covert, MD      . fluticasone furoate-vilanterol (BREO ELLIPTA) 100-25 MCG/INH 1 puff  1 puff Inhalation Daily Sharma Covert, MD      . magnesium hydroxide (MILK OF MAGNESIA) suspension 30 mL  30 mL Oral Daily PRN Nanci Pina, FNP      . metoprolol succinate (TOPROL-XL) 24 hr tablet 50 mg  50 mg Oral Daily Sharma Covert, MD   50 mg at 09/14/17 0817  . naproxen (NAPROSYN) tablet 375 mg  375 mg Oral BID WC Sharma Covert, MD   375 mg at 09/14/17 0817  . nicotine (NICODERM CQ - dosed in mg/24 hours) patch 14 mg  14 mg Transdermal Daily Sharma Covert, MD   14 mg at 09/14/17 0818  . traZODone (DESYREL) tablet 50 mg  50 mg Oral QHS PRN Nanci Pina, FNP   50 mg at 09/13/17 2233   PTA Medications: Medications Prior to Admission  Medication Sig Dispense Refill Last Dose  . albuterol (PROVENTIL HFA;VENTOLIN HFA) 108 (90  BASE) MCG/ACT inhaler Inhale 1 puff into the lungs every 6 (six) hours as needed for wheezing or shortness of breath. 6.7 g 11 08/19/2017 at Unknown time  . beclomethasone (QVAR REDIHALER) 80 MCG/ACT inhaler Inhale 1 puff into the lungs 2 (two) times daily. 10.6 g 3 09/12/2017  . cephALEXin (KEFLEX) 500 MG capsule Take 1 capsule (500 mg total) by mouth 3 (three) times daily. (Patient not taking: Reported on 09/13/2017) 21 capsule 0 Completed Course at Unknown time  . diazepam (VALIUM) 10 MG tablet Take 1 tablet (10 mg total) by mouth every 6 (six) hours as needed for anxiety. 30 tablet 5 09/12/2017  . FLUoxetine (PROZAC) 10 MG tablet Take 10 mg by mouth daily.  99 09/12/2017  . metoprolol tartrate (LOPRESSOR) 50 MG tablet Take 1 tablet (50 mg total) by mouth daily. Office visit needed for additional refills 90 tablet 0 09/12/2017  . naproxen sodium (ALEVE) 220 MG tablet Take 220 mg by mouth daily as needed (pain).    08/19/2017 at Unknown time  . oxybutynin (DITROPAN) 5 MG tablet Take 0.5 tablets (2.5 mg total) by mouth 3 (three) times daily. (Patient not taking: Reported on 09/13/2017) 90 tablet 0 Not Taking at Unknown time    Patient Stressors:    Patient Strengths:    Treatment  Modalities: Medication Management, Group therapy, Case management,  1 to 1 session with clinician, Psychoeducation, Recreational therapy.   Physician Treatment Plan for Primary Diagnosis: MDD, recurrent, severe Long Term Goal(s): Improvement in symptoms so as ready for discharge Improvement in symptoms so as ready for discharge   Short Term Goals: Ability to identify changes in lifestyle to reduce recurrence of condition will improve Ability to verbalize feelings will improve Ability to disclose and discuss suicidal ideas Ability to demonstrate self-control will improve Ability to identify and develop effective coping behaviors will improve Ability to maintain clinical measurements within normal limits will  improve Ability to identify changes in lifestyle to reduce recurrence of condition will improve Ability to verbalize feelings will improve Ability to disclose and discuss suicidal ideas Ability to demonstrate self-control will improve Ability to identify and develop effective coping behaviors will improve Ability to maintain clinical measurements within normal limits will improve  Medication Management: Evaluate patient's response, side effects, and tolerance of medication regimen.  Therapeutic Interventions: 1 to 1 sessions, Unit Group sessions and Medication administration.  Evaluation of Outcomes:: Not Met  Physician Treatment Plan for Secondary Diagnosis: Active Problems:   MDD (major depressive disorder), recurrent episode, severe ([place])  Long Term Goal(s): Improvement in symptoms so as ready for discharge Improvement in symptoms so as ready for discharge   Short Term Goals: Ability to identify changes in lifestyle to reduce recurrence of condition will improve Ability to verbalize feelings will improve Ability to disclose and discuss suicidal ideas Ability to demonstrate self-control will improve Ability to identify and develop effective coping behaviors will improve Ability to maintain clinical measurements within normal limits will improve Ability to identify changes in lifestyle to reduce recurrence of condition will improve Ability to verbalize feelings will improve Ability to disclose and discuss suicidal ideas Ability to demonstrate self-control will improve Ability to identify and develop effective coping behaviors will improve Ability to maintain clinical measurements within normal limits will improve     Medication Management: Evaluate patient's response, side effects, and tolerance of medication regimen.  Therapeutic Interventions: 1 to 1 sessions, Unit Group sessions and Medication administration.  Evaluation of Outcomes: Not Met   RN Treatment Plan for Primary  Diagnosis: MDD, recurrent, severe Long Term Goal(s): Knowledge of disease and therapeutic regimen to maintain health will improve  Short Term Goals: Ability to remain free from injury will improve, Ability to disclose and discuss suicidal ideas and Ability to identify and develop effective coping behaviors will improve  Medication Management: RN will administer medications as ordered by provider, will assess and evaluate patient's response and provide education to patient for prescribed medication. RN will report any adverse and/or side effects to prescribing provider.  Therapeutic Interventions: 1 on 1 counseling sessions, Psychoeducation, Medication administration, Evaluate responses to treatment, Monitor vital signs and CBGs as ordered, Perform/monitor CIWA, COWS, AIMS and Fall Risk screenings as ordered, Perform wound care treatments as ordered.  Evaluation of Outcomes: Not Met   LCSW Treatment Plan for Primary Diagnosis: MDD, recurrent, severe Long Term Goal(s): Safe transition to appropriate next level of care at discharge, Engage patient in therapeutic group addressing interpersonal concerns.  Short Term Goals: Engage patient in aftercare planning with referrals and resources, Facilitate acceptance of mental health diagnosis and concerns and Identify triggers associated with mental health/substance abuse issues  Therapeutic Interventions: Assess for all discharge needs, 1 to 1 time with Social worker, Explore available resources and support systems, Assess for adequacy in community support network, Educate family  and significant other(s) on suicide prevention, Complete Psychosocial Assessment, Interpersonal group therapy.  Evaluation of Outcomes: Not Met   Progress in Treatment: Attending groups: No. Participating in groups: No. New to unit. Continuing to assess.  Taking medication as prescribed: Yes. Toleration medication: Yes. Family/Significant other contact made: No, will  contact:  family member if pt consents to collateral contact.  Patient understands diagnosis: Yes. Discussing patient identified problems/goals with staff: Yes. Medical problems stabilized or resolved: Yes. Denies suicidal/homicidal ideation: No. Issues/concerns per patient self-inventory: No. Other: n/a   New problem(s) identified: No, Describe:  n/a  New Short Term/Long Term Goal(s): medication management for mood stabilization; elimination of SI thoughts; development of comprehensive mental wellness/sobriety plan.   Patient Goals:  Pt did not identify goal.   Discharge Plan or Barriers: CSW assessing for appropriate referrals. [place] pamphlet, Mobile Crisis information provided to patient for additional community support and resources.   Reason for Continuation of Hospitalization: Anxiety Depression Medication stabilization Suicidal ideation  Estimated Length of Stay: Monday, 09/19/17  Attendees: Patient: 09/14/2017 10:33 AM  Physician: Dr, Mallie Darting MD 09/14/2017 10:33 AM  Nursing: Chrys Racer RN 09/14/2017 10:33 AM  RN Care Manager:x 09/14/2017 10:33 AM  Social Worker: Janice Norrie LCSW 09/14/2017 10:33 AM  Recreational Therapist: x 09/14/2017 10:33 AM  Other: Lindell Spar NP; Cecille Rubin NP 09/14/2017 10:33 AM  Other:  09/14/2017 10:33 AM  Other: 09/14/2017 10:33 AM    Scribe for Treatment Team: Avelina Laine, LCSW 09/14/2017 10:33 AM

## 2017-09-14 NOTE — Progress Notes (Signed)
Pt presents with a flat affect and depressed mood. Pt rated on her self inventory sheet: depression 1/10, anxiety 1/10 and hopelessness 1/10. Pt denies SI/HI. Pt reported difficulty staying asleep last night. Pt appears to have no insight for treatment. Pt repeatedly states, "I have to get out of here". Pt wrote on her self inventory sheet "participation, beg, plead and sign a discharge plan. I promise I will be good". Pt verbalized taking Valium two times a day as needed at home. Pt refused scheduled dose of Valium this am. Writer asked MD if Valium order could be modified to prn two times a day. Per MD writer may modify order. Order modified to prn twice a day.  Orders reviewed with pt. Verbal support provided. Pt encouraged to attend groups. 15 minute checks performed for safety.   Pt compliant with tx plan.

## 2017-09-14 NOTE — Plan of Care (Signed)
  Problem: Activity: Goal: Sleeping patterns will improve Outcome: Progressing-She was able to sleep better today but was unable to sleep during the night.  Encouraged her to stay awake tomorrow to get her sleep pattern back on track.

## 2017-09-14 NOTE — Progress Notes (Signed)
D   Pt is pleasant on approach and cooperative     She interacts well with others   Pt reports she will be discharged tomorrow and is looking forward to same   She denies suicidal and homicidal ideation and said she has learned a lot about dealing with stress since she has been here  A    Verbal support given    Medications administered and effectiveness monitored   Q 15 min checks R    Pt is safe at this time

## 2017-09-14 NOTE — Progress Notes (Signed)
Adult Psychoeducational Group Note  Date:  09/14/2017 Time:  9:36 PM  Group Topic/Focus:  Wrap-Up Group:   The focus of this group is to help patients review their daily goal of treatment and discuss progress on daily workbooks.  Participation Level:  Active  Participation Quality:  Appropriate  Affect:  Appropriate  Cognitive:  Alert and Oriented  Insight: Appropriate  Engagement in Group:  Developing/Improving  Modes of Intervention:  Clarification, Exploration and Support  Additional Comments:  Pt verbalized that she had a good day. Pt verbalized that she is being d/c tomorrow.  Pt verbalized that she learned a lot about peer support. Pt verbalized that she rated her day a 9. Pt verbalized that she learned that you just have to roll with it and that you cant control things.   Abygale Karpf, Randal Bubaerri Lee 09/14/2017, 9:36 PM

## 2017-09-14 NOTE — Plan of Care (Signed)
  Problem: Coping: Goal: Ability to verbalize frustrations and anger appropriately will improve 09/14/2017 1125 by Layla BarterWhite, Kylar Leonhardt L, RN Outcome: Progressing 09/14/2017 1125 by Layla BarterWhite, Zyair Russi L, RN Outcome: Progressing Goal: Ability to demonstrate self-control will improve 09/14/2017 1125 by Layla BarterWhite, Yandell Mcjunkins L, RN Outcome: Progressing 09/14/2017 1125 by Layla BarterWhite, Lorraine Terriquez L, RN Outcome: Progressing

## 2017-09-14 NOTE — Progress Notes (Signed)
D:  Jasmin Munoz was in her room much of the evening sleeping.  She did get up to request tylenol and trazodone.  She denied SI/HI or A/V hallucinations.  She stated that she hasn't been sleeping so it felt good to get some sleep.  She was awake around 215 AM stating she couldn't sleep any more.  No other prn's available to give.  She stated that she would be fine and encouraged her to try to stay awake all day tomorrow so she will be able to get her days/nights back on schedule.  She appears to be in no physical distress.  She is currently reading quietly in her room. A:  1:1 with RN for support and encouragement.  Medications as ordered.  Q 15 minute checks maintained for safety.  Encouraged participation in group and unit activities.   R:  Jasmin Munoz remains safe on the unit.  We will continue to monitor the progress towards her goals.

## 2017-09-14 NOTE — Progress Notes (Signed)
[facility] MD Progress Note  09/14/2017 2:02 PM Jasmin Munoz  MRN:  [id number] Subjective: Patient is seen and examined.  Patient is a 67 year old female with a past psychiatric history significant for major depression as well as generalized anxiety disorder.  She is seen in follow-up.  She really does not discuss anything to do with her depression today.  She states she feels very well, and would like to be able to be discharged.  She stated that she is found to house that she is going to move to and she is very excited about that.  Her main concern is being able to get out of the hospital.  She denied any suicidal ideation today. Principal Problem: <principal problem not specified> Diagnosis:   Patient Active Problem List   Diagnosis Date Noted  . MDD (major depressive disorder), recurrent episode, severe (HCC) [F33.2] 09/13/2017  . COPD (chronic obstructive pulmonary disease) (HCC) [J44.9] 02/03/2013  . COPD exacerbation (HCC) [J44.1] 02/03/2013  . Hypoxemia [R09.02] 02/03/2013  . Generalized anxiety disorder [F41.1] 02/03/2013  . HTN (hypertension) [I10] 02/03/2013  . Nonspecific abnormal electrocardiogram (ECG) (EKG) [R94.31] 02/03/2013  . DEPRESSION [F32.9] 05/24/2006  . TENDINITIS [M77.9] 05/24/2006   Total Time spent with patient: 15 minutes  Past Psychiatric History: See admission H&P  Past Medical History:  Past Medical History:  Diagnosis Date  . Anxiety   . Arthritis   . Cataract   . COPD (chronic obstructive pulmonary disease) (HCC)   . Depression   . Fibromyalgia   . Macular degeneration   . Seizures (HCC)     Past Surgical History:  Procedure Laterality Date  . ABDOMINAL HYSTERECTOMY     Family History:  Family History  Problem Relation Age of Onset  . Emphysema Mother   . Arthritis Father   . Arthritis Sister   . Joint hypermobility Brother    Family Psychiatric  History: See admission H&P Social History:  Social History   Substance and Sexual Activity   Alcohol Use No     Social History   Substance and Sexual Activity  Drug Use No    Social History   Socioeconomic History  . Marital status: Married    Spouse name: Not on file  . Number of children: Not on file  . Years of education: Not on file  . Highest education level: Not on file  Occupational History  . Not on file  Social Needs  . Financial resource strain: Not on file  . Food insecurity:    Worry: Not on file    Inability: Not on file  . Transportation needs:    Medical: Not on file    Non-medical: Not on file  Tobacco Use  . Smoking status: Current Every Day Smoker    Packs/day: 1.00    Years: 35.00    Pack years: 35.00    Types: Cigarettes    Last attempt to quit: 01/27/2013    Years since quitting: 4.6  . Smokeless tobacco: Current User  Substance and Sexual Activity  . Alcohol use: No  . Drug use: No  . Sexual activity: Not Currently  Lifestyle  . Physical activity:    Days per week: Not on file    Minutes per session: Not on file  . Stress: Not on file  Relationships  . Social connections:    Talks on phone: Not on file    Gets together: Not on file    Attends religious service: Not on file  Active member of club or organization: Not on file    Attends meetings of clubs or organizations: Not on file    Relationship status: Not on file  Other Topics Concern  . Not on file  Social History Narrative  . Not on file   Additional Social History:                         Sleep: Fair  Appetite:  Fair  Current Medications: Current Facility-Administered Medications  Medication Dose Route Frequency Provider Last Rate Last Dose  . acetaminophen (TYLENOL) tablet 650 mg  650 mg Oral Q6H PRN Truman HaywardStarkes, Takia S, FNP   650 mg at 09/13/17 2233  . albuterol (PROVENTIL HFA;VENTOLIN HFA) 108 (90 Base) MCG/ACT inhaler 2 puff  2 puff Inhalation Q6H PRN Antonieta Pertlary, Kelden Lavallee Lawson, MD      . albuterol (PROVENTIL) (2.5 MG/3ML) 0.083% nebulizer solution 2.5 mg   2.5 mg Nebulization Q6H PRN Antonieta Pertlary, Sela Falk Lawson, MD      . alum & mag hydroxide-simeth (MAALOX/MYLANTA) 200-200-20 MG/5ML suspension 30 mL  30 mL Oral Q4H PRN Truman HaywardStarkes, Takia S, FNP      . citalopram (CELEXA) tablet 20 mg  20 mg Oral Daily Antonieta Pertlary, Dimitrios Balestrieri Lawson, MD   20 mg at 09/14/17 0817  . diazepam (VALIUM) tablet 10 mg  10 mg Oral BID PRN Antonieta Pertlary, Lyndall Windt Lawson, MD      . fluticasone furoate-vilanterol (BREO ELLIPTA) 100-25 MCG/INH 1 puff  1 puff Inhalation Daily Antonieta Pertlary, Temika Sutphin Lawson, MD      . magnesium hydroxide (MILK OF MAGNESIA) suspension 30 mL  30 mL Oral Daily PRN Truman HaywardStarkes, Takia S, FNP      . metoprolol succinate (TOPROL-XL) 24 hr tablet 50 mg  50 mg Oral Daily Antonieta Pertlary, Mung Rinker Lawson, MD   50 mg at 09/14/17 0817  . naproxen (NAPROSYN) tablet 375 mg  375 mg Oral BID WC Antonieta Pertlary, Joandry Slagter Lawson, MD   375 mg at 09/14/17 0817  . nicotine (NICODERM CQ - dosed in mg/24 hours) patch 14 mg  14 mg Transdermal Daily Antonieta Pertlary, Maleka Contino Lawson, MD   14 mg at 09/14/17 0818  . traZODone (DESYREL) tablet 50 mg  50 mg Oral QHS PRN Truman HaywardStarkes, Takia S, FNP   50 mg at 09/13/17 2233    Lab Results:  Results for orders placed or performed during the hospital encounter of 09/13/17 (from the past 48 hour(s))  Hemoglobin A1c     Status: None   Collection Time: 09/14/17  6:29 AM  Result Value Ref Range   Hgb A1c MFr Bld 5.0 4.8 - 5.6 %    Comment: (NOTE) Pre diabetes:          5.7%-6.4% Diabetes:              >6.4% Glycemic control for   <7.0% adults with diabetes    Mean Plasma Glucose 96.8 mg/dL    Comment: Performed at [facility], [address] [place], [place] [address]  Lipid panel     Status: Abnormal   Collection Time: 09/14/17  6:29 AM  Result Value Ref Range   Cholesterol 201 (H) 0 - 200 mg/dL   Triglycerides 981123 <191<150 mg/dL   HDL 53 >47>40 mg/dL   Total CHOL/HDL Ratio 3.8 RATIO   VLDL 25 0 - 40 mg/dL   LDL Cholesterol 829123 (H) 0 - 99 mg/dL    Comment:        Total Cholesterol/HDL:CHD Risk Coronary  Heart  Disease Risk Table                     Men   Women  1/2 Average Risk   3.4   3.3  Average Risk       5.0   4.4  2 X Average Risk   9.6   7.1  3 X Average Risk  23.4   11.0        Use the calculated Patient Ratio above and the CHD Risk Table to determine the patient's CHD Risk.        ATP III CLASSIFICATION (LDL):  <100     mg/dL   Optimal  865-784  mg/dL   Near or Above                    Optimal  130-159  mg/dL   Borderline  696-295  mg/dL   High  >284     mg/dL   Very High Performed at [facility], [address], [place], [place] [address]   TSH     Status: None   Collection Time: 09/14/17  6:29 AM  Result Value Ref Range   TSH 2.711 0.350 - 4.500 uIU/mL    Comment: Performed by a 3rd Generation assay with a functional sensitivity of <=0.01 uIU/mL. Performed at [facility], [address] [place], [place] [address]     Blood Alcohol level:  Lab Results  Component Value Date   ETH <10 09/13/2017    Metabolic Disorder Labs: Lab Results  Component Value Date   HGBA1C 5.0 09/14/2017   MPG 96.8 09/14/2017   No results found for: PROLACTIN Lab Results  Component Value Date   CHOL 201 (H) 09/14/2017   TRIG 123 09/14/2017   HDL 53 09/14/2017   CHOLHDL 3.8 09/14/2017   VLDL 25 09/14/2017   LDLCALC 123 (H) 09/14/2017   LDLCALC 102 (H) 07/05/2016    Physical Findings: AIMS: Facial and Oral Movements Muscles of Facial Expression: None, normal Lips and Perioral Area: None, normal Jaw: None, normal Tongue: None, normal,Extremity Movements Upper (arms, wrists, hands, fingers): None, normal Lower (legs, knees, ankles, toes): None, normal, Trunk Movements Neck, shoulders, hips: None, normal, Overall Severity Severity of abnormal movements (highest score from questions above): None, normal Incapacitation due to abnormal movements: None, normal Patient's awareness of abnormal movements (rate only patient's report): No Awareness,  Dental Status Current problems with teeth and/or dentures?: No Does patient usually wear dentures?: No  CIWA:    COWS:     Musculoskeletal: Strength & Muscle Tone: within normal limits Gait & Station: normal Patient leans: N/A  Psychiatric Specialty Exam: Physical Exam  ROS  Blood pressure 100/67, pulse 64, temperature 98 F (36.7 C), temperature source Oral, resp. rate 16, height 5\' 2"  (1.575 m), weight 45.4 kg, SpO2 100 %.Body mass index is 18.29 kg/m.  General Appearance: Casual  Eye Contact:  Good  Speech:  Normal Rate  Volume:  Normal  Mood:  Euthymic  Affect:  Congruent  Thought Process:  Coherent  Orientation:  Full (Time, Place, and Person)  Thought Content:  Logical  Suicidal Thoughts:  No  Homicidal Thoughts:  No  Memory:  Immediate;   Fair Recent;   Fair Remote;   Fair  Judgement:  Intact  Insight:  Lacking  Psychomotor Activity:  Normal  Concentration:  Concentration: Fair and Attention Span: Fair  Recall:  [employer] of Knowledge:  Fair  Language:  Fair  Akathisia:  Negative  Handed:  Right  AIMS (if indicated):     Assets:  Communication Skills Desire for Improvement Financial Resources/Insurance Housing Physical Health Resilience  ADL's:  Intact  Cognition:  WNL  Sleep:  Number of Hours: 1.75     Treatment Plan Summary: Daily contact with patient to assess and evaluate symptoms and progress in treatment, Medication management and Plan : Patient is seen and examined.  Patient is a 67 year old female with the above-stated past psychiatric history seen in follow-up.  She denied any depressive symptoms today.  She denied any suicidal ideation.  She has put her suicide attempt in "the rearview mirror".  She is very excited about this house that she may be purchasing.  She stated her sleep was not good last night, and I will increase her trazodone 200 mg p.o. nightly as needed.  No other changes in her medications.  Antonieta PertGreg Lawson Lonny Eisen, MD 09/14/2017,  2:02 PM

## 2017-09-15 ENCOUNTER — Encounter (HOSPITAL_COMMUNITY): Payer: Self-pay | Admitting: Behavioral Health

## 2017-09-15 MED ORDER — TRAZODONE HCL 100 MG PO TABS: 100.0000 mg | ORAL_TABLET | Freq: Every evening | ORAL | 0 refills | Status: DC | PRN

## 2017-09-15 MED ORDER — CITALOPRAM HYDROBROMIDE 20 MG PO TABS: 20.0000 mg | ORAL_TABLET | Freq: Every day | ORAL | 0 refills | Status: DC

## 2017-09-15 MED ORDER — METOPROLOL SUCCINATE ER 50 MG PO TB24: 50.0000 mg | ORAL_TABLET | Freq: Every day | ORAL | 0 refills | Status: DC

## 2017-09-15 NOTE — [facility] Suicide Risk Assessment (Signed)
[facility] INPATIENT:  Family/Significant Other Suicide Prevention Education  Suicide Prevention Education:  Patient Refusal for Family/Significant Other Suicide Prevention Education: The patient Jasmin Munoz has refused to provide written consent for family/significant other to be provided Family/Significant Other Suicide Prevention Education during admission and/or prior to discharge.  Physician notified.  [name], Cheyene Hamric Jon, LCSW 09/15/2017, 10:12 AM

## 2017-09-15 NOTE — [facility] Suicide Risk Assessment (Signed)
[facility]   Principal Problem: <principal problem not specified> Discharge Diagnoses:  Patient Active Problem List   Diagnosis Date Noted  . MDD (major depressive disorder), recurrent episode, severe (HCC) [F33.2] 09/13/2017  . COPD (chronic obstructive pulmonary disease) (HCC) [J44.9] 02/03/2013  . COPD exacerbation (HCC) [J44.1] 02/03/2013  . Hypoxemia [R09.02] 02/03/2013  . Generalized anxiety disorder [F41.1] 02/03/2013  . HTN (hypertension) [I10] 02/03/2013  . Nonspecific abnormal electrocardiogram (ECG) (EKG) [R94.31] 02/03/2013  . DEPRESSION [F32.9] 05/24/2006  . TENDINITIS [M77.9] 05/24/2006    Total Time spent with patient: 15 minutes  Musculoskeletal: Strength & Muscle Tone: within normal limits Gait & Station: normal Patient leans: N/A  Psychiatric Specialty Exam: Review of Systems  All other systems reviewed and are negative.   Blood pressure (!) 86/68, pulse 76, temperature 97.7 F (36.5 C), temperature source Oral, resp. rate 18, height 5\' 2"  (1.575 m), weight 45.4 kg, SpO2 100 %.Body mass index is 18.29 kg/m.  General Appearance: Casual  Eye Contact::  Fair  Speech:  Normal Rate409  Volume:  Normal  Mood:  Euthymic  Affect:  Congruent  Thought Process:  Coherent  Orientation:  Full (Time, Place, and Person)  Thought Content:  Logical  Suicidal Thoughts:  No  Homicidal Thoughts:  No  Memory:  Immediate;   Fair Recent;   Fair Remote;   Fair  Judgement:  Intact  Insight:  Lacking  Psychomotor Activity:  Normal  Concentration:  Fair  Recall:  [employer]Fair  Fund of Knowledge:Fair  Language: Good  Akathisia:  Negative  Handed:  Right  AIMS (if indicated):     Assets:  Communication Skills Desire for Improvement Financial Resources/Insurance Housing Physical Health Resilience Talents/Skills  Sleep:  Number of Hours: 6.75  Cognition: WNL  ADL's:  Intact   Mental Status Per Nursing Assessment::   On Admission:   NA  Demographic Factors:  Age 67 or older, Caucasian and Unemployed  Loss Factors: NA  Historical Factors: Impulsivity  Risk Reduction Factors:   Responsible for children under 67 years of age and Sense of responsibility to family  Continued Clinical Symptoms:  Depression:   Impulsivity  Cognitive Features That Contribute To Risk:  None    Suicide Risk:  Minimal: No identifiable suicidal ideation.  Patients presenting with no risk factors but with morbid ruminations; may be classified as minimal risk based on the severity of the depressive symptoms    Plan Of Care/Follow-up recommendations:  Activity:  ad lib  Antonieta PertGreg Lawson Arsen Mangione, MD 09/15/2017, 7:47 AM

## 2017-09-15 NOTE — Progress Notes (Signed)
  [facility] Discharge [facility] :  Will you be returning to the same living situation after discharge:  Yes,  own home At discharge, do you have transportation home?: Yes,  daughter Do you have the ability to pay for your medications: Yes,  medicare  Release of information consent forms completed and in the chart;  Patient's signature needed at discharge.  Patient to Follow up at: Follow-up Information    Dr. Len Blalockavid Fuller. Go on 09/20/2017.   Why:  Please attend your appt with Dr. Toni ArthursFuller on Tuesday, 09/20/17, at 2:30pm. Contact information: [address] [place], [place] [address] P: [phone] F: [phone]) [phone]          Next level of care provider has access to [facility] Link:no  Safety Planning and Suicide Prevention discussed: No. Pt declined consent.  SPE completed with pt.  Have you used any form of tobacco in the last 30 days? (Cigarettes, Smokeless Tobacco, Cigars, and/or Pipes): Yes  Has patient been referred to the Quitline?: Patient refused referral  Patient has been referred for addiction treatment: N/A  [name], Dameer Speiser Jon, LCSW 09/15/2017, 10:43 AM

## 2017-09-15 NOTE — [facility] Counselor (Signed)
Adult Comprehensive Assessment  Patient ID: Jasmin Munoz, female   DOB: 08/05/1950, 67 y.o.   MRN: [id number]  Information Source: Information source: Patient  Current Stressors:  Patient states their primary concNelva Nayerns and needs for treatment are:: "overwhelmed" Patient states their goals for this hospitilization and ongoing recovery are:: no goal Family Relationships: Husband has alzheimers disease, not doing well.  Pt has conflictual relationship with daughter.  Bereavement / Loss: Anticipating death of husband.  Living/Environment/Situation:  Living Arrangements: Alone Living conditions (as described by patient or guardian): House is too big.  Looking to move into a condo Who else lives in the home?: none How long has patient lived in current situation?: 35 years What is atmosphere in current home: Comfortable  Family History:  Marital status: Married Number of Years Married: 46 What types of issues is patient dealing with in the relationship?: husband completely debliitated by alzheimers disease Are you sexually active?: No What is your sexual orientation?: heterosexual Has your sexual activity been affected by drugs, alcohol, medication, or emotional stress?: na Does patient have children?: Yes How many children?: 1 How is patient's relationship with their children?: daughter: Not a good relationship, she's rude.    Childhood History:  By whom was/is the patient raised?: Both parents Additional childhood history information: Parents remained married.  Wonderful childhood. We were a close family. Description of patient's relationship with caregiver when they were a child: mom: excellent, dad: very good Patient's description of current relationship with people who raised him/her: both deceased How were you disciplined when you got in trouble as a child/adolescent?: appropriate discipline Does patient have siblings?: Yes Number of Siblings: 3 Description of patient's current  relationship with siblings: 2 brothers, 1 sister.   Good relationships with all except sister. Did patient suffer any verbal/emotional/physical/sexual abuse as a child?: No Did patient suffer from severe childhood neglect?: No Has patient ever been sexually abused/assaulted/raped as an adolescent or adult?: No Was the patient ever a victim of a crime or a disaster?: No Witnessed domestic violence?: No Has patient been effected by domestic violence as an adult?: No  Education:  Highest grade of school patient has completed: Catering managerBS Science [employer] Currently a Consulting civil engineerstudent?: No Learning disability?: No  Employment/Work Situation:   Employment situation: Retired Psychologist, clinicalatient's job has been impacted by current illness: (na) What is the longest time patient has a held a job?: 6 years Where was the patient employed at that time?: [employer] [place] Did You Receive Any Psychiatric Treatment/Services While in the [employer]?: No Are There Guns or Other Weapons in Your Home?: No  Financial Resources:   Surveyor, quantityinancial resources: (retirement income, social security 2 pensions) Does patient have a Lawyerrepresentative payee or guardian?: No  Alcohol/Substance Abuse:   What has been your use of drugs/alcohol within the last 12 months?: alcohol: none, drugs: none If attempted suicide, did drugs/alcohol play a role in this?: No Alcohol/Substance Abuse Treatment Hx: Denies past history Has alcohol/substance abuse ever caused legal problems?: No  Social Support System:   Conservation officer, natureatient's Community Support System: Fair Museum/gallery exhibitions officerDescribe Community Support System: daughter somewhat, grandson, older brother Type of faith/religion: none How does patient's faith help to cope with current illness?: na  Leisure/Recreation:   Leisure and Hobbies: reading, singing  Strengths/Needs:   What is the patient's perception of their strengths?: pt likes to be active Patient states they can use these personal strengths during their treatment to  contribute to their recovery: Pt would like to get involved  in more activities Patient states these barriers may affect/interfere with their treatment: none Patient states these barriers may affect their return to the community: none Other important information patient would like considered in planning for their treatment: none  Discharge Plan:   Currently receiving community mental health services: Yes (From Whom)(Dr Toni ArthursFuller, meds) Patient states concerns and preferences for aftercare planning are: pt wants to ccontinue with Dr Toni ArthursFuller.  Does not want to be involved with therapy. Patient states they will know when they are safe and ready for discharge when: Pt wants to continue to be involved with her grandson Does patient have access to transportation?: Yes Does patient have financial barriers related to discharge medications?: No Will patient be returning to same living situation after discharge?: Yes  Summary/Recommendations:   Summary and Recommendations (to be completed by the evaluator): Pt is 67 year old female from [place].  Pt is diagnosed with Majore Depressive disorder and was admitted after a sucide attempt.  Recommendations for pt include crisis stabilization, theapeutic milieu, attend and participate in groups, medication managmem, and development of comprehensive mental wellness plan.  Jasmin Munoz, Jasmin Munoz Jon. 09/15/2017

## 2017-09-15 NOTE — Discharge Summary (Signed)
Physician Discharge Summary Note  Patient:  Jasmin Munoz is an 67 y.o., female MRN:  [id number] DOB:  12-26-1950 Patient phone:  [phone] (home)  Patient address:   [address] [place] [address],  Total Time spent with patient: 30 minutes  Date of Admission:  09/13/2017 Date of Discharge: 09/15/2017  Reason for Admission:  Suicide attempt.   Principal Problem: MDD (major depressive disorder), recurrent episode, severe [facility]) Discharge Diagnoses: Patient Active Problem List   Diagnosis Date Noted  . MDD (major depressive disorder), recurrent episode, severe (HCC) [F33.2] 09/13/2017  . COPD (chronic obstructive pulmonary disease) (HCC) [J44.9] 02/03/2013  . COPD exacerbation (HCC) [J44.1] 02/03/2013  . Hypoxemia [R09.02] 02/03/2013  . Generalized anxiety disorder [F41.1] 02/03/2013  . HTN (hypertension) [I10] 02/03/2013  . Nonspecific abnormal electrocardiogram (ECG) (EKG) [R94.31] 02/03/2013  . DEPRESSION [F32.9] 05/24/2006  . TENDINITIS [M77.9] 05/24/2006    Past Psychiatric History: Patient is had depression for many years.  She had a psychiatric hospitalization back in 2001 at our institution.  She is been previously treated with Pamelor, Paxil, fluoxetine, Valium, Klonopin.  Past Medical History:  Past Medical History:  Diagnosis Date  . Anxiety   . Arthritis   . Cataract   . COPD (chronic obstructive pulmonary disease) (HCC)   . Depression   . Fibromyalgia   . Macular degeneration   . Seizures (HCC)     Past Surgical History:  Procedure Laterality Date  . ABDOMINAL HYSTERECTOMY     Family History:  Family History  Problem Relation Age of Onset  . Emphysema Mother   . Arthritis Father   . Arthritis Sister   . Joint hypermobility Brother    Family Psychiatric  History: Denied Social History:  Social History   Substance and Sexual Activity  Alcohol Use No     Social History   Substance and Sexual Activity  Drug Use No    Social  History   Socioeconomic History  . Marital status: Married    Spouse name: Not on file  . Number of children: Not on file  . Years of education: Not on file  . Highest education level: Not on file  Occupational History  . Not on file  Social Needs  . Financial resource strain: Not on file  . Food insecurity:    Worry: Not on file    Inability: Not on file  . Transportation needs:    Medical: Not on file    Non-medical: Not on file  Tobacco Use  . Smoking status: Current Every Day Smoker    Packs/day: 1.00    Years: 35.00    Pack years: 35.00    Types: Cigarettes    Last attempt to quit: 01/27/2013    Years since quitting: 4.6  . Smokeless tobacco: Current User  Substance and Sexual Activity  . Alcohol use: No  . Drug use: No  . Sexual activity: Not Currently  Lifestyle  . Physical activity:    Days per week: Not on file    Minutes per session: Not on file  . Stress: Not on file  Relationships  . Social connections:    Talks on phone: Not on file    Gets together: Not on file    Attends religious service: Not on file    Active member of club or organization: Not on file    Attends meetings of clubs or organizations: Not on file    Relationship status: Not on file  Other Topics  Concern  . Not on file  Social History Narrative  . Not on file    Hospital Course:  Patient is seen and examined.  Patient is a 67 year old female with a past psychiatric history significant for major depression as well as generalized anxiety disorder.  The patient was placed under involuntary commitment today.  Apparently the patient had been found by [facility] police lying in the back of her car with all the windows rolled up, the car was running, and her garage door been closed.  The patient admits she was trying to kill her self.  She stated she has had multiple stressors recently and is having trouble coping with them.  She stated her husband has Alzheimer's disease and has for several  years.  He is in a memory care unit.  She also stated that she does not get along with her daughter who lives relatively close to her.  She feels like her daughter spends more time smoking marijuana and watching videos on YouTube.  She also stated that her grandson is very intelligent, and her daughter does not have any idea on how to help him.  She has been seeing Dr. Toni Arthurs in [place] for her depression.  He prescribes fluoxetine and diazepam for her.  Her last psychiatric hospitalization was in 2001 in our institution.  She has previously taken fluoxetine, diazepam, Paxil, Pamelor.  She developed a seizure after she had taken Pamelor in the past.  She has a history of hypertension, hyperlipidemia as well as COPD.  She still smokes 10 cigarettes a day.  She admits to helplessness, hopelessness and worthlessness.  Corabelle was started on medication regimen for presenting symptoms. She was medicated & discharged on; Celexa 20 mg po daily for depression, Trazodone 200 mg po daily at bedtime as needed for sleep. Metoprolol XL 50 mg po daily for elevated blood pressure. She was advised to resume home medications as noted below. Patient has been adherent with treatment recommendations. Patient tolerated the medications without any reported side effects are adverse reactions.  Patient was enrolled & participated in the group counseling sessions being offerred & held on this unit. Patient learned coping skills.  Daksha is seen today by the attending psychiatrist for discharge. Patient denies any delusions, no hallucinations or other psychotic process. Patient denies active or passive suicidal thoughts. No thoughts of violence.  Endorses overall improvement in mood emotional state.    Nursing staff reports that patient has been appropriate on the unit. Patient has been interacting well with peers. No behavioral issues. Patient has not voiced any suicidal thoughts.  Patient was discussed at the treatment team meeting  this morning. Team members feels that patient is back to her baseline level of functioning. Team agrees with plan to discharge patient today. Patient was provided with all follow-up information to resume mental health treatment following discharge as noted below. Shalissa was provided with a prescription for her [facility] discharge medications.  Patient left [facility] with all personal belongings in no apparent distress. Transportation per patient/ family arrangement.      Labs: Reviewed and noted as below. Her UDS was positive for amphetamines. Ethanol negative.   Physical Findings: AIMS: Facial and Oral Movements Muscles of Facial Expression: None, normal Lips and Perioral Area: None, normal Jaw: None, normal Tongue: None, normal,Extremity Movements Upper (arms, wrists, hands, fingers): None, normal Lower (legs, knees, ankles, toes): None, normal, Trunk Movements Neck, shoulders, hips: None, normal, Overall Severity Severity of abnormal movements (highest score from questions above):  None, normal Incapacitation due to abnormal movements: None, normal Patient's awareness of abnormal movements (rate only patient's report): No Awareness, Dental Status Current problems with teeth and/or dentures?: No Does patient usually wear dentures?: No  CIWA:    COWS:     Musculoskeletal: Strength & Muscle Tone: within normal limits Gait & Station: normal Patient leans: N/A  Psychiatric Specialty Exam: SEE SRA BY MD Physical Exam  Nursing note and vitals reviewed. Constitutional: She is oriented to person, place, and time.  Neurological: She is alert and oriented to person, place, and time.    Review of Systems  Psychiatric/Behavioral: Negative for hallucinations, memory loss, substance abuse and suicidal ideas. Depression: stable. Nervous/anxious: stable. Insomnia: stable.   All other systems reviewed and are negative.   Blood pressure 97/70, pulse 76, temperature 97.7 F (36.5 C), temperature source Oral,  resp. rate 18, height 5\' 2"  (1.575 m), weight 45.4 kg, SpO2 100 %.Body mass index is 18.29 kg/m.    Have you used any form of tobacco in the last 30 days? (Cigarettes, Smokeless Tobacco, Cigars, and/or Pipes): Yes  Has this patient used any form of tobacco in the last 30 days? (Cigarettes, Smokeless Tobacco, Cigars, and/or Pipes) Yes, Yes, A prescription for an FDA-approved tobacco cessation medication was offered at discharge and the patient refused  Blood Alcohol level:  Lab Results  Component Value Date   ETH <10 09/13/2017    Metabolic Disorder Labs:  Lab Results  Component Value Date   HGBA1C 5.0 09/14/2017   MPG 96.8 09/14/2017   No results found for: PROLACTIN Lab Results  Component Value Date   CHOL 201 (H) 09/14/2017   TRIG 123 09/14/2017   HDL 53 09/14/2017   CHOLHDL 3.8 09/14/2017   VLDL 25 09/14/2017   LDLCALC 123 (H) 09/14/2017   LDLCALC 102 (H) 07/05/2016    See Psychiatric Specialty Exam and Suicide Risk Assessment completed by Attending Physician prior to discharge.  Discharge destination:  Home  Is patient on multiple antipsychotic therapies at discharge:  No   Has Patient had three or more failed trials of antipsychotic monotherapy by history:  No  Recommended Plan for Multiple Antipsychotic Therapies: NA  Discharge Instructions    Discharge instructions   Complete by:  As directed    Discharge Recommendations:  The patient is being discharged to her family. Patient is to take her discharge medications as ordered.  See follow up above. We recommend that she participate in individual therapy to target depression, anxiety, suicidal thoughts and improving coping skills.  Patient will benefit from monitoring of recurrence suicidal ideation since patient is on antidepressant medication. The patient should abstain from all illicit substances and alcohol.  If the patient's symptoms worsen or do not continue to improve or if the patient becomes actively  suicidal or homicidal then it is recommended that the patient return to the closest hospital emergency room or call 911 for further evaluation and treatment.  National Suicide Prevention Lifeline 1800-SUICIDE or [phone]. Please follow up with your primary medical doctor for all other medical needs.  The patient has been educated on the possible side effects to medications and she/her guardian is to contact a medical professional and inform outpatient provider of any new side effects of medication. She is to take regular diet and activity as tolerated.  Patient would benefit from a daily moderate exercise. Family was educated about removing/locking any firearms, medications or dangerous products from the home.     Allergies as of 09/15/2017  Reactions   Sulfa Antibiotics Hives      Medication List    STOP taking these medications   cephALEXin 500 MG capsule Commonly known as:  KEFLEX   FLUoxetine 10 MG tablet Commonly known as:  PROZAC   metoprolol tartrate 50 MG tablet Commonly known as:  LOPRESSOR   oxybutynin 5 MG tablet Commonly known as:  DITROPAN     TAKE these medications     Indication  albuterol 108 (90 Base) MCG/ACT inhaler Commonly known as:  PROVENTIL HFA;VENTOLIN HFA Inhale 1 puff into the lungs every 6 (six) hours as needed for wheezing or shortness of breath.  Indication:  Asthma   beclomethasone 80 MCG/ACT inhaler Commonly known as:  QVAR Inhale 1 puff into the lungs 2 (two) times daily.  Indication:  Asthma   citalopram 20 MG tablet Commonly known as:  CELEXA Take 1 tablet (20 mg total) by mouth daily. Start taking on:  09/16/2017  Indication:  Depression   diazepam 10 MG tablet Commonly known as:  VALIUM Take 1 tablet (10 mg total) by mouth every 6 (six) hours as needed for anxiety.  Indication:  anxiety   metoprolol succinate 50 MG 24 hr tablet Commonly known as:  TOPROL-XL Take 1 tablet (50 mg total) by mouth daily. Take with or  immediately following a meal. Start taking on:  09/16/2017  Indication:  High Blood Pressure Disorder   naproxen sodium 220 MG tablet Commonly known as:  ALEVE Take 220 mg by mouth daily as needed (pain).  Indication:  Pain   traZODone 100 MG tablet Commonly known as:  DESYREL Take 1 tablet (100 mg total) by mouth at bedtime as needed for sleep.  Indication:  Trouble Sleeping      Follow-up Information    Dr. Len Blalockavid Fuller. Go on 09/20/2017.   Why:  Please attend your appt with Dr. Toni ArthursFuller on Tuesday, 09/20/17, at 2:30pm. Contact information: [address] [place], [place] [address] P: [phone] F: [phone]) [phone]          Follow-up recommendations:  Follow up with your outpatient provided for any medical issues. Activity & diet as recommended by your primary care provider.  Comments:  See discharge instructions above.   Signed: Denzil MagnusonLaShunda Minetta Krisher, NP 09/15/2017, 1:20 PM

## 2017-09-15 NOTE — Progress Notes (Signed)
Pt received both written and verbal discharge instructions. Pt verbalized understanding of discharge instructions. Pt agreed to f/u appt and med regimen. Pt received d/c packet and prescriptions. Pt gathered belongings from room and locker. Pt safely discharged to the lobby. 

## 2017-09-19 ENCOUNTER — Ambulatory Visit (INDEPENDENT_AMBULATORY_CARE_PROVIDER_SITE_OTHER): Payer: Medicare Other | Admitting: Physician Assistant

## 2017-09-19 ENCOUNTER — Encounter: Payer: Self-pay | Admitting: Physician Assistant

## 2017-09-19 ENCOUNTER — Other Ambulatory Visit: Payer: Self-pay

## 2017-09-19 VITALS — BP 138/96 | HR 66 | Temp 97.9°F | Ht 62.0 in | Wt 98.4 lb

## 2017-09-19 DIAGNOSIS — I10 Essential (primary) hypertension: Secondary | ICD-10-CM

## 2017-09-19 DIAGNOSIS — J449 Chronic obstructive pulmonary disease, unspecified: Secondary | ICD-10-CM

## 2017-09-19 MED ORDER — FLUTICASONE-SALMETEROL 250-50 MCG/DOSE IN AEPB: 1.0000 | INHALATION_SPRAY | Freq: Two times a day (BID) | RESPIRATORY_TRACT | 1 refills | Status: DC

## 2017-09-19 MED ORDER — METOPROLOL SUCCINATE ER 50 MG PO TB24: 50.0000 mg | ORAL_TABLET | Freq: Every day | ORAL | 3 refills | Status: DC

## 2017-09-19 MED ORDER — ALBUTEROL SULFATE HFA 108 (90 BASE) MCG/ACT IN AERS: 1.0000 | INHALATION_SPRAY | Freq: Four times a day (QID) | RESPIRATORY_TRACT | 11 refills | Status: DC | PRN

## 2017-09-19 NOTE — Progress Notes (Signed)
Jasmin Munoz  MRN: [id number] DOB: 06/28/1950  PCP: Ofilia Neaslark, Michael L, PA-C  Subjective:  Pt is a 67 year old female who presents to clinic for medication refill. This is my first time seeing this pt.   HTN - metoprolol 50mg . Blood pressure today is 138/96. She is asymptomatic today.   COPD - seen in the ED 08/2017 for COPD exacerbation. She was unaware that she was supposed to be using QVAR 2x/day. "I only use this during emergencies" which is a few times/week. She would like a different medication as QVAR was $250 (per pt) She denies wheezing, shob, chest tightness.   Review of Systems  Constitutional: Negative for diaphoresis and fatigue.  Respiratory: Negative for cough, chest tightness and shortness of breath.   Cardiovascular: Negative for chest pain and palpitations.  Neurological: Negative for dizziness, light-headedness and headaches.    Patient Active Problem List   Diagnosis Date Noted  . MDD (major depressive disorder), recurrent episode, severe (HCC) 09/13/2017  . COPD (chronic obstructive pulmonary disease) (HCC) 02/03/2013  . COPD exacerbation (HCC) 02/03/2013  . Hypoxemia 02/03/2013  . Generalized anxiety disorder 02/03/2013  . HTN (hypertension) 02/03/2013  . Nonspecific abnormal electrocardiogram (ECG) (EKG) 02/03/2013  . DEPRESSION 05/24/2006  . TENDINITIS 05/24/2006    Current Outpatient Medications on File Prior to Visit  Medication Sig Dispense Refill  . albuterol (PROVENTIL HFA;VENTOLIN HFA) 108 (90 BASE) MCG/ACT inhaler Inhale 1 puff into the lungs every 6 (six) hours as needed for wheezing or shortness of breath. 6.7 g 11  . citalopram (CELEXA) 20 MG tablet Take 1 tablet (20 mg total) by mouth daily. 30 tablet 0  . diazepam (VALIUM) 10 MG tablet Take 1 tablet (10 mg total) by mouth every 6 (six) hours as needed for anxiety. 30 tablet 5  . metoprolol succinate (TOPROL-XL) 50 MG 24 hr tablet Take 1 tablet (50 mg total) by mouth daily. Take with or  immediately following a meal. 30 tablet 0  . naproxen sodium (ALEVE) 220 MG tablet Take 220 mg by mouth daily as needed (pain).     Marland Kitchen. beclomethasone (QVAR REDIHALER) 80 MCG/ACT inhaler Inhale 1 puff into the lungs 2 (two) times daily. (Patient not taking: Reported on 09/19/2017) 10.6 g 3   No current facility-administered medications on file prior to visit.     Allergies  Allergen Reactions  . Sulfa Antibiotics Hives     Objective:  BP (!) 138/96 (BP Location: Left Arm, Patient Position: Sitting, Cuff Size: Normal)   Pulse 66   Temp 97.9 F (36.6 C) (Oral)   Ht 5\' 2"  (1.575 m)   Wt 98 lb 6.4 oz (44.6 kg)   SpO2 93%   BMI 18.00 kg/m   Physical Exam  Constitutional: She is oriented to person, place, and time. No distress.  Cardiovascular: Normal rate, regular rhythm and normal heart sounds.  Pulmonary/Chest: Effort normal and breath sounds normal. No respiratory distress. She has no wheezes.  Neurological: She is alert and oriented to person, place, and time.  Skin: Skin is warm and dry.  Psychiatric: Judgment normal.  Vitals reviewed.   Assessment and Plan :  1. COPD, moderate (HCC) - Pt presents for medication refill. She was seen in the ED 08/2017 for COPD exacerbation. She was unaware that she was supposed to be using QVAR 2x/day. Discussed SABA vs LABA with pt, she verbalizes understanding. Will try Advair as QVAR is price prohibitive.  - albuterol (PROVENTIL HFA;VENTOLIN HFA) 108 (90 Base) MCG/ACT  inhaler; Inhale 1 puff into the lungs every 6 (six) hours as needed for wheezing or shortness of breath.  Dispense: 6.7 g; Refill: 11 - Fluticasone-Salmeterol (ADVAIR DISKUS) 250-50 MCG/DOSE AEPB; Inhale 1 puff into the lungs 2 (two) times daily.  Dispense: 60 each; Refill: 1  2. Essential hypertension - Controlled okay to fill this dose. RTC in 3-6 months for annual exam.  - metoprolol succinate (TOPROL-XL) 50 MG 24 hr tablet; Take 1 tablet (50 mg total) by mouth daily. Take with  or immediately following a meal.  Dispense: 90 tablet; Refill: 3   Whitney Oumou Smead, PA-C  Primary [facility] at [facility] 09/19/2017 12:00 PM  Please note: Portions of this report may have been transcribed using dragon voice recognition software. Every effort was made to ensure accuracy; however, inadvertent computerized transcription errors may be present.

## 2017-09-19 NOTE — Patient Instructions (Addendum)
Start using your QVAR twice daily. This is your "long acting" medication. This should prevent you from having exacerbations. (I sent in a prescription for advair -- which is also a long acting medicine. This should be cheaper than Qvar. If not, please ask your pharmacy what the most affordable option is)   Albuterol is for "emergency" use only. You should only have to use this occasionally. If you are using this 2-3 times/week, please come back and see [place]   Thank you for coming in today. I hope you feel we met your needs.  Feel free to call PCP if you have any questions or further requests.  Please consider signing up for MyChart if you do not already have it, as this is a great way to communicate with me.  Best,  Whitney McVey, PA-C  IF you received an x-ray today, you will receive an invoice from [facility]. Please contact [facility] at [phone] with questions or concerns regarding your invoice.   IF you received labwork today, you will receive an invoice from [facility]. Please contact [facility] at [phone] with questions or concerns regarding your invoice.   Our billing staff will not be able to assist you with questions regarding bills from these companies.  You will be contacted with the lab results as soon as they are available. The fastest way to get your results is to activate your My Chart account. Instructions are located on the last page of this paperwork. If you have not heard from [place] regarding the results in 2 weeks, please contact this office.

## 2017-11-19 ENCOUNTER — Other Ambulatory Visit: Payer: Self-pay | Admitting: Physician Assistant

## 2017-12-17 ENCOUNTER — Other Ambulatory Visit: Payer: Self-pay | Admitting: Physician Assistant

## 2018-02-09 ENCOUNTER — Telehealth: Payer: Self-pay | Admitting: Physician Assistant

## 2018-02-09 NOTE — Telephone Encounter (Signed)
L/m for pt regarding overdue for CPE.  Requested  that pt contact office to have scheduled. °

## 2018-05-10 ENCOUNTER — Emergency Department (HOSPITAL_COMMUNITY)
Admission: EM | Admit: 2018-05-10 | Discharge: 2018-05-11 | Disposition: A | Discharge status: Home / Self Care | Payer: Medicare Other | Attending: Emergency Medicine | Admitting: Emergency Medicine

## 2018-05-10 ENCOUNTER — Encounter (HOSPITAL_COMMUNITY): Payer: Self-pay | Admitting: Emergency Medicine

## 2018-05-10 ENCOUNTER — Other Ambulatory Visit: Payer: Self-pay

## 2018-05-10 ENCOUNTER — Emergency Department (HOSPITAL_COMMUNITY): Payer: Medicare Other

## 2018-05-10 DIAGNOSIS — R350 Frequency of micturition: Secondary | ICD-10-CM | POA: Diagnosis not present

## 2018-05-10 DIAGNOSIS — N3001 Acute cystitis with hematuria: Secondary | ICD-10-CM | POA: Diagnosis not present

## 2018-05-10 DIAGNOSIS — Z79899 Other long term (current) drug therapy: Secondary | ICD-10-CM | POA: Insufficient documentation

## 2018-05-10 DIAGNOSIS — F17210 Nicotine dependence, cigarettes, uncomplicated: Secondary | ICD-10-CM | POA: Insufficient documentation

## 2018-05-10 DIAGNOSIS — R300 Dysuria: Secondary | ICD-10-CM | POA: Diagnosis not present

## 2018-05-10 DIAGNOSIS — I10 Essential (primary) hypertension: Secondary | ICD-10-CM | POA: Insufficient documentation

## 2018-05-10 DIAGNOSIS — R1084 Generalized abdominal pain: Secondary | ICD-10-CM | POA: Diagnosis not present

## 2018-05-10 DIAGNOSIS — R52 Pain, unspecified: Secondary | ICD-10-CM | POA: Diagnosis not present

## 2018-05-10 DIAGNOSIS — R1031 Right lower quadrant pain: Secondary | ICD-10-CM | POA: Diagnosis not present

## 2018-05-10 DIAGNOSIS — F17228 Nicotine dependence, chewing tobacco, with other nicotine-induced disorders: Secondary | ICD-10-CM | POA: Insufficient documentation

## 2018-05-10 DIAGNOSIS — J449 Chronic obstructive pulmonary disease, unspecified: Secondary | ICD-10-CM | POA: Diagnosis not present

## 2018-05-10 DIAGNOSIS — R1032 Left lower quadrant pain: Secondary | ICD-10-CM | POA: Diagnosis not present

## 2018-05-10 DIAGNOSIS — R1030 Lower abdominal pain, unspecified: Secondary | ICD-10-CM | POA: Diagnosis present

## 2018-05-10 DIAGNOSIS — R112 Nausea with vomiting, unspecified: Secondary | ICD-10-CM | POA: Insufficient documentation

## 2018-05-10 DIAGNOSIS — R110 Nausea: Secondary | ICD-10-CM | POA: Diagnosis not present

## 2018-05-10 DIAGNOSIS — R319 Hematuria, unspecified: Secondary | ICD-10-CM | POA: Diagnosis not present

## 2018-05-10 LAB — URINALYSIS, ROUTINE W REFLEX MICROSCOPIC
Bacteria, UA: NONE SEEN
Bilirubin Urine: NEGATIVE
Glucose, UA: 50 mg/dL — AB
Ketones, ur: NEGATIVE mg/dL
Leukocytes,Ua: NEGATIVE
Nitrite: NEGATIVE
Protein, ur: >=300 mg/dL — AB
RBC / HPF: >50 RBC/hpf — ABNORMAL HIGH (ref 0–5)
Specific Gravity, Urine: 1.028 (ref 1.005–1.030)
pH: 5 (ref 5.0–8.0)

## 2018-05-10 LAB — CBC WITH DIFFERENTIAL/PLATELET
Abs Immature Granulocytes: 0.04 10*3/uL (ref 0.00–0.07)
Basophils Absolute: 0.1 10*3/uL (ref 0.0–0.1)
Basophils Relative: 1 %
Eosinophils Absolute: 0.1 10*3/uL (ref 0.0–0.5)
Eosinophils Relative: 2 %
HCT: 38.6 % (ref 36.0–46.0)
Hemoglobin: 12.1 g/dL (ref 12.0–15.0)
Immature Granulocytes: 0 %
Lymphocytes Relative: 24 %
Lymphs Abs: 2.3 10*3/uL (ref 0.7–4.0)
MCH: 30 pg (ref 26.0–34.0)
MCHC: 31.3 g/dL (ref 30.0–36.0)
MCV: 95.5 fL (ref 80.0–100.0)
Monocytes Absolute: 0.7 10*3/uL (ref 0.1–1.0)
Monocytes Relative: 8 %
Neutro Abs: 6.2 10*3/uL (ref 1.7–7.7)
Neutrophils Relative %: 65 %
Platelets: 261 10*3/uL (ref 150–400)
RBC: 4.04 MIL/uL (ref 3.87–5.11)
RDW: 12.4 % (ref 11.5–15.5)
WBC: 9.5 10*3/uL (ref 4.0–10.5)
nRBC: 0 % (ref 0.0–0.2)

## 2018-05-10 LAB — COMPREHENSIVE METABOLIC PANEL
ALT: 14 U/L (ref 0–44)
AST: 22 U/L (ref 15–41)
Albumin: 4.2 g/dL (ref 3.5–5.0)
Alkaline Phosphatase: 105 U/L (ref 38–126)
Anion gap: 9 (ref 5–15)
BUN: 12 mg/dL (ref 8–23)
CO2: 25 mmol/L (ref 22–32)
Calcium: 8.9 mg/dL (ref 8.9–10.3)
Chloride: 105 mmol/L (ref 98–111)
Creatinine, Ser: 0.76 mg/dL (ref 0.44–1.00)
GFR calc Af Amer: >60 mL/min (ref >60)
GFR calc non Af Amer: >60 mL/min (ref >60)
Glucose, Bld: 122 mg/dL — ABNORMAL HIGH (ref 70–99)
Potassium: 3.5 mmol/L (ref 3.5–5.1)
Sodium: 139 mmol/L (ref 135–145)
Total Bilirubin: 0.5 mg/dL (ref 0.3–1.2)
Total Protein: 7.2 g/dL (ref 6.5–8.1)

## 2018-05-10 MED ORDER — PHENAZOPYRIDINE HCL 200 MG PO TABS
200.0000 mg | ORAL_TABLET | Freq: Three times a day (TID) | ORAL | Status: AC
  Administered 2018-05-11: 200 mg via ORAL
  Filled 2018-05-10: qty 1

## 2018-05-10 MED ORDER — SODIUM CHLORIDE 0.9 % IV BOLUS
1000.0000 mL | Freq: Once | INTRAVENOUS | Status: AC
  Administered 2018-05-10: 1000 mL via INTRAVENOUS

## 2018-05-10 MED ORDER — CIPROFLOXACIN IN D5W 400 MG/200ML IV SOLN
400.0000 mg | Freq: Once | INTRAVENOUS | Status: AC
  Administered 2018-05-10: 400 mg via INTRAVENOUS
  Filled 2018-05-10: qty 200

## 2018-05-10 MED ORDER — SODIUM CHLORIDE (PF) 0.9 % IJ SOLN
INTRAMUSCULAR | Status: AC
  Filled 2018-05-10: qty 50

## 2018-05-10 MED ORDER — MORPHINE SULFATE (PF) 4 MG/ML IV SOLN
4.0000 mg | Freq: Once | INTRAVENOUS | Status: AC
  Administered 2018-05-10: 4 mg via INTRAVENOUS
  Filled 2018-05-10: qty 1

## 2018-05-10 MED ORDER — IOHEXOL 300 MG/ML  SOLN
75.0000 mL | Freq: Once | INTRAMUSCULAR | Status: AC | PRN
  Administered 2018-05-10: 75 mL via INTRAVENOUS

## 2018-05-10 NOTE — ED Notes (Addendum)
Bedside commode placed at bedside. Patient back and forth from bed to commode. Pain 7/10, small amount of bright red bloody urine noted.

## 2018-05-10 NOTE — ED Notes (Signed)
Bed: WA21 Expected date:  Expected time:  Means of arrival:  Comments: hematuria

## 2018-05-10 NOTE — ED Notes (Signed)
Patient transported to CT via stretcher.

## 2018-05-10 NOTE — ED Notes (Signed)
16 mL urine according to bladder scanner.

## 2018-05-10 NOTE — ED Provider Notes (Signed)
[facility]-EMERGENCY DEPT Provider Note   CSN: [id number] Arrival date & time: 05/10/18  2150    History   Chief Complaint No chief complaint on file.   HPI Jasmin Munoz is a 68 y.o. female with a history of recurrent UTI, fibromyalgia, COPD, anxiety, seizures, macular degeneration who presents to the emergency department with a chief complaint of abdominal pain.  The patient endorses non-radiating "pressure-like" bilateral lower abdominal pain that has been constant since onset 2 days ago with associated gross hematuria, urinary dribbling, and urinary frequency.  She reports the pain is somewhat improved with standing.  She reports that she has gone to the bathroom countless times today to try and relieve the pressure.  Pain is been worsening since onset.  She reports that she had a couple of episodes of nonbloody, nonbilious emesis last night, but no vomiting today.  No fever or chills.  She denies back pain, flank pain, shortness of breath, chest pain, vaginal bleeding, discharge, or itching.  She reports a history of recurrent UTIs and has been seen in the ER for similar.  She reports the last time she was here they were "unable to get a clean specimen". No history of nephrolithiasis.  She has been taking her home diazepam without relief.      The history is provided by the patient. No language interpreter was used.    Past Medical History:  Diagnosis Date   Anxiety    Arthritis    Cataract    COPD (chronic obstructive pulmonary disease) (HCC)    Depression    Fibromyalgia    Macular degeneration    Seizures (HCC)     Patient Active Problem List   Diagnosis Date Noted   MDD (major depressive disorder), recurrent episode, severe (HCC) 09/13/2017   COPD (chronic obstructive pulmonary disease) (HCC) 02/03/2013   COPD exacerbation (HCC) 02/03/2013   Hypoxemia 02/03/2013   Generalized anxiety disorder 02/03/2013   HTN (hypertension)  02/03/2013   Nonspecific abnormal electrocardiogram (ECG) (EKG) 02/03/2013   DEPRESSION 05/24/2006   TENDINITIS 05/24/2006    Past Surgical History:  Procedure Laterality Date   ABDOMINAL HYSTERECTOMY       OB History   No obstetric history on file.      Home Medications    Prior to Admission medications   Medication Sig Start Date End Date Taking? Authorizing Provider  albuterol (PROVENTIL HFA;VENTOLIN HFA) 108 (90 Base) MCG/ACT inhaler Inhale 1 puff into the lungs every 6 (six) hours as needed for wheezing or shortness of breath. 09/19/17   McVey, Madelaine BhatElizabeth Whitney, PA-C  beclomethasone (QVAR REDIHALER) 80 MCG/ACT inhaler Inhale 1 puff into the lungs 2 (two) times daily. Patient not taking: Reported on 09/19/2017 08/17/17   Ofilia Neaslark, Michael L, PA-C  ciprofloxacin (CIPRO) 500 MG tablet Take 1 tablet (500 mg total) by mouth 2 (two) times daily for 7 days. 05/11/18 05/18/18  Shalaunda Weatherholtz A, PA-C  citalopram (CELEXA) 20 MG tablet Take 1 tablet (20 mg total) by mouth daily. 09/16/17   Denzil Magnusonhomas, Lashunda, NP  diazepam (VALIUM) 10 MG tablet Take 1 tablet (10 mg total) by mouth every 6 (six) hours as needed for anxiety. 05/23/13   Elvina SidleLauenstein, Kurt, MD  Fluticasone-Salmeterol (ADVAIR DISKUS) 250-50 MCG/DOSE AEPB Inhale 1 puff into the lungs 2 (two) times daily. 09/19/17   McVey, Madelaine BhatElizabeth Whitney, PA-C  metoprolol succinate (TOPROL-XL) 50 MG 24 hr tablet Take 1 tablet (50 mg total) by mouth daily. Take with or immediately following a  meal. 09/19/17   McVey, Madelaine Bhat, PA-C  naproxen sodium (ALEVE) 220 MG tablet Take 220 mg by mouth daily as needed (pain).     [provider]  phenazopyridine (PYRIDIUM) 200 MG tablet Take 1 tablet (200 mg total) by mouth 3 (three) times daily. 05/11/18   Evart Mcdonnell, Coral Else, PA-C    Family History Family History  Problem Relation Age of Onset   Emphysema Mother    Arthritis Father    Arthritis Sister    Joint hypermobility Brother     Social  History Social History   Tobacco Use   Smoking status: Current Every Day Smoker    Packs/day: 1.00    Years: 35.00    Pack years: 35.00    Types: Cigarettes    Last attempt to quit: 01/27/2013    Years since quitting: 5.2   Smokeless tobacco: Current User  Substance Use Topics   Alcohol use: No   Drug use: No     Allergies   Sulfa antibiotics   Review of Systems Review of Systems  Constitutional: Negative for activity change, chills and fever.  Eyes: Negative for visual disturbance.  Respiratory: Negative for shortness of breath.   Cardiovascular: Negative for chest pain.  Gastrointestinal: Positive for abdominal pain, nausea and vomiting. Negative for blood in stool and constipation.  Genitourinary: Positive for difficulty urinating, dysuria, frequency, hematuria and urgency. Negative for dyspareunia, flank pain, genital sores, menstrual problem, vaginal bleeding, vaginal discharge and vaginal pain.  Musculoskeletal: Negative for back pain.  Skin: Negative for rash.  Allergic/Immunologic: Negative for immunocompromised state.  Neurological: Negative for dizziness, weakness, light-headedness and headaches.  Psychiatric/Behavioral: Negative for confusion.   Physical Exam Updated Vital Signs BP 121/86    Pulse 66    Temp 99.6 F (37.6 C) (Rectal)    Resp 15    SpO2 100%   Physical Exam Vitals signs and nursing note reviewed.  Constitutional:      General: She is not in acute distress.    Appearance: She is not ill-appearing.     Comments: She is anxious and appears uncomfortable.  She has been running back and forth between her room in the restroom since arrival to the ER.  Upon arrival into her room, the patient is now sitting on a bedside commode.  Cachectic, chronically ill-appearing female.  HENT:     Head: Normocephalic.  Eyes:     Conjunctiva/sclera: Conjunctivae normal.  Neck:     Musculoskeletal: Neck supple.  Cardiovascular:     Rate and Rhythm: Normal  rate and regular rhythm.     Heart sounds: No murmur. No friction rub. No gallop.   Pulmonary:     Effort: Pulmonary effort is normal. No respiratory distress.  Abdominal:     General: There is no distension.     Palpations: Abdomen is soft.  Skin:    General: Skin is warm.     Findings: No rash.  Neurological:     Mental Status: She is alert.  Psychiatric:        Mood and Affect: Mood is anxious.        Behavior: Behavior normal.      ED Treatments / Results  Labs (all labs ordered are listed, but only abnormal results are displayed) Labs Reviewed  COMPREHENSIVE METABOLIC PANEL - Abnormal; Notable for the following components:      Result Value   Glucose, Bld 122 (*)    All other components within normal limits  URINALYSIS,  ROUTINE W REFLEX MICROSCOPIC - Abnormal; Notable for the following components:   APPearance CLOUDY (*)    Glucose, UA 50 (*)    Hgb urine dipstick LARGE (*)    Protein, ur >=300 (*)    RBC / HPF >50 (*)    All other components within normal limits  URINE CULTURE  CBC WITH DIFFERENTIAL/PLATELET    EKG None  Radiology Ct Abdomen Pelvis W Contrast  Result Date: 05/11/2018 CLINICAL DATA:  UTI, recurrent/complicated. Hematuria. Lower abdominal pain. EXAM: CT ABDOMEN AND PELVIS WITH CONTRAST TECHNIQUE: Multidetector CT imaging of the abdomen and pelvis was performed using the standard protocol following bolus administration of intravenous contrast. CONTRAST:  77mL OMNIPAQUE IOHEXOL 300 MG/ML  SOLN COMPARISON:  CT 08/19/2017 FINDINGS: Lower chest: Lung bases are clear. Hepatobiliary: Tiny hypodensity in the central liver, too small to characterize. No suspicious hepatic lesion. Gallbladder physiologically distended, no calcified stone. No biliary dilatation. Pancreas: No ductal dilatation or inflammation. Spleen: Normal in size without focal abnormality. Adrenals/Urinary Tract: Normal adrenal glands. No hydronephrosis or perinephric edema. Homogeneous renal  enhancement with symmetric excretion on delayed phase imaging. No renal fluid collection. Diffuse urinary bladder wall thickening and enhancement consistent with cystitis. Perivesicular edema. Stomach/Bowel: Bowel evaluation is limited in the absence of enteric contrast and paucity of intra-abdominal fat. Stomach is physiologically distended. No evidence of bowel wall thickening or inflammation. Cecum located deep in the pelvis with liquid and solid stool. Normal appendix. Moderate stool in the ascending and transverse colon. Small volume of stool distally. No colonic inflammation. Vascular/Lymphatic: Aorto bi-iliac atherosclerosis. No aneurysm. Portal vein and mesenteric vessels are patent. No abdominopelvic adenopathy. Reproductive: Status post hysterectomy. No adnexal masses. Other: No free air, free fluid, or intra-abdominal fluid collection. Musculoskeletal: Degenerative change in the spine and both hips. There are no acute or suspicious osseous abnormalities. IMPRESSION: 1. Cystitis.  No evidence of pyelonephritis or stone disease. 2. No additional acute abnormality in the abdomen/pelvis. 3.  Aortic Atherosclerosis (ICD10-I70.0). Electronically Signed   By: Narda Rutherford M.D.   On: 05/11/2018 00:21    Procedures Procedures (including critical care time)  Medications Ordered in ED Medications  morphine 4 MG/ML injection 4 mg (4 mg Intravenous Given 05/10/18 2242)  ciprofloxacin (CIPRO) IVPB 400 mg (0 mg Intravenous Stopped 05/11/18 0108)  iohexol (OMNIPAQUE) 300 MG/ML solution 75 mL (75 mLs Intravenous Contrast Given 05/10/18 2339)  sodium chloride 0.9 % bolus 1,000 mL (0 mLs Intravenous Stopped 05/11/18 0109)  phenazopyridine (PYRIDIUM) tablet 200 mg (200 mg Oral Given 05/11/18 0009)  LORazepam (ATIVAN) injection 1 mg (1 mg Intravenous Given 05/11/18 0106)  ketorolac (TORADOL) 30 MG/ML injection 15 mg (15 mg Intravenous Given 05/11/18 0106)     Initial Impression / Assessment and Plan / ED Course  I  have reviewed the triage vital signs and the nursing notes.  Pertinent labs & imaging results that were available during my care of the patient were reviewed by me and considered in my medical decision making (see chart for details).   68 year old female with a history of recurrent UTI, fibromyalgia, COPD, anxiety, seizures, macular degeneration presenting with gross hematuria and lower abdominal pain.  The patient was discussed with Dr. Elesa Massed, attending physician.  On initial exam, the patient appears very uncomfortable and has been going to and from the room in the restroom every few minutes since arrival to the ER.  Will give morphine for pain control to get a better assessment.  On exam, the patient has tenderness to  palpation in the bilateral lower quadrants.  Abdomen is soft, nondistended.  No tenderness over McBurney's point.  Will order labs, urinalysis, and plan for CT abdomen pelvis.   On initial reevaluation, patient still appeared uncomfortable.  Will order Pyridium and plan to reassess.   No leukocytosis.  No electrolyte abnormalities.  Urinalysis with large hemoglobinuria, many RBCs, 300+ proteinuria, and mild glucosuria.  Urine culture has been sent.  CT abdomen pelvis is consistent with cystitis.   Clinical Course as of May 11 707  Thu May 11, 2018  0981 Patient recheck.  She is sitting on the bedside commode on arrival into the room.  There is a small amount of urine with gross hematuria.  She reports continued pain and appears uncomfortable.  Will order Ativan as she appears anxious and a small dose of Toradol given normal creatinine.  The patient was very bladder scanned and results were varying.  I think it is reasonable to try in and out cath to see if the patient's urinary pressure will improve.   [MM]    Clinical Course User Index [MM] Almus Woodham A, PA-C      On second reevaluation, the patient appeared much more comfortable.  She reported that her pain was  significantly improved and manageable after Ativan and Toradol.  She did not feel as if the in and out catheterization improved her urinary pressure at all.  The patient was given a dose of IV ciprofloxacin in the ER given review of her medical record.  She had one urine culture with strep viridans from several years ago, but no other positive urine cultures with isolated culture and sensitivities.  It appears that previously she had been started on a course of Keflex and had to be switched to ciprofloxacin before her UTI resolved.  Will discharge with ciprofloxacin, Pyridium, and urology follow-up.  I discussed with the patient she should follow-up with urology to have a repeat urinalysis to make sure proteinuria resolves.  Low suspicion for obstructive uropathy, bacteremia, appendicitis, cholecystitis, or pancreatitis.  Spoke with the patient and received permission to contact the patient's daughter and provide her with an update for her care in the ER.  Attempted contacting the patient's daughter on both her home and mobile phone without success and left a HIPAA compliant voicemail on the daughter's cell phone.  I discharge, the patient is hemodynamically stable and in no acute distress.  Safe for discharge home with outpatient follow-up at this time.  Final Clinical Impressions(s) / ED Diagnoses   Final diagnoses:  Acute cystitis with hematuria    ED Discharge Orders         Ordered    ciprofloxacin (CIPRO) 500 MG tablet  2 times daily     05/11/18 0246    phenazopyridine (PYRIDIUM) 200 MG tablet  3 times daily     05/11/18 0246           Jennier Schissler A, PA-C 05/11/18 0709    Ward, Layla Maw, DO 05/11/18 2355

## 2018-05-10 NOTE — ED Notes (Signed)
Patient is unable to sit still, said she is in too much pain/ uncomfortable and feels like her bladder is full.

## 2018-05-10 NOTE — ED Triage Notes (Signed)
Brought in from EMS from home. Blood in urine x2 days, increased urge to urinate, lower abd pain.

## 2018-05-11 DIAGNOSIS — N3001 Acute cystitis with hematuria: Secondary | ICD-10-CM | POA: Diagnosis not present

## 2018-05-11 DIAGNOSIS — R301 Vesical tenesmus: Secondary | ICD-10-CM | POA: Diagnosis not present

## 2018-05-11 MED ORDER — PHENAZOPYRIDINE HCL 200 MG PO TABS: 200.0000 mg | ORAL_TABLET | Freq: Three times a day (TID) | ORAL | 0 refills | Status: DC

## 2018-05-11 MED ORDER — CIPROFLOXACIN HCL 500 MG PO TABS: 500.0000 mg | ORAL_TABLET | Freq: Two times a day (BID) | ORAL | 0 refills | Status: AC

## 2018-05-11 MED ORDER — KETOROLAC TROMETHAMINE 30 MG/ML IJ SOLN
15.0000 mg | Freq: Once | INTRAMUSCULAR | Status: AC
  Administered 2018-05-11: 01:00:00 15 mg via INTRAVENOUS
  Filled 2018-05-11: qty 1

## 2018-05-11 MED ORDER — LORAZEPAM 2 MG/ML IJ SOLN
1.0000 mg | Freq: Once | INTRAMUSCULAR | Status: AC
  Administered 2018-05-11: 1 mg via INTRAVENOUS
  Filled 2018-05-11: qty 1

## 2018-05-11 NOTE — ED Notes (Signed)
In&Out cathed, got about 80 mLs of bloody urine returned. Bladder scanner read 0-8 mLs, Jon, RN repeated and got 6 mL of urine.

## 2018-05-11 NOTE — Discharge Instructions (Addendum)
Thank you for allowing me to care for you today in the Emergency Department.   Your work-up today was consistent with a urinary tract infection.  Your urine has been sent for culture.  Starting tomorrow morning, take 1 tablet of ciprofloxacin in the morning and 1 tablet at night for the next week.  Take 1 tablet of Pyridium by mouth every 8 hours for the next 2 days to help with irritation of the bladder from your infection.  Call urology to schedule a follow-up appointment since you have been having recurrent infections.  If you are unable to get in with urology for couple of weeks, please follow-up with primary care to have a repeat urinalysis to ensure that your infection is improving.   For pain control, you can take 650 mg of Tylenol or 400 mg of ibuprofen once every 6 hours for pain control.  It is very important you take ibuprofen with food to avoid upsetting your stomach, especially since you take citalopram.   Continue to drink plenty of fluids.  Return to the emergency department if you develop uncontrollable pain, if you stop producing urine, if you develop uncontrollable vomiting, high fever despite taking Tylenol and ibuprofen, or other new, concerning symptoms.

## 2018-05-13 LAB — URINE CULTURE: Culture: 40000 — AB

## 2018-05-14 ENCOUNTER — Telehealth: Payer: Self-pay | Admitting: Emergency Medicine

## 2018-05-14 NOTE — Telephone Encounter (Signed)
Post ED Visit - Positive Culture Follow-up  Culture report reviewed by antimicrobial stewardship pharmacist: [facility] []  Enzo Bi, Pharm.D. []  Celedonio Miyamoto, Pharm.D., BCPS AQ-ID []  Garvin Fila, Pharm.D., BCPS []  Georgina Pillion, Pharm.D., BCPS []  [place], [address].D., BCPS, AAHIVP []  Estella Husk, Pharm.D., BCPS, AAHIVP []  Lysle Pearl, PharmD, BCPS []  Phillips Climes, PharmD, BCPS []  Agapito Games, PharmD, BCPS []  Verlan Friends, PharmD []  Mervyn Gay, PharmD, BCPS []  Vinnie Level, PharmD  [facility] []  Len Childs, PharmD [x]  Greer Pickerel, PharmD []  Adalberto Cole, PharmD []  Perlie Gold, Rph []  Lonell Face) Jean Rosenthal, PharmD []  Earl Many, PharmD []  Junita Push, PharmD []  Dorna Leitz, PharmD []  Terrilee Files, PharmD []  Lynann Beaver, PharmD []  Keturah Barre, PharmD []  Loralee Pacas, PharmD []  Bernadene Person, PharmD   Positive urine culture Treated with Cipro, organism sensitive to the same and no further patient follow-up is required at this time.  Carollee Herter Nevaya Nagele 05/14/2018, 4:32 PM

## 2018-05-14 NOTE — Progress Notes (Signed)
ED Antimicrobial Stewardship Positive Culture Follow Up   Jasmin Munoz is an 68 y.o. female who presented to [facility] on 05/10/2018 with a chief complaint of abdominal pain, gross hematuria, urinary dribbling, and urinary frequency. PMH of recurrent UTIs. Note from MD in 2014 that after a few days, Cipro made her jittery and "crazy." However, Cipro was prescribed to patient again in 2018, and patient appears to have tolerated.    Recent Results (from the past 720 hour(s))  Urine culture     Status: Abnormal   Collection Time: 05/10/18 10:43 PM  Result Value Ref Range Status   Specimen Description   Final    URINE, CLEAN CATCH Performed at [facility], [address] [place], [place] [address]    Special Requests   Final    NONE Performed at [facility], [address] [place], [place] [address]    Culture 40,000 COLONIES/mL CORYNEBACTERIUM SPECIES (A)  Final   Report Status 05/13/2018 FINAL  Final    Corynebacterium species likely contaminant. However, continue to treat with Cipro as patient came in with urinary symptoms. Patient Placement RN to call patient to follow up that she is tolerating Cipro without issue.   ED Provider: Buel Ream, PA-C   Greer Pickerel M 05/14/2018, 3:48 PM Clinical Pharmacist [phone]

## 2018-06-05 DIAGNOSIS — N3020 Other chronic cystitis without hematuria: Secondary | ICD-10-CM | POA: Diagnosis not present

## 2018-06-05 DIAGNOSIS — N952 Postmenopausal atrophic vaginitis: Secondary | ICD-10-CM | POA: Diagnosis not present

## 2018-06-05 DIAGNOSIS — N393 Stress incontinence (female) (male): Secondary | ICD-10-CM | POA: Diagnosis not present

## 2018-07-12 ENCOUNTER — Other Ambulatory Visit: Payer: Self-pay | Admitting: Physician Assistant

## 2018-07-12 DIAGNOSIS — I10 Essential (primary) hypertension: Secondary | ICD-10-CM

## 2018-07-12 NOTE — Telephone Encounter (Signed)
Please advise no pending appt

## 2018-07-26 ENCOUNTER — Other Ambulatory Visit: Payer: Self-pay | Admitting: Family Medicine

## 2018-07-26 DIAGNOSIS — I10 Essential (primary) hypertension: Secondary | ICD-10-CM

## 2018-07-26 NOTE — Telephone Encounter (Signed)
Forwarding medication refill to PCP for review. 

## 2018-10-25 ENCOUNTER — Telehealth: Payer: Self-pay | Admitting: Physician Assistant

## 2018-10-25 NOTE — Telephone Encounter (Signed)
Pt is aware she was last seen over a year ago and mcvey is no longer at office. Pt has copd and does not want an appt at this time. Pt would like one more refill on metoprolol call into [facility] [address] . Pt does not want to do virtual visit however she will do telephone visit

## 2018-11-01 NOTE — Telephone Encounter (Signed)
LVM to call office back about refill request.

## 2018-12-03 ENCOUNTER — Observation Stay (HOSPITAL_COMMUNITY)
Admission: EM | Admit: 2018-12-03 | Discharge: 2018-12-04 | Disposition: A | Discharge status: Home / Self Care | Payer: Medicare Other | Attending: Internal Medicine | Admitting: Internal Medicine

## 2018-12-03 ENCOUNTER — Emergency Department (HOSPITAL_COMMUNITY): Payer: Medicare Other

## 2018-12-03 DIAGNOSIS — Z7951 Long term (current) use of inhaled steroids: Secondary | ICD-10-CM | POA: Diagnosis not present

## 2018-12-03 DIAGNOSIS — M47814 Spondylosis without myelopathy or radiculopathy, thoracic region: Secondary | ICD-10-CM | POA: Insufficient documentation

## 2018-12-03 DIAGNOSIS — Z20828 Contact with and (suspected) exposure to other viral communicable diseases: Secondary | ICD-10-CM | POA: Insufficient documentation

## 2018-12-03 DIAGNOSIS — R300 Dysuria: Secondary | ICD-10-CM | POA: Insufficient documentation

## 2018-12-03 DIAGNOSIS — H3530 Unspecified macular degeneration: Secondary | ICD-10-CM | POA: Diagnosis not present

## 2018-12-03 DIAGNOSIS — R0789 Other chest pain: Secondary | ICD-10-CM | POA: Diagnosis not present

## 2018-12-03 DIAGNOSIS — Z79899 Other long term (current) drug therapy: Secondary | ICD-10-CM | POA: Insufficient documentation

## 2018-12-03 DIAGNOSIS — I10 Essential (primary) hypertension: Secondary | ICD-10-CM | POA: Insufficient documentation

## 2018-12-03 DIAGNOSIS — R9431 Abnormal electrocardiogram [ECG] [EKG]: Secondary | ICD-10-CM | POA: Diagnosis not present

## 2018-12-03 DIAGNOSIS — M797 Fibromyalgia: Secondary | ICD-10-CM | POA: Diagnosis not present

## 2018-12-03 DIAGNOSIS — M8580 Other specified disorders of bone density and structure, unspecified site: Secondary | ICD-10-CM | POA: Diagnosis not present

## 2018-12-03 DIAGNOSIS — M1990 Unspecified osteoarthritis, unspecified site: Secondary | ICD-10-CM | POA: Insufficient documentation

## 2018-12-03 DIAGNOSIS — F329 Major depressive disorder, single episode, unspecified: Secondary | ICD-10-CM | POA: Diagnosis not present

## 2018-12-03 DIAGNOSIS — F17210 Nicotine dependence, cigarettes, uncomplicated: Secondary | ICD-10-CM | POA: Insufficient documentation

## 2018-12-03 DIAGNOSIS — R079 Chest pain, unspecified: Secondary | ICD-10-CM | POA: Diagnosis not present

## 2018-12-03 DIAGNOSIS — F419 Anxiety disorder, unspecified: Secondary | ICD-10-CM | POA: Diagnosis not present

## 2018-12-03 DIAGNOSIS — R0689 Other abnormalities of breathing: Secondary | ICD-10-CM | POA: Diagnosis not present

## 2018-12-03 DIAGNOSIS — R262 Difficulty in walking, not elsewhere classified: Secondary | ICD-10-CM | POA: Insufficient documentation

## 2018-12-03 DIAGNOSIS — J441 Chronic obstructive pulmonary disease with (acute) exacerbation: Secondary | ICD-10-CM | POA: Diagnosis not present

## 2018-12-03 DIAGNOSIS — R0602 Shortness of breath: Secondary | ICD-10-CM | POA: Diagnosis not present

## 2018-12-03 LAB — CBC WITH DIFFERENTIAL/PLATELET
Abs Immature Granulocytes: 0.01 10*3/uL (ref 0.00–0.07)
Basophils Absolute: 0 10*3/uL (ref 0.0–0.1)
Basophils Relative: 0 %
Eosinophils Absolute: 0.2 10*3/uL (ref 0.0–0.5)
Eosinophils Relative: 2 %
HCT: 39 % (ref 36.0–46.0)
Hemoglobin: 12.1 g/dL (ref 12.0–15.0)
Immature Granulocytes: 0 %
Lymphocytes Relative: 31 %
Lymphs Abs: 2.4 10*3/uL (ref 0.7–4.0)
MCH: 29.7 pg (ref 26.0–34.0)
MCHC: 31 g/dL (ref 30.0–36.0)
MCV: 95.8 fL (ref 80.0–100.0)
Monocytes Absolute: 0.5 10*3/uL (ref 0.1–1.0)
Monocytes Relative: 7 %
Neutro Abs: 4.5 10*3/uL (ref 1.7–7.7)
Neutrophils Relative %: 60 %
Platelets: 218 10*3/uL (ref 150–400)
RBC: 4.07 MIL/uL (ref 3.87–5.11)
RDW: 12.2 % (ref 11.5–15.5)
WBC: 7.7 10*3/uL (ref 4.0–10.5)
nRBC: 0 % (ref 0.0–0.2)

## 2018-12-03 LAB — COMPREHENSIVE METABOLIC PANEL
ALT: 15 U/L (ref 0–44)
AST: 24 U/L (ref 15–41)
Albumin: 3.7 g/dL (ref 3.5–5.0)
Alkaline Phosphatase: 111 U/L (ref 38–126)
Anion gap: 9 (ref 5–15)
BUN: 6 mg/dL — ABNORMAL LOW (ref 8–23)
CO2: 26 mmol/L (ref 22–32)
Calcium: 9 mg/dL (ref 8.9–10.3)
Chloride: 108 mmol/L (ref 98–111)
Creatinine, Ser: 0.87 mg/dL (ref 0.44–1.00)
GFR calc Af Amer: >60 mL/min (ref >60)
GFR calc non Af Amer: >60 mL/min (ref >60)
Glucose, Bld: 96 mg/dL (ref 70–99)
Potassium: 4.1 mmol/L (ref 3.5–5.1)
Sodium: 143 mmol/L (ref 135–145)
Total Bilirubin: <0.1 mg/dL — ABNORMAL LOW (ref 0.3–1.2)
Total Protein: 7 g/dL (ref 6.5–8.1)

## 2018-12-03 LAB — D-DIMER, QUANTITATIVE: D-Dimer, Quant: 0.67 ug/mL-FEU — ABNORMAL HIGH (ref 0.00–0.50)

## 2018-12-03 LAB — TROPONIN I (HIGH SENSITIVITY): Troponin I (High Sensitivity): 6 ng/L (ref <18)

## 2018-12-03 LAB — LACTIC ACID, PLASMA: Lactic Acid, Venous: 1.3 mmol/L (ref 0.5–1.9)

## 2018-12-03 LAB — SARS CORONAVIRUS 2 BY RT PCR (HOSPITAL ORDER, PERFORMED IN ~~LOC~~ HOSPITAL LAB): SARS Coronavirus 2: NEGATIVE

## 2018-12-03 LAB — BRAIN NATRIURETIC PEPTIDE: B Natriuretic Peptide: 109.5 pg/mL — ABNORMAL HIGH (ref 0.0–100.0)

## 2018-12-03 MED ORDER — METOPROLOL SUCCINATE ER 25 MG PO TB24
50.0000 mg | ORAL_TABLET | Freq: Every day | ORAL | Status: DC
  Administered 2018-12-03: 50 mg via ORAL
  Filled 2018-12-03 (×2): qty 2

## 2018-12-03 MED ORDER — IPRATROPIUM-ALBUTEROL 0.5-2.5 (3) MG/3ML IN SOLN: 3.0000 mL | Freq: Once | RESPIRATORY_TRACT | Status: DC

## 2018-12-03 MED ORDER — IPRATROPIUM BROMIDE HFA 17 MCG/ACT IN AERS
4.0000 | INHALATION_SPRAY | Freq: Once | RESPIRATORY_TRACT | Status: DC
  Filled 2018-12-03: qty 12.9

## 2018-12-03 MED ORDER — DIAZEPAM 5 MG PO TABS
10.0000 mg | ORAL_TABLET | Freq: Four times a day (QID) | ORAL | Status: DC | PRN
  Administered 2018-12-03: 10 mg via ORAL
  Filled 2018-12-03: qty 2

## 2018-12-03 MED ORDER — ENOXAPARIN SODIUM 40 MG/0.4ML ~~LOC~~ SOLN
40.0000 mg | SUBCUTANEOUS | Status: DC
  Filled 2018-12-03: qty 0.4

## 2018-12-03 MED ORDER — METHYLPREDNISOLONE SODIUM SUCC 125 MG IJ SOLR
125.0000 mg | Freq: Once | INTRAMUSCULAR | Status: AC
  Administered 2018-12-03: 125 mg via INTRAVENOUS
  Filled 2018-12-03: qty 2

## 2018-12-03 MED ORDER — PREDNISONE 20 MG PO TABS
40.0000 mg | ORAL_TABLET | Freq: Every day | ORAL | Status: DC
  Administered 2018-12-04: 40 mg via ORAL
  Filled 2018-12-03: qty 2

## 2018-12-03 MED ORDER — MOMETASONE FURO-FORMOTEROL FUM 100-5 MCG/ACT IN AERO
2.0000 | INHALATION_SPRAY | Freq: Two times a day (BID) | RESPIRATORY_TRACT | Status: DC
  Administered 2018-12-04: 2 via RESPIRATORY_TRACT
  Filled 2018-12-03: qty 8.8

## 2018-12-03 MED ORDER — CITALOPRAM HYDROBROMIDE 20 MG PO TABS
20.0000 mg | ORAL_TABLET | Freq: Every day | ORAL | Status: DC
  Administered 2018-12-03 – 2018-12-04 (×2): 20 mg via ORAL
  Filled 2018-12-03 (×2): qty 1

## 2018-12-03 MED ORDER — IPRATROPIUM-ALBUTEROL 0.5-2.5 (3) MG/3ML IN SOLN
3.0000 mL | Freq: Four times a day (QID) | RESPIRATORY_TRACT | Status: DC
  Administered 2018-12-03 (×2): 3 mL via RESPIRATORY_TRACT
  Filled 2018-12-03: qty 3

## 2018-12-03 MED ORDER — AEROCHAMBER PLUS FLO-VU MISC
1.0000 | Freq: Once | Status: DC
  Filled 2018-12-03: qty 1

## 2018-12-03 MED ORDER — IPRATROPIUM-ALBUTEROL 0.5-2.5 (3) MG/3ML IN SOLN
3.0000 mL | Freq: Once | RESPIRATORY_TRACT | Status: AC
  Administered 2018-12-03: 3 mL via RESPIRATORY_TRACT
  Filled 2018-12-03: qty 3

## 2018-12-03 MED ORDER — FLUTICASONE FUROATE-VILANTEROL 200-25 MCG/INH IN AEPB: 1.0000 | INHALATION_SPRAY | Freq: Every day | RESPIRATORY_TRACT | Status: DC

## 2018-12-03 MED ORDER — IPRATROPIUM-ALBUTEROL 0.5-2.5 (3) MG/3ML IN SOLN
3.0000 mL | Freq: Two times a day (BID) | RESPIRATORY_TRACT | Status: DC
  Administered 2018-12-04: 3 mL via RESPIRATORY_TRACT
  Filled 2018-12-03: qty 3

## 2018-12-03 MED ORDER — ALBUTEROL SULFATE HFA 108 (90 BASE) MCG/ACT IN AERS
8.0000 | INHALATION_SPRAY | Freq: Once | RESPIRATORY_TRACT | Status: AC
  Administered 2018-12-03: 8 via RESPIRATORY_TRACT
  Filled 2018-12-03: qty 6.7

## 2018-12-03 NOTE — ED Provider Notes (Signed)
[facility] EMERGENCY DEPARTMENT Provider Note   CSN: [id number] Arrival date & time: 12/03/18  1053     History   Chief Complaint Chief Complaint  Patient presents with  . Shortness of Breath    HPI RIKI BERNINGER is a 68 y.o. female.     ONELIA CADMUS is a 68 y.o. female with history of COPD, fibromyalgia, macular degeneration, seizures, anxiety and depression, who presents to the ED with EMS for evaluation of shortness of breath.  Patient noted to be in respiratory distress when EMS arrived with significantly increased work of breathing, although O2 sats were 95%, she was placed on a nonrebreather with improvement in her work of breathing.  Patient reports that she has been feeling increasingly short of breath over the past week.  She has noted an intermittent cough but no fevers.  She has had some right-sided chest pain which she reports is a constant ache, it is not worse with exertion and is nonradiating.  She also reports it is nonpleuritic.  She denies any associated abdominal pain, nausea, vomiting or diaphoresis.  No diarrhea.  She has not had improvement with her home inhalers.  She is still an active smoker, but has not been able to smoke this week due to worsening breathing.  No known sick contacts.  EMS noted some swelling in her legs which patient reports is chronic for her and unchanged or worsened.     Past Medical History:  Diagnosis Date  . Anxiety   . Arthritis   . Cataract   . COPD (chronic obstructive pulmonary disease) (HCC)   . Depression   . Fibromyalgia   . Macular degeneration   . Seizures [facility])     Patient Active Problem List   Diagnosis Date Noted  . MDD (major depressive disorder), recurrent episode, severe (HCC) 09/13/2017  . COPD (chronic obstructive pulmonary disease) (HCC) 02/03/2013  . COPD exacerbation (HCC) 02/03/2013  . Hypoxemia 02/03/2013  . Generalized anxiety disorder 02/03/2013  . HTN (hypertension) 02/03/2013   . Nonspecific abnormal electrocardiogram (ECG) (EKG) 02/03/2013  . DEPRESSION 05/24/2006  . TENDINITIS 05/24/2006    Past Surgical History:  Procedure Laterality Date  . ABDOMINAL HYSTERECTOMY       OB History   No obstetric history on file.      Home Medications    Prior to Admission medications   Medication Sig Start Date End Date Taking? Authorizing Provider  albuterol (PROVENTIL HFA;VENTOLIN HFA) 108 (90 Base) MCG/ACT inhaler Inhale 1 puff into the lungs every 6 (six) hours as needed for wheezing or shortness of breath. 09/19/17   McVey, Madelaine Bhat, PA-C  beclomethasone (QVAR REDIHALER) 80 MCG/ACT inhaler Inhale 1 puff into the lungs 2 (two) times daily. Patient not taking: Reported on 09/19/2017 08/17/17   Ofilia Neas, PA-C  citalopram (CELEXA) 20 MG tablet Take 1 tablet (20 mg total) by mouth daily. 09/16/17   Denzil Magnuson, NP  diazepam (VALIUM) 10 MG tablet Take 1 tablet (10 mg total) by mouth every 6 (six) hours as needed for anxiety. 05/23/13   Elvina Sidle, MD  Fluticasone-Salmeterol (ADVAIR DISKUS) 250-50 MCG/DOSE AEPB Inhale 1 puff into the lungs 2 (two) times daily. 09/19/17   McVey, Madelaine Bhat, PA-C  metoprolol succinate (TOPROL-XL) 50 MG 24 hr tablet TAKE 1 TABLET BY MOUTH DAILY WITH OR IMMEDIATELY FOLLOWING A MEAL 07/12/18   Stallings, Zoe A, MD  naproxen sodium (ALEVE) 220 MG tablet Take 220 mg by mouth daily as  needed (pain).     [provider]  phenazopyridine (PYRIDIUM) 200 MG tablet Take 1 tablet (200 mg total) by mouth 3 (three) times daily. 05/11/18   McDonald, Mia A, PA-C    Family History Family History  Problem Relation Age of Onset  . Emphysema Mother   . Arthritis Father   . Arthritis Sister   . Joint hypermobility Brother     Social History Social History   Tobacco Use  . Smoking status: Current Every Day Smoker    Packs/day: 1.00    Years: 35.00    Pack years: 35.00    Types: Cigarettes    Last attempt to  quit: 01/27/2013    Years since quitting: 5.8  . Smokeless tobacco: Current User  Substance Use Topics  . Alcohol use: No  . Drug use: No     Allergies   Sulfa antibiotics   Review of Systems Review of Systems  Constitutional: Negative for chills and fever.  HENT: Negative for congestion, rhinorrhea and sore throat.   Respiratory: Positive for cough, chest tightness, shortness of breath and wheezing.   Cardiovascular: Positive for chest pain and leg swelling (Chronic). Negative for palpitations.  Gastrointestinal: Negative for abdominal pain, nausea and vomiting.  Genitourinary: Negative for dysuria.  Musculoskeletal: Negative for arthralgias and myalgias.  Skin: Negative for color change and rash.  Neurological: Negative for dizziness, syncope and light-headedness.     Physical Exam Updated Vital Signs BP (!) 129/93 (BP Location: Right Arm)   Pulse 61   Temp 98.6 F (37 C) (Oral)   Resp 16   SpO2 100%   Physical Exam Vitals signs and nursing note reviewed.  Constitutional:      General: She is not in acute distress.    Appearance: She is well-developed. She is not ill-appearing or diaphoretic.  HENT:     Head: Normocephalic and atraumatic.  Eyes:     General:        Right eye: No discharge.        Left eye: No discharge.     Pupils: Pupils are equal, round, and reactive to light.  Neck:     Musculoskeletal: Neck supple.  Cardiovascular:     Rate and Rhythm: Normal rate and regular rhythm.     Heart sounds: Normal heart sounds. No murmur. No friction rub. No gallop.   Pulmonary:     Effort: Tachypnea and accessory muscle usage present. No respiratory distress.     Breath sounds: Wheezing present. No rales.     Comments: On arrival patient on nonrebreather with tachypnea and accessory muscle use, able to speak in very short sentences, lungs with decreased air movement throughout and some faint expiratory wheezing. Chest:     Chest wall: Tenderness present.      Comments: Some mild chest tenderness over the right lateral ribs without appreciable deformity or skin changes. Abdominal:     General: Bowel sounds are normal. There is no distension.     Palpations: Abdomen is soft. There is no mass.     Tenderness: There is no abdominal tenderness. There is no guarding.     Comments: Abdomen soft, nondistended, nontender to palpation in all quadrants without guarding or peritoneal signs  Musculoskeletal:        General: No deformity.     Comments: Bilateral lower extremities with faint bilateral nonpitting edema which patient reports is chronic and unchanged  Skin:    General: Skin is warm and dry.  Capillary Refill: Capillary refill takes less than 2 seconds.  Neurological:     Mental Status: She is alert.     Coordination: Coordination normal.     Comments: Speech is clear, able to follow commands Moves extremities without ataxia, coordination intact  Psychiatric:        Mood and Affect: Mood normal.        Behavior: Behavior normal.      ED Treatments / Results  Labs (all labs ordered are listed, but only abnormal results are displayed) Labs Reviewed  COMPREHENSIVE METABOLIC PANEL - Abnormal; Notable for the following components:      Result Value   BUN 6 (*)    Total Bilirubin <0.1 (*)    All other components within normal limits  D-DIMER, QUANTITATIVE (NOT AT [facility]) - Abnormal; Notable for the following components:   D-Dimer, Quant 0.67 (*)    All other components within normal limits  BRAIN NATRIURETIC PEPTIDE - Abnormal; Notable for the following components:   B Natriuretic Peptide 109.5 (*)    All other components within normal limits  SARS CORONAVIRUS 2 BY RT PCR (HOSPITAL ORDER, PERFORMED IN [facility])  CULTURE, BLOOD (ROUTINE X 2)  CULTURE, BLOOD (ROUTINE X 2)  LACTIC ACID, PLASMA  CBC WITH DIFFERENTIAL/PLATELET  LACTIC ACID, PLASMA  TROPONIN I (HIGH SENSITIVITY)  TROPONIN I (HIGH SENSITIVITY)    EKG  None  Radiology Dg Chest Port 1 View  Result Date: 12/03/2018 CLINICAL DATA:  Shortness of breath. EXAM: PORTABLE CHEST 1 VIEW COMPARISON:  08/19/2017 FINDINGS: Normal sized heart. Clear lungs. The lungs remain hyperexpanded. Diffuse osteopenia. Lower thoracic spine degenerative changes. IMPRESSION: Stable changes of COPD. No acute abnormality. Electronically Signed   By: Beckie SaltsSteven  Reid M.D.   On: 12/03/2018 12:18    Procedures .Critical Care Performed by: Dartha LodgeFord, Kelsey N, PA-C Authorized by: Dartha LodgeFord, Kelsey N, PA-C   Critical care provider statement:    Critical care time (minutes):  45   Critical care was necessary to treat or prevent imminent or life-threatening deterioration of the following conditions:  Respiratory failure   Critical care was time spent personally by me on the following activities:  Discussions with consultants, evaluation of patient's response to treatment, examination of patient, ordering and performing treatments and interventions, ordering and review of laboratory studies, ordering and review of radiographic studies, pulse oximetry, re-evaluation of patient's condition, obtaining history from patient or surrogate and review of old charts   (including critical care time)  Medications Ordered in ED Medications  aerochamber plus with mask device 1 each (has no administration in time range)  ipratropium (ATROVENT HFA) inhaler 4 puff (4 puffs Inhalation Not Given 12/03/18 1242)  ipratropium-albuterol (DUONEB) 0.5-2.5 (3) MG/3ML nebulizer solution 3 mL (has no administration in time range)  albuterol (VENTOLIN HFA) 108 (90 Base) MCG/ACT inhaler 8 puff (8 puffs Inhalation Given 12/03/18 1135)  methylPREDNISolone sodium succinate (SOLU-MEDROL) 125 mg/2 mL injection 125 mg (125 mg Intravenous Given 12/03/18 1136)  ipratropium-albuterol (DUONEB) 0.5-2.5 (3) MG/3ML nebulizer solution 3 mL (3 mLs Nebulization Given 12/03/18 1404)     Initial Impression / Assessment and Plan / ED  Course  I have reviewed the triage vital signs and the nursing notes.  Pertinent labs & imaging results that were available during my care of the patient were reviewed by me and considered in my medical decision making (see chart for details).  68 year old female presents via EMS with worsening shortness of breath over the past week.  With EMS patient with significantly increased respiratory effort in respiratory distress although with no hypoxia, so was placed on nonrebreather with improvement.  On arrival patient is tachypneic with accessory muscle use satting well on NRB, decreased air movement throughout with some faint expiratory wheezing.  Patient reports this feels a bit different than her typical COPD and she has had some right-sided chest pain.  Will get rapid Covid swab, chest x-ray, EKG, basic labs, troponin, BNP and D-dimer.  In the meantime we will treat for likely COPD exacerbation with albuterol, Atrovent, and steroids.  After initial treatments able to wean patient down from nonrebreather to 3 L nasal cannula  Work-up has been very reassuring, EKG without ischemic changes, no leukocytosis, no significant electrolyte derangements, negative troponin, BNP is not significantly elevated, age-adjusted D-dimer is not elevated, lactic acid normal, Covid test negative.  Chest x-ray suggestive of COPD with no infiltrates or edema.  On reevaluation after albuterol patient reports improvement in her breathing, lungs with better air movement still with some wheezing, now that Covid test has returned negative will order duo nebs for the patient.  After 2 breathing treatments patient has been weaned off of oxygen and at rest is satting at 93%, but when patient attempted to get up and ambulate she desatted to 88% with significantly increased work of breathing, feel patient would benefit from admission for continued treatment of COPD exacerbation.  Will consult for medical admission.  Case discussed with  internal medicine teaching service who will see and admit the patient.  Final Clinical Impressions(s) / ED Diagnoses   Final diagnoses:  COPD exacerbation [facility])    ED Discharge Orders    None       Legrand Rams 12/03/18 1630    Gerhard Munch, MD 12/04/18 1622    Gerhard Munch, MD 12/04/18 1624

## 2018-12-03 NOTE — ED Notes (Signed)
Report attempted 

## 2018-12-03 NOTE — ED Triage Notes (Signed)
Pt from home with complaints of SOB X1 week. EMS upon arrival stated Pt SpO2 was 95% but struggling and working hard to get air. Pt placed on NRB to provide respiratory comfort and ease.   Pt states she can only walk a few steps before becoming SOB

## 2018-12-03 NOTE — H&P (Signed)
Date: 12/03/2018               Patient Name:  Jasmin Munoz MRN: [id number]  DOB: 1950/03/01 Age / Sex: 68 y.o., female   PCP: Patient, No Pcp Per         Medical Service: Internal Medicine Teaching Service         Attending Physician: Dr. Lucious Groves, DO    First Contact: Dr. Court Joy Pager: [phone]  Second Contact: Dr. Koleen Distance Pager: [phone]       After Hours (After 5p/  First Contact Pager: [phone]  weekends / holidays): Second Contact Pager: [phone]   Chief Complaint: shortness of breath   History of Present Illness: Jasmin Munoz is a 68 y/o female with history of COPD, HTN , anxiety, and depression who presents for 1 week of progressive DOE. She also complains of right sided chest wall pain and dry cough. She has been mostly compliant with Adviar every morning. Was having to use rescue inhaler more this week. This is her second hospitalization for COPD. Continues to smoke , but has cut down. Denies  fevers, chills, sick contacts, vision changes, substernal chest pain, abdominal pain, changes in BMs, and lower extremity swelling.   In addition patient reports admissions a few months ago for urinary tract infections and believes these symptoms restarted in the past 1-2 weeks. She has urgency and dysuria. She says she was scheduled for cystoscopy , but was unable to keep appointment.   Meds:   Current Meds  Medication Sig   albuterol (PROVENTIL HFA;VENTOLIN HFA) 108 (90 Base) MCG/ACT inhaler Inhale 1 puff into the lungs every 6 (six) hours as needed for wheezing or shortness of breath.   beclomethasone (QVAR REDIHALER) 80 MCG/ACT inhaler Inhale 1 puff into the lungs 2 (two) times daily.   citalopram (CELEXA) 20 MG tablet Take 1 tablet (20 mg total) by mouth daily. (Patient taking differently: Take 20 mg by mouth every evening. )   diazepam (VALIUM) 10 MG tablet Take 1 tablet (10 mg total) by mouth every 6 (six) hours as needed for anxiety.   Fluticasone-Salmeterol  (ADVAIR DISKUS) 250-50 MCG/DOSE AEPB Inhale 1 puff into the lungs 2 (two) times daily.   metoprolol succinate (TOPROL-XL) 50 MG 24 hr tablet TAKE 1 TABLET BY MOUTH DAILY WITH OR IMMEDIATELY FOLLOWING A MEAL (Patient taking differently: Take 50 mg by mouth every evening. )   naproxen sodium (ALEVE) 220 MG tablet Take 220 mg by mouth daily as needed (pain).      Allergies: Allergies as of 12/03/2018 - Review Complete 12/03/2018  Allergen Reaction Noted   Sulfa antibiotics Hives 06/17/2011   Past Medical History:  Diagnosis Date   Anxiety    Arthritis    Cataract    COPD (chronic obstructive pulmonary disease) (HCC)    Depression    Fibromyalgia    Macular degeneration    Seizures (HCC)     Family History: stroke (paternal grandparents), cancer (father - SCC on face), lung cancer (uncle), emphysema (mother)   Social History: Lives alone, going through a difficult time with husband's decline in health from Alzheimer's. Smokes 10/day (cut back from 2 packs), started at 23. No EtOH or illicit substance use.   Review of Systems: A complete ROS was negative except as per HPI.   Physical Exam: Blood pressure 111/88, pulse 64, temperature 98.6 F (37 C), temperature source Oral, resp. rate 15, SpO2 95 %.  Physical Exam Constitutional:  General: She is not in acute distress. Cardiovascular:     Rate and Rhythm: Normal rate and regular rhythm.     Heart sounds: No murmur. No friction rub. No gallop.   Pulmonary:     Breath sounds: Examination of the right-upper field reveals wheezing. Examination of the left-upper field reveals wheezing. Examination of the right-middle field reveals decreased breath sounds. Examination of the left-middle field reveals decreased breath sounds. Examination of the right-lower field reveals decreased breath sounds. Examination of the left-lower field reveals decreased breath sounds. Decreased breath sounds and wheezing present.  Abdominal:      General: Bowel sounds are normal.     Palpations: There is no mass.     Tenderness: There is abdominal tenderness (mild rebound tenderness in llq). There is no guarding.  Musculoskeletal:     Right lower leg: No edema.     Left lower leg: No edema.  Skin:    General: Skin is warm and dry.  Neurological:     Mental Status: She is alert and oriented to person, place, and time.  Psychiatric:        Mood and Affect: Mood normal.        Behavior: Behavior normal.     CXR: personally reviewed my interpretation is hyperinflated. No focal opacities.   Assessment & Plan by Problem: Active Problems:   COPD exacerbation (HCC) Jasmin Munoz is a 68 y/o female with history of COPD, HTN , anxiety, and depression who presents for 1 week of progressive DOE concerning for COPD exacerbation.   Worked up in ED for other causes of CP and DOE with unremarkable lab findings. BNP mild elevation (109), Troponin neg, lactic acid nl, d-dimer nl after age correction.   #COPD exacerbation Increased use of rescue inhaler and progressive DOE over the past week. Dry cough. Afebrile. Normal CBC  - Prednisone 40 for 5 days - Duonebs q6hrs   #Dysuria Patient seen back in April of this year at [facility] long ED for urinary pressure. Given ativan and toradol. In addition treated for UTI with IV ciprofloxacin and sent out on ciprofloxacin and pyridium. Patient scheduled for urology follow up, but was not able to make this appointment.  - U/A  #MDD and anxiety - Continue Celexa - Continue diazepam   Dispo: Admit patient to Observation with expected length of stay less than 2 midnights.  Signed: Thurmon Fair, MD PGY1  [phone]

## 2018-12-03 NOTE — ED Notes (Addendum)
Pt ambulated to restroom. Before ambulation pt SpO2 90-92% RA. During ambulation pt maintained between 88-92%. Pt did not tolerate very well. Pt felt very short of breath / auditory wheezing and stated she is not able to catch her breath ( not her norm.) Pt at 88% when getting back into bed. Within 5-7 minutes pt is resting comfortably and SpO2 is 95%.

## 2018-12-04 ENCOUNTER — Encounter (HOSPITAL_COMMUNITY): Payer: Self-pay | Admitting: *Deleted

## 2018-12-04 ENCOUNTER — Other Ambulatory Visit: Payer: Self-pay

## 2018-12-04 DIAGNOSIS — F419 Anxiety disorder, unspecified: Secondary | ICD-10-CM

## 2018-12-04 DIAGNOSIS — Z87440 Personal history of urinary (tract) infections: Secondary | ICD-10-CM | POA: Diagnosis not present

## 2018-12-04 DIAGNOSIS — F17210 Nicotine dependence, cigarettes, uncomplicated: Secondary | ICD-10-CM

## 2018-12-04 DIAGNOSIS — J441 Chronic obstructive pulmonary disease with (acute) exacerbation: Secondary | ICD-10-CM | POA: Diagnosis not present

## 2018-12-04 DIAGNOSIS — F329 Major depressive disorder, single episode, unspecified: Secondary | ICD-10-CM | POA: Diagnosis not present

## 2018-12-04 DIAGNOSIS — Z79899 Other long term (current) drug therapy: Secondary | ICD-10-CM | POA: Diagnosis not present

## 2018-12-04 DIAGNOSIS — Z881 Allergy status to other antibiotic agents status: Secondary | ICD-10-CM

## 2018-12-04 DIAGNOSIS — I10 Essential (primary) hypertension: Secondary | ICD-10-CM

## 2018-12-04 DIAGNOSIS — R300 Dysuria: Secondary | ICD-10-CM

## 2018-12-04 LAB — URINALYSIS, COMPLETE (UACMP) WITH MICROSCOPIC
Bacteria, UA: NONE SEEN
Bilirubin Urine: NEGATIVE
Glucose, UA: 50 mg/dL — AB
Hgb urine dipstick: NEGATIVE
Ketones, ur: NEGATIVE mg/dL
Leukocytes,Ua: NEGATIVE
Nitrite: NEGATIVE
Protein, ur: NEGATIVE mg/dL
Specific Gravity, Urine: 1.016 (ref 1.005–1.030)
pH: 6 (ref 5.0–8.0)

## 2018-12-04 LAB — HIV ANTIBODY (ROUTINE TESTING W REFLEX): HIV Screen 4th Generation wRfx: NONREACTIVE

## 2018-12-04 MED ORDER — PREDNISONE 20 MG PO TABS: 40.0000 mg | ORAL_TABLET | Freq: Every day | ORAL | 0 refills | Status: AC

## 2018-12-04 MED ORDER — PREDNISONE 20 MG PO TABS: 40.0000 mg | ORAL_TABLET | Freq: Every day | ORAL | 0 refills | Status: DC

## 2018-12-04 MED ORDER — IPRATROPIUM-ALBUTEROL 0.5-2.5 (3) MG/3ML IN SOLN: 3.0000 mL | Freq: Four times a day (QID) | RESPIRATORY_TRACT | Status: DC | PRN

## 2018-12-04 NOTE — Evaluation (Signed)
Physical Therapy Evaluation Patient Details Name: Jasmin Munoz MRN: [id number] DOB: January 03, 1951 Today's Date: 12/04/2018   History of Present Illness  68 y.o. female with history of COPD, fibromyalgia, macular degeneration, seizures, anxiety and depression, who presents to the ED with EMS for evaluation of shortness of breath, tachypnea with accessory muscle usage. Admitted 12/03/18 for treatment of COPD exacerbation.   Clinical Impression  PTA pt living alone in 2 story home with bed and bath upstairs. Pt reports increasing difficulty with making King sized bed and vacuuming in the last couple months. Pt is currently at least min guard with all mobility, and is able to maintain SaO2 >90%O2 on RA despite 3/4 DoE with ambulation and 4/4 DoE with climbing stairs. PT educated pt on need for small bouts of exercise to work on Scientist, clinical (histocompatibility and immunogenetics). Pt in agreement. PT does not recommend any PT services or equipment at d/c, however will continue to work with pt acutely, although plan is for d/c this afternoon.      Follow Up Recommendations No PT follow up;Supervision - Intermittent    Equipment Recommendations  None recommended by PT           Mobility  Bed Mobility Overal bed mobility: Modified Independent             General bed mobility comments: sitting in recliner on entry   Transfers Overall transfer level: Needs assistance Equipment used: None Transfers: Sit to/from Stand Sit to Stand: Supervision         General transfer comment: supervision for safety and balance  Ambulation/Gait Ambulation/Gait assistance: Min guard Gait Distance (Feet): 300 Feet Assistive device: None Gait Pattern/deviations: Step-through pattern;WFL(Within Functional Limits) Gait velocity: slowed Gait velocity interpretation: 1.31 - 2.62 ft/sec, indicative of limited community ambulator General Gait Details: min guard for safety with slow, steady gait, pt with 3/4 DOE after ambulation on  RA  Stairs Stairs: Yes Stairs assistance: Min guard Stair Management: One rail Right;Forwards Number of Stairs: 10 General stair comments: min guard for safety with ascent/descent of 10 steps, pt with 4/4 DoE at top of stairs requiring standing rest break, SaO2 on RA 93%O2      Balance Overall balance assessment: Mild deficits observed, not formally tested                                           Pertinent Vitals/Pain Pain Assessment: No/denies pain    Home Living Family/patient expects to be discharged to:: Private residence Living Arrangements: Alone Available Help at Discharge: Family;Available PRN/intermittently(children and grandson live nearby, grandson visits daily) Type of Home: House       Home Layout: Two level;Bed/bath upstairs Home Equipment: None      Prior Function Level of Independence: Independent         Comments: Pt reports independence for BADLs and driving, reports she does not complete IADLs. Pt reports husband has Alzheimer's, and is living in memory care facility. Reports not seeing him since March.     Hand Dominance   Dominant Hand: Right    Extremity/Trunk Assessment   Upper Extremity Assessment Upper Extremity Assessment: Defer to OT evaluation    Lower Extremity Assessment Lower Extremity Assessment: Overall WFL for tasks assessed    Cervical / Trunk Assessment Cervical / Trunk Assessment: Normal  Communication   Communication: No difficulties  Cognition Arousal/Alertness: Awake/alert Behavior During Therapy: [facility] for  tasks assessed/performed Overall Cognitive Status: No family/caregiver present to determine baseline cognitive functioning Area of Impairment: Attention;Following commands;Safety/judgement;Awareness;Problem solving                   Current Attention Level: Sustained   Following Commands: Follows one step commands inconsistently;Follows one step commands with increased  time Safety/Judgement: Decreased awareness of safety;Decreased awareness of deficits Awareness: Emergent Problem Solving: Slow processing;Decreased initiation;Difficulty sequencing;Requires verbal cues General Comments: Pt appeared with decreased attention and ability to follow commands. Required multiple VCs to problem solve to locate toothbrush and toothpaste. Pt appeared internally distracted and with tangential thought and speech throughout.       General Comments General comments (skin integrity, edema, etc.): SaO2 with ambulation and stair climb 90-93% O2        Assessment/Plan    PT Assessment Patient needs continued PT services  PT Problem List Cardiopulmonary status limiting activity;Decreased activity tolerance       PT Treatment Interventions DME instruction;Gait training;Stair training;Functional mobility training;Therapeutic activities;Therapeutic exercise;Balance training;Cognitive remediation;Patient/family education    PT Goals (Current goals can be found in the Care Plan section)  Acute Rehab PT Goals Patient Stated Goal: Go home PT Goal Formulation: With patient Time For Goal Achievement: 12/18/18 Potential to Achieve Goals: Good    Frequency Min 3X/week    AM-PAC PT "6 Clicks" Mobility  Outcome Measure Help needed turning from your back to your side while in a flat bed without using bedrails?: None Help needed moving from lying on your back to sitting on the side of a flat bed without using bedrails?: None Help needed moving to and from a bed to a chair (including a wheelchair)?: None Help needed standing up from a chair using your arms (e.g., wheelchair or bedside chair)?: None Help needed to walk in hospital room?: None Help needed climbing 3-5 steps with a railing? : None 6 Click Score: 24    End of Session   Activity Tolerance: Patient tolerated treatment well Patient left: in bed;with call bell/phone within reach Nurse Communication: Mobility  status PT Visit Diagnosis: Difficulty in walking, not elsewhere classified (R26.2)    Time: 7628-3151 PT Time Calculation (min) (ACUTE ONLY): 15 min   Charges:   PT Evaluation $PT Eval Moderate Complexity: 1 Mod          Kayani Rapaport B. Migdalia Dk PT, DPT Acute Rehabilitation Services Pager [phone] Office [phone]   [place] 12/04/2018, 12:20 PM

## 2018-12-04 NOTE — Discharge Instructions (Signed)
Ms.Haydel,  Thank you for trusting me with your care. To recap you were admitted for the following:  COPD exacerbation - We started oral prednisone, you will need to take this for 3 more days to finish the 5 day course - In addition we gave you breathing treatments, and your lungs sounded clear on day of discharge - You will need to follow up with a primary care provider to discuss possibly changing your inhalers. - You have not smoked tobacco in a few days and committed to quit for good. I am very proud and happy for you !  My best,  Tamsen Snider, MD

## 2018-12-04 NOTE — Discharge Summary (Signed)
Name: DYONNA JASPERS MRN: [id number] DOB: [birth year] 68 y.o. PCP: Patient, No Pcp Per  Date of Admission: 12/03/2018 10:53 AM Date of Discharge: 12/04/2018 Attending Physician: No att. providers found  Discharge Diagnosis: 1. COPD exacerbation  Discharge Medications: Allergies as of 12/04/2018      Reactions   Sulfa Antibiotics Hives      Medication List    STOP taking these medications   phenazopyridine 200 MG tablet Commonly known as: PYRIDIUM     TAKE these medications   albuterol 108 (90 Base) MCG/ACT inhaler Commonly known as: VENTOLIN HFA Inhale 1 puff into the lungs every 6 (six) hours as needed for wheezing or shortness of breath.   beclomethasone 80 MCG/ACT inhaler Commonly known as: Qvar RediHaler Inhale 1 puff into the lungs 2 (two) times daily.   citalopram 20 MG tablet Commonly known as: CELEXA Take 1 tablet (20 mg total) by mouth daily. What changed: when to take this Notes to patient: Next dose due 12/05/2018 in the morning   diazepam 10 MG tablet Commonly known as: VALIUM Take 1 tablet (10 mg total) by mouth every 6 (six) hours as needed for anxiety.   Fluticasone-Salmeterol 250-50 MCG/DOSE Aepb Commonly known as: Advair Diskus Inhale 1 puff into the lungs 2 (two) times daily.   metoprolol succinate 50 MG 24 hr tablet Commonly known as: TOPROL-XL TAKE 1 TABLET BY MOUTH DAILY WITH OR IMMEDIATELY FOLLOWING A MEAL What changed: See the new instructions. Notes to patient: Next dose due 12/05/2018 in the morning   naproxen sodium 220 MG tablet Commonly known as: ALEVE Take 220 mg by mouth daily as needed (pain).   predniSONE 20 MG tablet Commonly known as: DELTASONE Take 2 tablets (40 mg total) by mouth daily with breakfast for 3 days. Start taking on: December 05, 2018 Notes to patient: Next dose due 12/05/2018 in the morning       Disposition and follow-up:   Ms.Reizel L Lukins was discharged from [facility] in Stable  condition.  At the hospital follow up visit please address:  1.  COPD Exacerbation        - counsel on smoking cessation        - currently on advair, consider starting on LAMA/LABA        - Confirm patient finish 5 day course of prednisone 40 mg daily          2.  Labs / imaging needed at time of follow-up:   3.  Pending labs/ test needing follow-up:   Follow-up Appointments: Follow-up Information    [facility] Follow up in 1 week(s).   Contact information: [address] [place] [address] [phone]          Hospital Course by problem list: 1.   #COPD exacerbation  Patient came in with DOE for a week and found to have COPD exacerbation. PT not requiring supplemental O2. Breath sounds improved on exam after duonebs. Patient reports she has not tolerated breathing treatment at home in the past. Currently on Advair, but has not been following with PCP regularly. Will recommend LAMA/LABA to be discussed on hospital follow-up. Ambulated with pulse ox and >91% throughout.  Patient given prednisone 40 mg for 2 days in hospital and prescribed 3 days to finish up 5 day course.   #Dysruia  UA normal. Patient asymptomatic on discharge.   #MDD and anxiety -Continue Celexa  -Continue Diazepam   Discharge Vitals:   BP  106/83 (BP Location: Right Arm)   Pulse 64   Temp 97.8 F (36.6 C) (Oral)   Resp 16   SpO2 96%   Pertinent Labs, Studies, and Procedures:  CBC Latest Ref Rng & Units 12/03/2018 05/10/2018 09/13/2017  WBC 4.0 - 10.5 K/uL 7.7 9.5 7.2  Hemoglobin 12.0 - 15.0 g/dL 12.1 12.1 12.1  Hematocrit 36.0 - 46.0 % 39.0 38.6 37.7  Platelets 150 - 400 K/uL 218 261 237   CMP Latest Ref Rng & Units 12/03/2018 05/10/2018 09/13/2017  Glucose 70 - 99 mg/dL 96 122(H) 95  BUN 8 - 23 mg/dL 6(L) 12 14  Creatinine 0.44 - 1.00 mg/dL 0.87 0.76 0.72  Sodium 135 - 145 mmol/L 143 139 142  Potassium 3.5 - 5.1 mmol/L 4.1 3.5 3.7  Chloride 98 - 111 mmol/L 108  105 109  CO2 22 - 32 mmol/L 26 25 25   Calcium 8.9 - 10.3 mg/dL 9.0 8.9 9.2  Total Protein 6.5 - 8.1 g/dL 7.0 7.2 6.7  Total Bilirubin 0.3 - 1.2 mg/dL <0.1(L) 0.5 0.2(L)  Alkaline Phos 38 - 126 U/L 111 105 89  AST 15 - 41 U/L 24 22 18   ALT 0 - 44 U/L 15 14 12     Discharge Instructions: Discharge Instructions    Call MD for:  difficulty breathing, headache or visual disturbances   Complete by: As directed    Call MD for:  temperature >100.4   Complete by: As directed    Diet - low sodium heart healthy   Complete by: As directed    Increase activity slowly   Complete by: As directed       Signed:  Tamsen Snider, MD PGY1  [phone]

## 2018-12-04 NOTE — Progress Notes (Signed)
SATURATION QUALIFICATIONS: (This note is used to comply with regulatory documentation for home oxygen)  Patient Saturations on Room Air at Rest = 96%  Patient Saturations on Room Air while Ambulating = 94-91%  Please briefly explain why patient needs home oxygen: Pt performing functional mobility and ADLs on RA without SOB or fatigue. SpO2 >91% throughout.

## 2018-12-04 NOTE — Care Management (Signed)
CM spoke with pt at via phone .  Pt is independent from home, has PCP and denied barriers with paying for medications.  CM discussed disease management for her COPD, pt declined HHRN to help with management at home.  Pt denied all concerns and barriers with discharging home.  CM signing off

## 2018-12-04 NOTE — Progress Notes (Signed)
Discharge paperwork/instructions given to patient. New medication discussed (prednisone), medication sent to her preferred pharmacy. Home care discussed. Patient given information to find a PCP. Patient is waiting for her daughter to arrive at the main entrance for pick up. Patient denied needing wheelchair. RN will accompany patient to the main entrance when her daughter arrives. Roselyn Reef Juwana Thoreson,RN

## 2018-12-04 NOTE — Progress Notes (Signed)
Occupational Therapy Evaluation Patient Details Name: Jasmin Munoz MRN: [id number] DOB: 1950-11-25 Today's Date: 12/04/2018    History of Present Illness 68 y.o. female with history of COPD, fibromyalgia, macular degeneration, seizures, anxiety and depression, who presents to the ED with EMS for evaluation of shortness of breath, tachypnea with accessory muscle usage. Admitted 12/03/18 for treatment of COPD exacerbation.    Clinical Impression   PTA, pt lived alone in two story home and was independent for all BADLs and driving. Pt reports that daughter and grandson live nearby and visit daily. Pt currently presents with decreased activity tolerance, strength, safety, and awareness of deficits. Pt presented internally distracted and with tangential thought and speech, required mod VCs to problem solve through tasks and for following simple commands. Pt requires supervision for all BADLs; feel she is close to baseline function. Due to family support, recommend dc home with no OT follow up once medically stable per MD. All education and acute needs met, will sign off.     Follow Up Recommendations  No OT follow up;Supervision - Intermittent    Equipment Recommendations  None recommended by OT    Recommendations for Other Services PT consult     Precautions / Restrictions        Mobility Bed Mobility Overal bed mobility: Modified Independent             General bed mobility comments: required increased time  Transfers Overall transfer level: Needs assistance Equipment used: None Transfers: Sit to/from Stand Sit to Stand: Supervision         General transfer comment: supervision for safety and balance    Balance Overall balance assessment: Mild deficits observed, not formally tested                                         ADL either performed or assessed with clinical judgement   ADL Overall ADL's : Needs assistance/impaired Eating/Feeding:  Independent;Sitting   Grooming: Wash/dry face;Oral care;Supervision/safety;Cueing for sequencing;Standing Grooming Details (indicate cue type and reason): Pt required multiple VCs to locate toothbrush and toothpaste to initiate task. Upper Body Bathing: Supervision/ safety;Sitting   Lower Body Bathing: Supervison/ safety;Sit to/from stand   Upper Body Dressing : Supervision/safety;Sitting   Lower Body Dressing: Supervision/safety;Sit to/from stand Lower Body Dressing Details (indicate cue type and reason): Pt donned socks sitting EOB with supervision for safety Toilet Transfer: Supervision/safety;Ambulation Toilet Transfer Details (indicate cue type and reason): simulated to recliner. Pt required supervision for safety and balance Toileting- Clothing Manipulation and Hygiene: Supervision/safety;Sit to/from stand       Functional mobility during ADLs: Supervision/safety General ADL Comments: Pt required supervision for safety throughout. Required VCs to attend to task and problem solve to locate items required for oral care.      Vision Baseline Vision/History: Wears glasses Wears Glasses: At all times Patient Visual Report: No change from baseline       Perception     Praxis      Pertinent Vitals/Pain Pain Assessment: No/denies pain     Hand Dominance Right   Extremity/Trunk Assessment Upper Extremity Assessment Upper Extremity Assessment: Overall WFL for tasks assessed   Lower Extremity Assessment Lower Extremity Assessment: Defer to PT evaluation   Cervical / Trunk Assessment Cervical / Trunk Assessment: Normal   Communication Communication Communication: No difficulties   Cognition Arousal/Alertness: Awake/alert Behavior During Therapy: WFL for tasks assessed/performed Overall  Cognitive Status: No family/caregiver present to determine baseline cognitive functioning Area of Impairment: Attention;Following commands;Safety/judgement;Awareness;Problem solving                    Current Attention Level: Sustained   Following Commands: Follows one step commands inconsistently;Follows one step commands with increased time Safety/Judgement: Decreased awareness of safety;Decreased awareness of deficits Awareness: Emergent Problem Solving: Slow processing;Decreased initiation;Difficulty sequencing;Requires verbal cues General Comments: Pt appeared with decreased attention and ability to follow commands. Required multiple VCs to problem solve to locate toothbrush and toothpaste. Pt appeared internally distracted and with tangential thought and speech throughout.    General Comments  Pt's HR in 60s-70s, was stable throughout. On RA, pt SpO2 sitting in bed was 96, standing at sink was 94, and ambulating was 94-91. Pt SpO2 at 95 on RA sitting in recliner at end of session.     Exercises     Shoulder Instructions      Home Living Family/patient expects to be discharged to:: Private residence Living Arrangements: Alone Available Help at Discharge: Family;Available PRN/intermittently(children and grandson live nearby, grandson visits daily) Type of Home: House       Home Layout: Two level;Bed/bath upstairs Alternate Level Stairs-Number of Steps: flight   Bathroom Shower/Tub: Occupational psychologist: Standard     Home Equipment: None          Prior Functioning/Environment Level of Independence: Independent        Comments: Pt reports independence for BADLs and driving, reports she does not complete IADLs. Pt reports husband has Alzheimer's, and is living in memory care facility. Reports not seeing him since March.        OT Problem List: Decreased strength;Decreased range of motion;Decreased activity tolerance;Impaired balance (sitting and/or standing);Decreased safety awareness;Cardiopulmonary status limiting activity      OT Treatment/Interventions:      OT Goals(Current goals can be found in the care plan section)  Acute Rehab OT Goals Patient Stated Goal: Go home OT Goal Formulation: All assessment and education complete, DC therapy  OT Frequency:     Barriers to D/C:            Co-evaluation              AM-PAC OT "6 Clicks" Daily Activity     Outcome Measure Help from another person eating meals?: None Help from another person taking care of personal grooming?: None Help from another person toileting, which includes using toliet, bedpan, or urinal?: None Help from another person bathing (including washing, rinsing, drying)?: None Help from another person to put on and taking off regular upper body clothing?: None Help from another person to put on and taking off regular lower body clothing?: None 6 Click Score: 24   End of Session Equipment Utilized During Treatment: Gait belt Nurse Communication: Mobility status  Activity Tolerance: Patient tolerated treatment well Patient left: in chair;with call bell/phone within reach  OT Visit Diagnosis: Muscle weakness (generalized) (M62.81);Unsteadiness on feet (R26.81)                Time: 9485-4627 OT Time Calculation (min): 19 min Charges:  OT General Charges $OT Visit: 1 Visit OT Evaluation $OT Eval Low Complexity: [place], OT Student  Gus Rankin 12/04/2018, 9:56 AM

## 2018-12-04 NOTE — Progress Notes (Addendum)
   Subjective: Patient reports her breathing has improved overnight with treatment.  She denies any dysuria or bladder pressure.  Objective:  Vital signs in last 24 hours: Vitals:   12/03/18 1824 12/03/18 1900 12/03/18 2230 12/04/18 0514  BP: (!) 119/95  110/88 97/61  Pulse: 71 75 75 63  Resp: 15 16 16 20   Temp: 98 F (36.7 C)  98 F (36.7 C) (!) 97.5 F (36.4 C)  TempSrc: Oral  Oral Oral  SpO2: 97% 96% 97% 97%   Physical Exam  Constitutional: No distress.  Frail appearing   Cardiovascular: Normal rate. Exam reveals no gallop and no friction rub.  No murmur heard. Pulmonary/Chest: Effort normal. She has no wheezes. She has no rales. She exhibits no tenderness.  Abdominal: Soft. Bowel sounds are normal.  Skin: Skin is warm and dry.     Assessment/Plan:  Active Problems:   COPD exacerbation (HCC)  Lauralyn Shadowens is a 68 year old female with history of COPD hypertension anxiety and depression who presents with COPD exacerbation.  #COPD exacerbation  Breath sounds improved on exam. Patient reports she has not tolerated breathing treatment at home in the past. Currently on Advair, but has not been following with PCP regularly. Will recommend LAMA/LABA to be discussed on hospital follow-up.  - Prednisone 40 mg Day 2/5 - Duonebs q6hrs - ambulate with pulse ox - OT/PT  #Dysruia  UA normal. Patient asymptomatic today.   #MDD and anxiety -Continue Celexa  -Continue Diazepam   Dispo: Anticipated discharge today.  Tamsen Snider, MD PGY1  [phone]

## 2018-12-08 LAB — CULTURE, BLOOD (ROUTINE X 2)
Culture: NO GROWTH
Special Requests: ADEQUATE

## 2018-12-09 LAB — CULTURE, BLOOD (ROUTINE X 2)
Culture: NO GROWTH
Special Requests: ADEQUATE

## 2019-01-15 ENCOUNTER — Ambulatory Visit (INDEPENDENT_AMBULATORY_CARE_PROVIDER_SITE_OTHER): Payer: Medicare Other | Admitting: Family Medicine

## 2019-01-15 ENCOUNTER — Other Ambulatory Visit: Payer: Self-pay

## 2019-01-15 ENCOUNTER — Encounter: Payer: Self-pay | Admitting: Family Medicine

## 2019-01-15 VITALS — BP 125/86 | HR 116 | Temp 97.6°F | Ht 62.0 in | Wt 111.2 lb

## 2019-01-15 DIAGNOSIS — E785 Hyperlipidemia, unspecified: Secondary | ICD-10-CM | POA: Diagnosis not present

## 2019-01-15 DIAGNOSIS — I10 Essential (primary) hypertension: Secondary | ICD-10-CM

## 2019-01-15 DIAGNOSIS — J449 Chronic obstructive pulmonary disease, unspecified: Secondary | ICD-10-CM | POA: Diagnosis not present

## 2019-01-15 LAB — COMPREHENSIVE METABOLIC PANEL WITH GFR
ALT: 11 IU/L (ref 0–32)
AST: 19 IU/L (ref 0–40)
Albumin/Globulin Ratio: 2 (ref 1.2–2.2)
Albumin: 4.2 g/dL (ref 3.8–4.8)
Alkaline Phosphatase: 124 IU/L — ABNORMAL HIGH (ref 39–117)
BUN/Creatinine Ratio: 14 (ref 12–28)
BUN: 12 mg/dL (ref 8–27)
Bilirubin Total: 0.2 mg/dL (ref 0.0–1.2)
CO2: 23 mmol/L (ref 20–29)
Calcium: 8.9 mg/dL (ref 8.7–10.3)
Chloride: 104 mmol/L (ref 96–106)
Creatinine, Ser: 0.84 mg/dL (ref 0.57–1.00)
GFR calc Af Amer: 83 mL/min/1.73 (ref >59)
GFR calc non Af Amer: 72 mL/min/1.73 (ref >59)
Globulin, Total: 2.1 g/dL (ref 1.5–4.5)
Glucose: 115 mg/dL — ABNORMAL HIGH (ref 65–99)
Potassium: 3.9 mmol/L (ref 3.5–5.2)
Sodium: 141 mmol/L (ref 134–144)
Total Protein: 6.3 g/dL (ref 6.0–8.5)

## 2019-01-15 LAB — LIPID PANEL
Chol/HDL Ratio: 2.3 ratio (ref 0.0–4.4)
Cholesterol, Total: 174 mg/dL (ref 100–199)
HDL: 77 mg/dL (ref >39)
LDL Chol Calc (NIH): 75 mg/dL (ref 0–99)
Triglycerides: 128 mg/dL (ref 0–149)
VLDL Cholesterol Cal: 22 mg/dL (ref 5–40)

## 2019-01-15 MED ORDER — QVAR REDIHALER 80 MCG/ACT IN AERB: 2.0000 | INHALATION_SPRAY | Freq: Two times a day (BID) | RESPIRATORY_TRACT | 3 refills | Status: DC

## 2019-01-15 MED ORDER — ALBUTEROL SULFATE HFA 108 (90 BASE) MCG/ACT IN AERS: 1.0000 | INHALATION_SPRAY | Freq: Four times a day (QID) | RESPIRATORY_TRACT | 3 refills | Status: DC | PRN

## 2019-01-15 MED ORDER — AMLODIPINE BESYLATE 2.5 MG PO TABS: 2.5000 mg | ORAL_TABLET | Freq: Every day | ORAL | 1 refills | Status: DC

## 2019-01-15 NOTE — Patient Instructions (Addendum)
Increase Qvar to twice per day.  If that is not lessening use of albuterol, we may look at restarting advair or similar med. Let me know. Recheck in next 6 weeks.   If blood pressure over 130/80 - start amlodipine once per day. I will check some labs today.   Follow up in 6 weeks. Take care.   Return to the clinic or go to the nearest emergency room if any of your symptoms worsen or new symptoms occur.   Managing Your Hypertension Hypertension is commonly called high blood pressure. This is when the force of your blood pressing against the walls of your arteries is too strong. Arteries are blood vessels that carry blood from your heart throughout your body. Hypertension forces the heart to work harder to pump blood, and may cause the arteries to become narrow or stiff. Having untreated or uncontrolled hypertension can cause heart attack, stroke, kidney disease, and other problems. What are blood pressure readings? A blood pressure reading consists of a higher number over a lower number. Ideally, your blood pressure should be below 120/80. The first ("top") number is called the systolic pressure. It is a measure of the pressure in your arteries as your heart beats. The second ("bottom") number is called the diastolic pressure. It is a measure of the pressure in your arteries as the heart relaxes. What does my blood pressure reading mean? Blood pressure is classified into four stages. Based on your blood pressure reading, your health care provider may use the following stages to determine what type of treatment you need, if any. Systolic pressure and diastolic pressure are measured in a unit called mm Hg. Normal  Systolic pressure: below 120.  Diastolic pressure: below 80. Elevated  Systolic pressure: 120-129.  Diastolic pressure: below 80. Hypertension stage 1  Systolic pressure: 130-139.  Diastolic pressure: 80-89. Hypertension stage 2  Systolic pressure: 140 or above.  Diastolic  pressure: 90 or above. What health risks are associated with hypertension? Managing your hypertension is an important responsibility. Uncontrolled hypertension can lead to:  A heart attack.  A stroke.  A weakened blood vessel (aneurysm).  Heart failure.  Kidney damage.  Eye damage.  Metabolic syndrome.  Memory and concentration problems. What changes can I make to manage my hypertension? Hypertension can be managed by making lifestyle changes and possibly by taking medicines. Your health care provider will help you make a plan to bring your blood pressure within a normal range. Eating and drinking   Eat a diet that is high in fiber and potassium, and low in salt (sodium), added sugar, and fat. An example eating plan is called the DASH (Dietary Approaches to Stop Hypertension) diet. To eat this way: ? Eat plenty of fresh fruits and vegetables. Try to fill half of your plate at each meal with fruits and vegetables. ? Eat whole grains, such as whole wheat pasta, brown rice, or whole grain bread. Fill about one quarter of your plate with whole grains. ? Eat low-fat diary products. ? Avoid fatty cuts of meat, processed or cured meats, and poultry with skin. Fill about one quarter of your plate with lean proteins such as fish, chicken without skin, beans, eggs, and tofu. ? Avoid premade and processed foods. These tend to be higher in sodium, added sugar, and fat.  Reduce your daily sodium intake. Most people with hypertension should eat less than 1,500 mg of sodium a day.  Limit alcohol intake to no more than 1 drink a day  for nonpregnant women and 2 drinks a day for men. One drink equals 12 oz of beer, 5 oz of wine, or 1 oz of hard liquor. Lifestyle  Work with your health care provider to maintain a healthy body weight, or to lose weight. Ask what an ideal weight is for you.  Get at least 30 minutes of exercise that causes your heart to beat faster (aerobic exercise) most days of  the week. Activities may include walking, swimming, or biking.  Include exercise to strengthen your muscles (resistance exercise), such as weight lifting, as part of your weekly exercise routine. Try to do these types of exercises for 30 minutes at least 3 days a week.  Do not use any products that contain nicotine or tobacco, such as cigarettes and e-cigarettes. If you need help quitting, ask your health care provider.  Control any long-term (chronic) conditions you have, such as high cholesterol or diabetes. Monitoring  Monitor your blood pressure at home as told by your health care provider. Your personal target blood pressure may vary depending on your medical conditions, your age, and other factors.  Have your blood pressure checked regularly, as often as told by your health care provider. Working with your health care provider  Review all the medicines you take with your health care provider because there may be side effects or interactions.  Talk with your health care provider about your diet, exercise habits, and other lifestyle factors that may be contributing to hypertension.  Visit your health care provider regularly. Your health care provider can help you create and adjust your plan for managing hypertension. Will I need medicine to control my blood pressure? Your health care provider may prescribe medicine if lifestyle changes are not enough to get your blood pressure under control, and if:  Your systolic blood pressure is 130 or higher.  Your diastolic blood pressure is 80 or higher. Take medicines only as told by your health care provider. Follow the directions carefully. Blood pressure medicines must be taken as prescribed. The medicine does not work as well when you skip doses. Skipping doses also puts you at risk for problems. Contact a health care provider if:  You think you are having a reaction to medicines you have taken.  You have repeated (recurrent)  headaches.  You feel dizzy.  You have swelling in your ankles.  You have trouble with your vision. Get help right away if:  You develop a severe headache or confusion.  You have unusual weakness or numbness, or you feel faint.  You have severe pain in your chest or abdomen.  You vomit repeatedly.  You have trouble breathing. Summary  Hypertension is when the force of blood pumping through your arteries is too strong. If this condition is not controlled, it may put you at risk for serious complications.  Your personal target blood pressure may vary depending on your medical conditions, your age, and other factors. For most people, a normal blood pressure is less than 120/80.  Hypertension is managed by lifestyle changes, medicines, or both. Lifestyle changes include weight loss, eating a healthy, low-sodium diet, exercising more, and limiting alcohol. This information is not intended to replace advice given to you by your health care provider. Make sure you discuss any questions you have with your health care provider. Document Released: 10/13/2011 Document Revised: 05/12/2018 Document Reviewed: 12/17/2015 Elsevier Patient Education  [employer]2020 [employer]   If you have lab work done today you will be contacted with your  lab results within the next 2 weeks.  If you have not heard from [place] then please contact us. The fastest way to get your results is to register for My Chart.   IF you received an x-ray today, you will receive an invoice from [facility]. Please contact [facility] at [phone] with questions or concerns regarding your invoice.   IF you received labwork today, you will receive an invoice from [place]. Please contact [facility] at [phone] with questions or concerns regarding your invoice.   Our billing staff will not be able to assist you with questions regarding bills from these companies.  You will be contacted with the lab results as soon  as they are available. The fastest way to get your results is to activate your My Chart account. Instructions are located on the last page of this paperwork. If you have not heard from [place] regarding the results in 2 weeks, please contact this office.

## 2019-01-15 NOTE — Progress Notes (Signed)
Subjective:  Patient ID: Jasmin Munoz, female    DOB: 1950/11/26  Age: 68 y.o. MRN: [id number]  CC:  Chief Complaint  Patient presents with  . Blood Pressure Check    Refill medication    HPI Jasmin Munoz presents for   Medication refill.  Previous patient of Jasmin Munoz. Husband with alzheimers. Asymptomatic, but recently diagnosed with covid. Worried he will pass.   Hypertension: Toprol-XL 50 mg daily. No heart palpitations or other reason for betablocker.  Out for 2 weeks.  Home readings on meds 120/80.  BP Readings from Last 3 Encounters:  01/15/19 125/86  12/04/18 106/83  05/11/18 121/86   Lab Results  Component Value Date   CREATININE 0.87 12/03/2018    Hyperlipidemia: Mild elevation prior. No meds.  Lab Results  Component Value Date   CHOL 201 (H) 09/14/2017   HDL 53 09/14/2017   LDLCALC 123 (H) 09/14/2017   TRIG 123 09/14/2017   CHOLHDL 3.8 09/14/2017   Lab Results  Component Value Date   ALT 15 12/03/2018   AST 24 12/03/2018   ALKPHOS 111 12/03/2018   BILITOT <0.1 (L) 12/03/2018     COPD Hospital admission in November with COPD flare.  Currently on Albuterol rescue - using daily 2-3 times. Winded at night -past year or two. No pulmonologist. On Qvar BID, felt like advair worked better - too costly.    Depression: Celexa 20 mg daily.  Diazepam 10 mg as needed, followed by Dr. Len Blalock.has therapist as well through Hospice.   Depression screen [facility] 2/9 01/15/2019 09/19/2017 08/17/2017 10/28/2016 07/26/2016  Decreased Interest 0 0 0 3 3  Down, Depressed, Hopeless 0 0 0 3 3  PHQ - 2 Score 0 0 0 6 6  Altered sleeping - - - 1 3  Tired, decreased energy - - - 3 3  Change in appetite - - - 3 3  Feeling bad or failure about yourself  - - - 3 0  Trouble concentrating - - - 0 2  Moving slowly or fidgety/restless - - - 0 0  Suicidal thoughts - - - 3 0  PHQ-9 Score - - - 19 17  Difficult doing work/chores - - - - Very difficult    History  Patient Active Problem List   Diagnosis Date Noted  . MDD (major depressive disorder), recurrent episode, severe (HCC) 09/13/2017  . COPD (chronic obstructive pulmonary disease) (HCC) 02/03/2013  . COPD exacerbation (HCC) 02/03/2013  . Hypoxemia 02/03/2013  . Generalized anxiety disorder 02/03/2013  . HTN (hypertension) 02/03/2013  . Nonspecific abnormal electrocardiogram (ECG) (EKG) 02/03/2013  . DEPRESSION 05/24/2006  . TENDINITIS 05/24/2006   Past Medical History:  Diagnosis Date  . Anxiety   . Arthritis   . Cataract   . COPD (chronic obstructive pulmonary disease) (HCC)   . Depression   . Fibromyalgia   . Macular degeneration   . Seizures (HCC)    Past Surgical History:  Procedure Laterality Date  . ABDOMINAL HYSTERECTOMY     Allergies  Allergen Reactions  . Sulfa Antibiotics Hives   Prior to Admission medications   Medication Sig Start Date End Date Taking? Authorizing Provider  albuterol (PROVENTIL HFA;VENTOLIN HFA) 108 (90 Base) MCG/ACT inhaler Inhale 1 puff into the lungs every 6 (six) hours as needed for wheezing or shortness of breath. 09/19/17   McVey, Madelaine Bhat, PA-C  beclomethasone (QVAR REDIHALER) 80 MCG/ACT inhaler Inhale 1 puff into the lungs 2 (two) times daily. 08/17/17  Tereasa Coop, PA-C  citalopram (CELEXA) 20 MG tablet Take 1 tablet (20 mg total) by mouth daily. Patient taking differently: Take 20 mg by mouth every evening.  09/16/17   Mordecai Maes, NP  diazepam (VALIUM) 10 MG tablet Take 1 tablet (10 mg total) by mouth every 6 (six) hours as needed for anxiety. 05/23/13   Robyn Haber, MD  Fluticasone-Salmeterol (ADVAIR DISKUS) 250-50 MCG/DOSE AEPB Inhale 1 puff into the lungs 2 (two) times daily. 09/19/17   McVey, Gelene Mink, PA-C  metoprolol succinate (TOPROL-XL) 50 MG 24 hr tablet TAKE 1 TABLET BY MOUTH DAILY WITH OR IMMEDIATELY FOLLOWING A MEAL Patient taking differently: Take 50 mg by mouth every evening.  07/12/18    Forrest Moron, MD  naproxen sodium (ALEVE) 220 MG tablet Take 220 mg by mouth daily as needed (pain).     [provider]   Social History   Socioeconomic History  . Marital status: Married    Spouse name: Not on file  . Number of children: Not on file  . Years of education: Not on file  . Highest education level: Not on file  Occupational History  . Not on file  Tobacco Use  . Smoking status: Current Every Day Smoker    Packs/day: 1.00    Years: 35.00    Pack years: 35.00    Types: Cigarettes    Last attempt to quit: 01/27/2013    Years since quitting: 5.9  . Smokeless tobacco: Current User  Substance and Sexual Activity  . Alcohol use: No  . Drug use: No  . Sexual activity: Not Currently  Other Topics Concern  . Not on file  Social History Narrative  . Not on file   Social Determinants of Health   Financial Resource Strain:   . Difficulty of Paying Living Expenses: Not on file  Food Insecurity:   . Worried About Charity fundraiser in the Last Year: Not on file  . Ran Out of Food in the Last Year: Not on file  Transportation Needs:   . Lack of Transportation (Medical): Not on file  . Lack of Transportation (Non-Medical): Not on file  Physical Activity:   . Days of Exercise per Week: Not on file  . Minutes of Exercise per Session: Not on file  Stress:   . Feeling of Stress : Not on file  Social Connections:   . Frequency of Communication with Friends and Family: Not on file  . Frequency of Social Gatherings with Friends and Family: Not on file  . Attends Religious Services: Not on file  . Active Member of Clubs or Organizations: Not on file  . Attends Archivist Meetings: Not on file  . Marital Status: Not on file  Intimate Partner Violence:   . Fear of Current or Ex-Partner: Not on file  . Emotionally Abused: Not on file  . Physically Abused: Not on file  . Sexually Abused: Not on file    Review of Systems  Constitutional: Negative  for fatigue and unexpected weight change.  Respiratory: Positive for shortness of breath. Negative for chest tightness.   Cardiovascular: Negative for chest pain, palpitations and leg swelling.  Gastrointestinal: Negative for abdominal pain and blood in stool.  Neurological: Positive for headaches (last night, not usually. ). Negative for dizziness, syncope and light-headedness.     Objective:   Vitals:   01/15/19 0807  BP: 125/86  Pulse: (!) 116  Temp: 97.6 F (36.4 C)  TempSrc:  Temporal  SpO2: 97%  Weight: 111 lb 3.2 oz (50.4 kg)  Height: 5\' 2"  (1.575 m)     Physical Exam Vitals reviewed.  Constitutional:      Appearance: She is well-developed.  HENT:     Head: Normocephalic and atraumatic.  Eyes:     Conjunctiva/sclera: Conjunctivae normal.     Pupils: Pupils are equal, round, and reactive to light.  Neck:     Vascular: No carotid bruit.  Cardiovascular:     Rate and Rhythm: Normal rate and regular rhythm.     Heart sounds: Normal heart sounds.  Pulmonary:     Effort: Pulmonary effort is normal.     Breath sounds: Normal breath sounds.  Abdominal:     Palpations: Abdomen is soft. There is no pulsatile mass.     Tenderness: There is no abdominal tenderness.  Skin:    General: Skin is warm and dry.  Neurological:     Mental Status: She is alert and oriented to person, place, and time.  Psychiatric:        Behavior: Behavior normal.        Assessment & Plan:  Jasmin NayLinda L Muscato is a 68 y.o. female . Essential hypertension - Plan: Comprehensive metabolic panel, amLODipine (NORVASC) 2.5 MG tablet  - borderline. May not need meds.  - home monitoring, then if over 130/80 - start low dose amlodipine.  Stopped betablocker with COPD and albuterol need.  COPD, moderate (HCC) - Plan: beclomethasone (QVAR REDIHALER) 80 MCG/ACT inhaler, albuterol (VENTOLIN HFA) 108 (90 Base) MCG/ACT inhaler  - decreased control. For now increase Qvar to 2 puffs BID. Consider  advair/symbicort if continued albuterol need. Recheck 6 weeks to discuss regimen further.   Hyperlipidemia, unspecified hyperlipidemia type - Plan: Lipid panel  - mild prior - check labs.   6 week follow up.    Meds ordered this encounter  Medications  . amLODipine (NORVASC) 2.5 MG tablet    Sig: Take 1 tablet (2.5 mg total) by mouth daily.    Dispense:  90 tablet    Refill:  1  . beclomethasone (QVAR REDIHALER) 80 MCG/ACT inhaler    Sig: Inhale 2 puffs into the lungs 2 (two) times daily.    Dispense:  10.6 g    Refill:  3  . albuterol (VENTOLIN HFA) 108 (90 Base) MCG/ACT inhaler    Sig: Inhale 1 puff into the lungs every 6 (six) hours as needed for wheezing or shortness of breath.    Dispense:  6.7 g    Refill:  3   Patient Instructions    Increase Qvar to twice per day.  If that is not lessening use of albuterol, we may look at restarting advair or similar med. Let me know. Recheck in next 6 weeks.   If blood pressure over 130/80 - start amlodipine once per day. I will check some labs today.   Follow up in 6 weeks. Take care.   Return to the clinic or go to the nearest emergency room if any of your symptoms worsen or new symptoms occur.   Managing Your Hypertension Hypertension is commonly called high blood pressure. This is when the force of your blood pressing against the walls of your arteries is too strong. Arteries are blood vessels that carry blood from your heart throughout your body. Hypertension forces the heart to work harder to pump blood, and may cause the arteries to become narrow or stiff. Having untreated or uncontrolled hypertension can cause heart  attack, stroke, kidney disease, and other problems. What are blood pressure readings? A blood pressure reading consists of a higher number over a lower number. Ideally, your blood pressure should be below 120/80. The first ("top") number is called the systolic pressure. It is a measure of the pressure in your  arteries as your heart beats. The second ("bottom") number is called the diastolic pressure. It is a measure of the pressure in your arteries as the heart relaxes. What does my blood pressure reading mean? Blood pressure is classified into four stages. Based on your blood pressure reading, your health care provider may use the following stages to determine what type of treatment you need, if any. Systolic pressure and diastolic pressure are measured in a unit called mm Hg. Normal  Systolic pressure: below 120.  Diastolic pressure: below 80. Elevated  Systolic pressure: 120-129.  Diastolic pressure: below 80. Hypertension stage 1  Systolic pressure: 130-139.  Diastolic pressure: 80-89. Hypertension stage 2  Systolic pressure: 140 or above.  Diastolic pressure: 90 or above. What health risks are associated with hypertension? Managing your hypertension is an important responsibility. Uncontrolled hypertension can lead to:  A heart attack.  A stroke.  A weakened blood vessel (aneurysm).  Heart failure.  Kidney damage.  Eye damage.  Metabolic syndrome.  Memory and concentration problems. What changes can I make to manage my hypertension? Hypertension can be managed by making lifestyle changes and possibly by taking medicines. Your health care provider will help you make a plan to bring your blood pressure within a normal range. Eating and drinking   Eat a diet that is high in fiber and potassium, and low in salt (sodium), added sugar, and fat. An example eating plan is called the DASH (Dietary Approaches to Stop Hypertension) diet. To eat this way: ? Eat plenty of fresh fruits and vegetables. Try to fill half of your plate at each meal with fruits and vegetables. ? Eat whole grains, such as whole wheat pasta, brown rice, or whole grain bread. Fill about one quarter of your plate with whole grains. ? Eat low-fat diary products. ? Avoid fatty cuts of meat, processed or  cured meats, and poultry with skin. Fill about one quarter of your plate with lean proteins such as fish, chicken without skin, beans, eggs, and tofu. ? Avoid premade and processed foods. These tend to be higher in sodium, added sugar, and fat.  Reduce your daily sodium intake. Most people with hypertension should eat less than 1,500 mg of sodium a day.  Limit alcohol intake to no more than 1 drink a day for nonpregnant women and 2 drinks a day for men. One drink equals 12 oz of beer, 5 oz of wine, or 1 oz of hard liquor. Lifestyle  Work with your health care provider to maintain a healthy body weight, or to lose weight. Ask what an ideal weight is for you.  Get at least 30 minutes of exercise that causes your heart to beat faster (aerobic exercise) most days of the week. Activities may include walking, swimming, or biking.  Include exercise to strengthen your muscles (resistance exercise), such as weight lifting, as part of your weekly exercise routine. Try to do these types of exercises for 30 minutes at least 3 days a week.  Do not use any products that contain nicotine or tobacco, such as cigarettes and e-cigarettes. If you need help quitting, ask your health care provider.  Control any long-term (chronic) conditions you have, such  as high cholesterol or diabetes. Monitoring  Monitor your blood pressure at home as told by your health care provider. Your personal target blood pressure may vary depending on your medical conditions, your age, and other factors.  Have your blood pressure checked regularly, as often as told by your health care provider. Working with your health care provider  Review all the medicines you take with your health care provider because there may be side effects or interactions.  Talk with your health care provider about your diet, exercise habits, and other lifestyle factors that may be contributing to hypertension.  Visit your health care provider regularly.  Your health care provider can help you create and adjust your plan for managing hypertension. Will I need medicine to control my blood pressure? Your health care provider may prescribe medicine if lifestyle changes are not enough to get your blood pressure under control, and if:  Your systolic blood pressure is 130 or higher.  Your diastolic blood pressure is 80 or higher. Take medicines only as told by your health care provider. Follow the directions carefully. Blood pressure medicines must be taken as prescribed. The medicine does not work as well when you skip doses. Skipping doses also puts you at risk for problems. Contact a health care provider if:  You think you are having a reaction to medicines you have taken.  You have repeated (recurrent) headaches.  You feel dizzy.  You have swelling in your ankles.  You have trouble with your vision. Get help right away if:  You develop a severe headache or confusion.  You have unusual weakness or numbness, or you feel faint.  You have severe pain in your chest or abdomen.  You vomit repeatedly.  You have trouble breathing. Summary  Hypertension is when the force of blood pumping through your arteries is too strong. If this condition is not controlled, it may put you at risk for serious complications.  Your personal target blood pressure may vary depending on your medical conditions, your age, and other factors. For most people, a normal blood pressure is less than 120/80.  Hypertension is managed by lifestyle changes, medicines, or both. Lifestyle changes include weight loss, eating a healthy, low-sodium diet, exercising more, and limiting alcohol. This information is not intended to replace advice given to you by your health care provider. Make sure you discuss any questions you have with your health care provider. Document Released: 10/13/2011 Document Revised: 05/12/2018 Document Reviewed: 12/17/2015 Elsevier Patient Education   [employer].   If you have lab work done today you will be contacted with your lab results within the next 2 weeks.  If you have not heard from [place] then please contact us. The fastest way to get your results is to register for My Chart.   IF you received an x-ray today, you will receive an invoice from [facility]. Please contact [facility] at [phone] with questions or concerns regarding your invoice.   IF you received labwork today, you will receive an invoice from [facility]. Please contact [facility] at [phone] with questions or concerns regarding your invoice.   Our billing staff will not be able to assist you with questions regarding bills from these companies.  You will be contacted with the lab results as soon as they are available. The fastest way to get your results is to activate your My Chart account. Instructions are located on the last page of this paperwork. If you have not heard from [place] regarding  the results in 2 weeks, please contact this office.         Signed, Merri Ray, MD [facility]

## 2019-05-14 ENCOUNTER — Telehealth: Payer: Self-pay | Admitting: *Deleted

## 2019-05-14 NOTE — Telephone Encounter (Signed)
Schedule AWV.  

## 2019-06-25 DIAGNOSIS — R3915 Urgency of urination: Secondary | ICD-10-CM | POA: Diagnosis not present

## 2019-06-25 DIAGNOSIS — N3020 Other chronic cystitis without hematuria: Secondary | ICD-10-CM | POA: Diagnosis not present

## 2019-06-25 DIAGNOSIS — N3001 Acute cystitis with hematuria: Secondary | ICD-10-CM | POA: Diagnosis not present

## 2019-06-25 DIAGNOSIS — R3914 Feeling of incomplete bladder emptying: Secondary | ICD-10-CM | POA: Diagnosis not present

## 2019-06-25 DIAGNOSIS — R3911 Hesitancy of micturition: Secondary | ICD-10-CM | POA: Diagnosis not present

## 2019-06-29 ENCOUNTER — Encounter (HOSPITAL_COMMUNITY): Payer: Self-pay | Admitting: Emergency Medicine

## 2019-06-29 ENCOUNTER — Other Ambulatory Visit: Payer: Self-pay

## 2019-06-29 DIAGNOSIS — J449 Chronic obstructive pulmonary disease, unspecified: Secondary | ICD-10-CM | POA: Diagnosis not present

## 2019-06-29 DIAGNOSIS — I10 Essential (primary) hypertension: Secondary | ICD-10-CM | POA: Diagnosis not present

## 2019-06-29 DIAGNOSIS — N390 Urinary tract infection, site not specified: Secondary | ICD-10-CM | POA: Insufficient documentation

## 2019-06-29 DIAGNOSIS — F17210 Nicotine dependence, cigarettes, uncomplicated: Secondary | ICD-10-CM | POA: Diagnosis not present

## 2019-06-29 DIAGNOSIS — Z79899 Other long term (current) drug therapy: Secondary | ICD-10-CM | POA: Insufficient documentation

## 2019-06-29 DIAGNOSIS — R300 Dysuria: Secondary | ICD-10-CM | POA: Diagnosis present

## 2019-06-29 DIAGNOSIS — R52 Pain, unspecified: Secondary | ICD-10-CM | POA: Diagnosis not present

## 2019-06-29 NOTE — ED Triage Notes (Signed)
Pt from home has had painful urination last 6 months see at PMD this past week no UTI was found. Pt given  appointment with urologist coming soon but pain worsened and she came to ED tonight.

## 2019-06-30 ENCOUNTER — Emergency Department (HOSPITAL_COMMUNITY)
Admission: EM | Admit: 2019-06-30 | Discharge: 2019-06-30 | Disposition: A | Discharge status: Home / Self Care | Payer: Medicare Other | Attending: Emergency Medicine | Admitting: Emergency Medicine

## 2019-06-30 DIAGNOSIS — N390 Urinary tract infection, site not specified: Secondary | ICD-10-CM

## 2019-06-30 LAB — URINALYSIS, ROUTINE W REFLEX MICROSCOPIC
Bacteria, UA: NONE SEEN
Bilirubin Urine: NEGATIVE
Glucose, UA: NEGATIVE mg/dL
Ketones, ur: NEGATIVE mg/dL
Nitrite: POSITIVE — AB
Protein, ur: 30 mg/dL — AB
Specific Gravity, Urine: 1.011 (ref 1.005–1.030)
WBC, UA: >50 WBC/hpf — ABNORMAL HIGH (ref 0–5)
pH: 7 (ref 5.0–8.0)

## 2019-06-30 LAB — CBC WITH DIFFERENTIAL/PLATELET
Abs Immature Granulocytes: 0.03 10*3/uL (ref 0.00–0.07)
Basophils Absolute: 0.1 10*3/uL (ref 0.0–0.1)
Basophils Relative: 1 %
Eosinophils Absolute: 0.1 10*3/uL (ref 0.0–0.5)
Eosinophils Relative: 1 %
HCT: 39.3 % (ref 36.0–46.0)
Hemoglobin: 12.4 g/dL (ref 12.0–15.0)
Immature Granulocytes: 0 %
Lymphocytes Relative: 22 %
Lymphs Abs: 2.1 10*3/uL (ref 0.7–4.0)
MCH: 29.6 pg (ref 26.0–34.0)
MCHC: 31.6 g/dL (ref 30.0–36.0)
MCV: 93.8 fL (ref 80.0–100.0)
Monocytes Absolute: 0.4 10*3/uL (ref 0.1–1.0)
Monocytes Relative: 4 %
Neutro Abs: 6.9 10*3/uL (ref 1.7–7.7)
Neutrophils Relative %: 72 %
Platelets: 248 10*3/uL (ref 150–400)
RBC: 4.19 MIL/uL (ref 3.87–5.11)
RDW: 12.6 % (ref 11.5–15.5)
WBC: 9.6 10*3/uL (ref 4.0–10.5)
nRBC: 0 % (ref 0.0–0.2)

## 2019-06-30 LAB — COMPREHENSIVE METABOLIC PANEL
ALT: 15 U/L (ref 0–44)
AST: 19 U/L (ref 15–41)
Albumin: 4.1 g/dL (ref 3.5–5.0)
Alkaline Phosphatase: 130 U/L — ABNORMAL HIGH (ref 38–126)
Anion gap: 10 (ref 5–15)
BUN: 14 mg/dL (ref 8–23)
CO2: 26 mmol/L (ref 22–32)
Calcium: 8.6 mg/dL — ABNORMAL LOW (ref 8.9–10.3)
Chloride: 102 mmol/L (ref 98–111)
Creatinine, Ser: 0.82 mg/dL (ref 0.44–1.00)
GFR calc Af Amer: >60 mL/min (ref >60)
GFR calc non Af Amer: >60 mL/min (ref >60)
Glucose, Bld: 105 mg/dL — ABNORMAL HIGH (ref 70–99)
Potassium: 4.2 mmol/L (ref 3.5–5.1)
Sodium: 138 mmol/L (ref 135–145)
Total Bilirubin: 0.3 mg/dL (ref 0.3–1.2)
Total Protein: 7 g/dL (ref 6.5–8.1)

## 2019-06-30 MED ORDER — CEPHALEXIN 500 MG PO CAPS
500.0000 mg | ORAL_CAPSULE | Freq: Three times a day (TID) | ORAL | 0 refills | Status: DC
Start: 2019-06-30 — End: 2019-11-07

## 2019-06-30 MED ORDER — PHENAZOPYRIDINE HCL 200 MG PO TABS
200.0000 mg | ORAL_TABLET | Freq: Three times a day (TID) | ORAL | 0 refills | Status: DC
Start: 2019-06-30 — End: 2021-04-14

## 2019-06-30 MED ORDER — PHENAZOPYRIDINE HCL 200 MG PO TABS
200.0000 mg | ORAL_TABLET | Freq: Three times a day (TID) | ORAL | Status: DC
  Administered 2019-06-30: 200 mg via ORAL
  Filled 2019-06-30: qty 1

## 2019-06-30 MED ORDER — CEPHALEXIN 500 MG PO CAPS
500.0000 mg | ORAL_CAPSULE | Freq: Once | ORAL | Status: AC
  Administered 2019-06-30: 500 mg via ORAL
  Filled 2019-06-30: qty 1

## 2019-06-30 MED ORDER — HYDROCODONE-ACETAMINOPHEN 5-325 MG PO TABS: 1.0000 | ORAL_TABLET | ORAL | 0 refills | Status: DC | PRN

## 2019-06-30 NOTE — ED Provider Notes (Signed)
[facility]-EMERGENCY DEPT Provider Note   CSN: [id number] Arrival date & time: 06/29/19  2134     History Chief Complaint  Patient presents with  . Abdominal Pain  . Dysuria    Jasmin Munoz is a 69 y.o. female.  The history is provided by the patient.  Abdominal Pain Associated symptoms: dysuria   Dysuria Associated symptoms: abdominal pain   She has history of hypertension, COPD and comes in because of abdominal pain and difficulty urinating.  She has been having episodes where she has extreme urinary urgency but is only able to get a few drops of urine out.  When she has these episodes, she has pain which goes from her vagina into her lower abdomen and sometimes into the left flank.  Episodes will last several hours.  She did see her urologist this past week and was told she did not have a UTI and is scheduled to have a cystoscopy next week.  She denies fever or chills and denies nausea or vomiting.  Of note, she did have a bladder scan which showed a large amount of urine in her bladder that she was not aware of.  Past Medical History:  Diagnosis Date  . Anxiety   . Arthritis   . Cataract   . COPD (chronic obstructive pulmonary disease) (HCC)   . Depression   . Fibromyalgia   . Macular degeneration   . Seizures [facility])     Patient Active Problem List   Diagnosis Date Noted  . MDD (major depressive disorder), recurrent episode, severe (HCC) 09/13/2017  . COPD (chronic obstructive pulmonary disease) (HCC) 02/03/2013  . COPD exacerbation (HCC) 02/03/2013  . Hypoxemia 02/03/2013  . Generalized anxiety disorder 02/03/2013  . HTN (hypertension) 02/03/2013  . Nonspecific abnormal electrocardiogram (ECG) (EKG) 02/03/2013  . DEPRESSION 05/24/2006  . TENDINITIS 05/24/2006    Past Surgical History:  Procedure Laterality Date  . ABDOMINAL HYSTERECTOMY       OB History   No obstetric history on file.     Family History  Problem Relation Age of  Onset  . Emphysema Mother   . Arthritis Father   . Arthritis Sister   . Joint hypermobility Brother     Social History   Tobacco Use  . Smoking status: Current Every Day Smoker    Packs/day: 1.00    Years: 35.00    Pack years: 35.00    Types: Cigarettes    Last attempt to quit: 01/27/2013    Years since quitting: 6.4  . Smokeless tobacco: Current User  Substance Use Topics  . Alcohol use: No  . Drug use: No    Home Medications Prior to Admission medications   Medication Sig Start Date End Date Taking? Authorizing Provider  albuterol (VENTOLIN HFA) 108 (90 Base) MCG/ACT inhaler Inhale 1 puff into the lungs every 6 (six) hours as needed for wheezing or shortness of breath. 01/15/19   Shade Flood, MD  amitriptyline (ELAVIL) 10 MG tablet Take 10 mg by mouth at bedtime.    [provider]  amLODipine (NORVASC) 2.5 MG tablet Take 1 tablet (2.5 mg total) by mouth daily. 01/15/19   Shade Flood, MD  beclomethasone (QVAR REDIHALER) 80 MCG/ACT inhaler Inhale 2 puffs into the lungs 2 (two) times daily. 01/15/19   Shade Flood, MD  citalopram (CELEXA) 20 MG tablet Take 1 tablet (20 mg total) by mouth daily. Patient taking differently: Take 20 mg by mouth every evening.  09/16/17  Denzil Magnuson, NP  diazepam (VALIUM) 10 MG tablet Take 1 tablet (10 mg total) by mouth every 6 (six) hours as needed for anxiety. 05/23/13   Elvina Sidle, MD  metoprolol succinate (TOPROL-XL) 50 MG 24 hr tablet TAKE 1 TABLET BY MOUTH DAILY WITH OR IMMEDIATELY FOLLOWING A MEAL Patient taking differently: Take 50 mg by mouth every evening.  07/12/18   Doristine Bosworth, MD  naproxen sodium (ALEVE) 220 MG tablet Take 220 mg by mouth daily as needed (pain).     [provider]    Allergies    Sulfa antibiotics  Review of Systems   Review of Systems  Gastrointestinal: Positive for abdominal pain.  Genitourinary: Positive for dysuria.  All other systems reviewed and are  negative.   Physical Exam Updated Vital Signs BP 110/86 (BP Location: Left Arm)   Pulse 77   Temp 98 F (36.7 C) (Oral)   Resp 20   Ht 5\' 2"  (1.575 m)   Wt 49.9 kg   SpO2 95%   BMI 20.12 kg/m   Physical Exam Vitals and nursing note reviewed.   69 year old female, resting comfortably and in no acute distress. Vital signs are normal. Oxygen saturation is 95%, which is normal. Head is normocephalic and atraumatic. PERRLA, EOMI. Oropharynx is clear. Neck is nontender and supple without adenopathy or JVD. Back is nontender and there is no CVA tenderness. Lungs are clear without rales, wheezes, or rhonchi. Chest is nontender. Heart has regular rate and rhythm without murmur. Abdomen is soft, flat, with mild suprapubic tenderness.  There is no rebound or guarding.  There are no masses or hepatosplenomegaly and peristalsis is normoactive. Extremities have no cyanosis or edema, full range of motion is present. Skin is warm and dry without rash. Neurologic: Mental status is normal, cranial nerves are intact, there are no motor or sensory deficits.  ED Results / Procedures / Treatments   Labs (all labs ordered are listed, but only abnormal results are displayed) Labs Reviewed  URINALYSIS, ROUTINE W REFLEX MICROSCOPIC - Abnormal; Notable for the following components:      Result Value   Color, Urine AMBER (*)    APPearance CLOUDY (*)    Hgb urine dipstick SMALL (*)    Protein, ur 30 (*)    Nitrite POSITIVE (*)    Leukocytes,Ua LARGE (*)    WBC, UA >50 (*)    Non Squamous Epithelial 0-5 (*)    All other components within normal limits  COMPREHENSIVE METABOLIC PANEL - Abnormal; Notable for the following components:   Glucose, Bld 105 (*)    Calcium 8.6 (*)    Alkaline Phosphatase 130 (*)    All other components within normal limits  URINE CULTURE  CBC WITH DIFFERENTIAL/PLATELET   Procedures Procedures   Medications Ordered in ED Medications  cephALEXin (KEFLEX) capsule  500 mg (has no administration in time range)  phenazopyridine (PYRIDIUM) tablet 200 mg (has no administration in time range)    ED Course  I have reviewed the triage vital signs and the nursing notes.  Pertinent labs & imaging results that were available during my care of the patient were reviewed by me and considered in my medical decision making (see chart for details).  MDM Rules/Calculators/A&P Episodes of urinary urgency and suprapubic pain.  Symptoms are somewhat suggestive of bladder spasms, but report that she had increased urine in the bladder would argue against that.  Old records are reviewed, and she has no relevant past  visits.  Will check urinalysis and screening labs.  Bladder volume was 179 mL.  Urinalysis does show evidence of infection with positive nitrite, greater than 50 WBCs and presence of WBC clumps.  She is given dose of cephalexin.  Urine is sent for culture.  Blood work shows mild elevation of alkaline phosphatase and is otherwise unremarkable.  She is discharged with prescriptions for cephalexin and phenazopyridine.  She is also given a prescription for small number of hydrocodone-acetaminophen tablets.  Advised to follow-up with her urologist to get the cystoscopy as scheduled.  Return precautions discussed.  Final Clinical Impression(s) / ED Diagnoses Final diagnoses:  Urinary tract infection without hematuria, site unspecified    Rx / DC Orders ED Discharge Orders    None       Delora Fuel, MD 40/76/80 [phone]

## 2019-06-30 NOTE — ED Notes (Signed)
Pt was able to give a urine sample.Nurse bladder scanned pt post voiding, bladder volume was 179.

## 2019-06-30 NOTE — Discharge Instructions (Addendum)
Return if you are having any problems. 

## 2019-07-01 LAB — URINE CULTURE: Culture: 60000 — AB

## 2019-07-04 DIAGNOSIS — N3020 Other chronic cystitis without hematuria: Secondary | ICD-10-CM | POA: Diagnosis not present

## 2019-07-04 DIAGNOSIS — R3914 Feeling of incomplete bladder emptying: Secondary | ICD-10-CM | POA: Diagnosis not present

## 2019-10-06 ENCOUNTER — Emergency Department (HOSPITAL_COMMUNITY)
Admission: EM | Admit: 2019-10-06 | Discharge: 2019-10-06 | Disposition: A | Discharge status: Home / Self Care | Payer: Medicare Other | Attending: Emergency Medicine | Admitting: Emergency Medicine

## 2019-10-06 ENCOUNTER — Emergency Department (HOSPITAL_COMMUNITY): Payer: Medicare Other

## 2019-10-06 ENCOUNTER — Encounter (HOSPITAL_COMMUNITY): Payer: Self-pay

## 2019-10-06 DIAGNOSIS — Z5321 Procedure and treatment not carried out due to patient leaving prior to being seen by health care provider: Secondary | ICD-10-CM | POA: Insufficient documentation

## 2019-10-06 DIAGNOSIS — I10 Essential (primary) hypertension: Secondary | ICD-10-CM | POA: Diagnosis not present

## 2019-10-06 DIAGNOSIS — R001 Bradycardia, unspecified: Secondary | ICD-10-CM | POA: Diagnosis not present

## 2019-10-06 DIAGNOSIS — J449 Chronic obstructive pulmonary disease, unspecified: Secondary | ICD-10-CM | POA: Diagnosis not present

## 2019-10-06 DIAGNOSIS — R069 Unspecified abnormalities of breathing: Secondary | ICD-10-CM | POA: Diagnosis not present

## 2019-10-06 DIAGNOSIS — R000 Tachycardia, unspecified: Secondary | ICD-10-CM | POA: Diagnosis not present

## 2019-10-06 DIAGNOSIS — R0602 Shortness of breath: Secondary | ICD-10-CM | POA: Insufficient documentation

## 2019-10-06 LAB — BASIC METABOLIC PANEL
Anion gap: 9 (ref 5–15)
BUN: 7 mg/dL — ABNORMAL LOW (ref 8–23)
CO2: 30 mmol/L (ref 22–32)
Calcium: 9 mg/dL (ref 8.9–10.3)
Chloride: 104 mmol/L (ref 98–111)
Creatinine, Ser: 0.88 mg/dL (ref 0.44–1.00)
GFR calc Af Amer: >60 mL/min (ref >60)
GFR calc non Af Amer: >60 mL/min (ref >60)
Glucose, Bld: 96 mg/dL (ref 70–99)
Potassium: 4.1 mmol/L (ref 3.5–5.1)
Sodium: 143 mmol/L (ref 135–145)

## 2019-10-06 LAB — CBC
HCT: 42.4 % (ref 36.0–46.0)
Hemoglobin: 13.3 g/dL (ref 12.0–15.0)
MCH: 29.4 pg (ref 26.0–34.0)
MCHC: 31.4 g/dL (ref 30.0–36.0)
MCV: 93.8 fL (ref 80.0–100.0)
Platelets: 242 10*3/uL (ref 150–400)
RBC: 4.52 MIL/uL (ref 3.87–5.11)
RDW: 12.2 % (ref 11.5–15.5)
WBC: 6.3 10*3/uL (ref 4.0–10.5)
nRBC: 0 % (ref 0.0–0.2)

## 2019-10-06 LAB — TROPONIN I (HIGH SENSITIVITY): Troponin I (High Sensitivity): 5 ng/L (ref <18)

## 2019-10-06 NOTE — ED Notes (Signed)
Pt IV was took out  

## 2019-10-06 NOTE — ED Triage Notes (Signed)
Patient arrived by [facility] for increasing SOB. States that she has COPD and has not been seen by primary MD in years, smoker. sats 90% at home and placed on 2L. Patient alert and oriented, NAD

## 2019-10-06 NOTE — ED Notes (Addendum)
Pt called daughter said she can no longer wait. Pt said the wait was to long. Pt said she did not want to keep oxygen on and she is going outside to wait on daughter. Pt told the risk of leaving. Pt was encouraged to stay and told the risk of leaving

## 2019-10-17 ENCOUNTER — Ambulatory Visit (INDEPENDENT_AMBULATORY_CARE_PROVIDER_SITE_OTHER): Payer: Medicare Other | Admitting: Registered Nurse

## 2019-10-17 ENCOUNTER — Encounter: Payer: Self-pay | Admitting: Registered Nurse

## 2019-10-17 ENCOUNTER — Other Ambulatory Visit: Payer: Self-pay

## 2019-10-17 VITALS — BP 127/86 | HR 94 | Temp 98.3°F | Resp 18 | Ht 62.0 in | Wt 108.8 lb

## 2019-10-17 DIAGNOSIS — R9431 Abnormal electrocardiogram [ECG] [EKG]: Secondary | ICD-10-CM

## 2019-10-17 DIAGNOSIS — J449 Chronic obstructive pulmonary disease, unspecified: Secondary | ICD-10-CM

## 2019-10-17 DIAGNOSIS — R0600 Dyspnea, unspecified: Secondary | ICD-10-CM | POA: Diagnosis not present

## 2019-10-17 DIAGNOSIS — Z8639 Personal history of other endocrine, nutritional and metabolic disease: Secondary | ICD-10-CM | POA: Diagnosis not present

## 2019-10-17 DIAGNOSIS — R0609 Other forms of dyspnea: Secondary | ICD-10-CM

## 2019-10-17 LAB — POCT GLYCOSYLATED HEMOGLOBIN (HGB A1C): Hemoglobin A1C: 5.3 % (ref 4.0–5.6)

## 2019-10-17 MED ORDER — IPRATROPIUM-ALBUTEROL 0.5-2.5 (3) MG/3ML IN SOLN
3.0000 mL | Freq: Once | RESPIRATORY_TRACT | Status: DC
  Administered 2019-10-17: 3 mL via RESPIRATORY_TRACT

## 2019-10-17 MED ORDER — TIOTROPIUM BROMIDE MONOHYDRATE 18 MCG IN CAPS: 18.0000 ug | ORAL_CAPSULE | Freq: Every day | RESPIRATORY_TRACT | 3 refills | Status: DC

## 2019-10-17 MED ORDER — IPRATROPIUM-ALBUTEROL 0.5-2.5 (3) MG/3ML IN SOLN
3.0000 mL | Freq: Once | RESPIRATORY_TRACT | Status: AC
Start: 2019-10-17 — End: 2019-10-17

## 2019-10-17 NOTE — Patient Instructions (Signed)
° ° ° °  If you have lab work done today you will be contacted with your lab results within the next 2 weeks.  If you have not heard from us then please contact us. The fastest way to get your results is to register for My Chart. ° ° °IF you received an x-ray today, you will receive an invoice from [facility]. Please contact [facility] at [phone] with questions or concerns regarding your invoice.  ° °IF you received labwork today, you will receive an invoice from [facility]. Please contact [facility] at [phone] with questions or concerns regarding your invoice.  ° °Our billing staff will not be able to assist you with questions regarding bills from these companies. ° °You will be contacted with the lab results as soon as they are available. The fastest way to get your results is to activate your My Chart account. Instructions are located on the last page of this paperwork. If you have not heard from us regarding the results in 2 weeks, please contact this office. °  ° ° ° °

## 2019-10-18 LAB — CBC WITH DIFFERENTIAL
Basophils Absolute: 0.1 10*3/uL (ref 0.0–0.2)
Basos: 1 %
EOS (ABSOLUTE): 0.1 10*3/uL (ref 0.0–0.4)
Eos: 1 %
Hematocrit: 39.8 % (ref 34.0–46.6)
Hemoglobin: 12.7 g/dL (ref 11.1–15.9)
Immature Grans (Abs): 0 10*3/uL (ref 0.0–0.1)
Immature Granulocytes: 0 %
Lymphocytes Absolute: 4.5 10*3/uL — ABNORMAL HIGH (ref 0.7–3.1)
Lymphs: 50 %
MCH: 29 pg (ref 26.6–33.0)
MCHC: 31.9 g/dL (ref 31.5–35.7)
MCV: 91 fL (ref 79–97)
Monocytes Absolute: 0.5 10*3/uL (ref 0.1–0.9)
Monocytes: 6 %
Neutrophils Absolute: 3.8 10*3/uL (ref 1.4–7.0)
Neutrophils: 42 %
RBC: 4.38 x10E6/uL (ref 3.77–5.28)
RDW: 12 % (ref 11.7–15.4)
WBC: 9 10*3/uL (ref 3.4–10.8)

## 2019-10-18 LAB — BRAIN NATRIURETIC PEPTIDE: BNP: 13.6 pg/mL (ref 0.0–100.0)

## 2019-10-18 LAB — COMPREHENSIVE METABOLIC PANEL
ALT: 13 IU/L (ref 0–32)
AST: 21 IU/L (ref 0–40)
Albumin/Globulin Ratio: 1.5 (ref 1.2–2.2)
Albumin: 4 g/dL (ref 3.8–4.8)
Alkaline Phosphatase: 130 IU/L — ABNORMAL HIGH (ref 44–121)
BUN/Creatinine Ratio: 12 (ref 12–28)
BUN: 11 mg/dL (ref 8–27)
Bilirubin Total: <0.2 mg/dL (ref 0.0–1.2)
CO2: 28 mmol/L (ref 20–29)
Calcium: 8.8 mg/dL (ref 8.7–10.3)
Chloride: 105 mmol/L (ref 96–106)
Creatinine, Ser: 0.92 mg/dL (ref 0.57–1.00)
GFR calc Af Amer: 74 mL/min/{1.73_m2} (ref >59)
GFR calc non Af Amer: 64 mL/min/{1.73_m2} (ref >59)
Globulin, Total: 2.6 g/dL (ref 1.5–4.5)
Glucose: 87 mg/dL (ref 65–99)
Potassium: 4.3 mmol/L (ref 3.5–5.2)
Sodium: 145 mmol/L — ABNORMAL HIGH (ref 134–144)
Total Protein: 6.6 g/dL (ref 6.0–8.5)

## 2019-10-18 LAB — D-DIMER, QUANTITATIVE: D-DIMER: 0.44 mg/L FEU (ref 0.00–0.49)

## 2019-10-18 LAB — TSH: TSH: 1.88 u[IU]/mL (ref 0.450–4.500)

## 2019-10-18 NOTE — Progress Notes (Signed)
Acute Office Visit  Subjective:    Patient ID: Jasmin Munoz, female    DOB: 02/21/1950, 69 y.o.   MRN: [id number]  Chief Complaint  Patient presents with  . Shortness of Breath    Patient states she has COPD  and has been having problems breathing since last week . PAtient had to go to hospital by ambulance she states she only had a steroid shot and sat for 8 hours without being seen.     HPI Patient is in today for COPD exacerbation.   Was brought by EMS to hospital last week for difficulty breathing. Was told this was copd exacerbation, given steroid shot, and discharged. However, symptoms persisted. At this time having chest tightness, wheezing, fatigue, and malaise. Some cough but not much sputum. No fevers, chills, fatigue, nvd, sick contacts.   Notes that she has an albuterol inhaler, QVar inhaler, and albuterol nebulizer at home. These have not been effective.  Denies cyanosis, cns symptoms, or other concerns at this time.  Past Medical History:  Diagnosis Date  . Anxiety   . Arthritis   . Cataract   . COPD (chronic obstructive pulmonary disease) (HCC)   . Depression   . Fibromyalgia   . Macular degeneration   . Seizures (HCC)     Past Surgical History:  Procedure Laterality Date  . ABDOMINAL HYSTERECTOMY      Family History  Problem Relation Age of Onset  . Emphysema Mother   . Arthritis Father   . Arthritis Sister   . Joint hypermobility Brother     Social History   Socioeconomic History  . Marital status: Married    Spouse name: Not on file  . Number of children: Not on file  . Years of education: Not on file  . Highest education level: Not on file  Occupational History  . Not on file  Tobacco Use  . Smoking status: Current Every Day Smoker    Packs/day: 1.00    Years: 35.00    Pack years: 35.00    Types: Cigarettes    Last attempt to quit: 01/27/2013    Years since quitting: 6.7  . Smokeless tobacco: Current User  Substance and Sexual  Activity  . Alcohol use: No  . Drug use: No  . Sexual activity: Not Currently  Other Topics Concern  . Not on file  Social History Narrative  . Not on file   Social Determinants of Health   Financial Resource Strain:   . Difficulty of Paying Living Expenses: Not on file  Food Insecurity:   . Worried About Programme researcher, broadcasting/film/video in the Last Year: Not on file  . Ran Out of Food in the Last Year: Not on file  Transportation Needs:   . Lack of Transportation (Medical): Not on file  . Lack of Transportation (Non-Medical): Not on file  Physical Activity:   . Days of Exercise per Week: Not on file  . Minutes of Exercise per Session: Not on file  Stress:   . Feeling of Stress : Not on file  Social Connections:   . Frequency of Communication with Friends and Family: Not on file  . Frequency of Social Gatherings with Friends and Family: Not on file  . Attends Religious Services: Not on file  . Active Member of Clubs or Organizations: Not on file  . Attends Banker Meetings: Not on file  . Marital Status: Not on file  Intimate Partner Violence:   .  Fear of Current or Ex-Partner: Not on file  . Emotionally Abused: Not on file  . Physically Abused: Not on file  . Sexually Abused: Not on file    Outpatient Medications Prior to Visit  Medication Sig Dispense Refill  . albuterol (VENTOLIN HFA) 108 (90 Base) MCG/ACT inhaler Inhale 1 puff into the lungs every 6 (six) hours as needed for wheezing or shortness of breath. 6.7 g 3  . diazepam (VALIUM) 10 MG tablet Take 1 tablet (10 mg total) by mouth every 6 (six) hours as needed for anxiety. 30 tablet 5  . phenazopyridine (PYRIDIUM) 200 MG tablet Take 1 tablet (200 mg total) by mouth 3 (three) times daily. 9 tablet 0  . amitriptyline (ELAVIL) 10 MG tablet Take 10 mg by mouth at bedtime. (Patient not taking: Reported on 10/17/2019)    . amLODipine (NORVASC) 2.5 MG tablet Take 1 tablet (2.5 mg total) by mouth daily. (Patient not taking:  Reported on 10/17/2019) 90 tablet 1  . beclomethasone (QVAR REDIHALER) 80 MCG/ACT inhaler Inhale 2 puffs into the lungs 2 (two) times daily. (Patient not taking: Reported on 10/17/2019) 10.6 g 3  . cephALEXin (KEFLEX) 500 MG capsule Take 1 capsule (500 mg total) by mouth 3 (three) times daily. (Patient not taking: Reported on 10/17/2019) 30 capsule 0  . citalopram (CELEXA) 20 MG tablet Take 1 tablet (20 mg total) by mouth daily. (Patient not taking: Reported on 10/17/2019) 30 tablet 0  . HYDROcodone-acetaminophen (NORCO) 5-325 MG tablet Take 1 tablet by mouth every 4 (four) hours as needed for moderate pain. (Patient not taking: Reported on 10/17/2019) 10 tablet 0  . metoprolol succinate (TOPROL-XL) 50 MG 24 hr tablet TAKE 1 TABLET BY MOUTH DAILY WITH OR IMMEDIATELY FOLLOWING A MEAL (Patient not taking: Reported on 10/17/2019) 30 tablet 0  . naproxen sodium (ALEVE) 220 MG tablet Take 220 mg by mouth daily as needed (pain).  (Patient not taking: Reported on 10/17/2019)     No facility-administered medications prior to visit.    Allergies  Allergen Reactions  . Sulfa Antibiotics Hives    Review of Systems Pertinent positives and negatives reviewed in HPI     Objective:    Physical Exam Vitals and nursing note reviewed.  Constitutional:      General: She is in acute distress.     Appearance: She is well-developed. She is ill-appearing. She is not toxic-appearing or diaphoretic.  HENT:     Head: Normocephalic and atraumatic.  Eyes:     Extraocular Movements: Extraocular movements intact.     Pupils: Pupils are equal, round, and reactive to light.  Cardiovascular:     Rate and Rhythm: Normal rate and regular rhythm.     Pulses: Normal pulses.     Heart sounds: Normal heart sounds.  Pulmonary:     Effort: Respiratory distress present. No accessory muscle usage.     Breath sounds: No stridor. Examination of the right-upper field reveals wheezing. Examination of the left-upper field reveals  wheezing. Examination of the right-middle field reveals wheezing. Examination of the left-middle field reveals wheezing. Examination of the right-lower field reveals decreased breath sounds. Examination of the left-lower field reveals decreased breath sounds. Decreased breath sounds and wheezing present. No rhonchi or rales.  Chest:     Chest wall: No mass, deformity, tenderness, crepitus or edema. There is no dullness to percussion.  Musculoskeletal:     Cervical back: Normal range of motion.  Skin:    General: Skin is warm and  dry.     Coloration: Skin is not cyanotic.  Neurological:     General: No focal deficit present.     Mental Status: She is alert and oriented to person, place, and time.     Cranial Nerves: No cranial nerve deficit.     Motor: No weakness.  Psychiatric:        Mood and Affect: Mood normal. Mood is not anxious.        Behavior: Behavior normal. Behavior is not agitated.     BP 127/86   Pulse 94   Temp 98.3 F (36.8 C) (Temporal)   Resp 18   Ht 5\' 2"  (1.575 m)   Wt 108 lb 12.8 oz (49.4 kg)   SpO2 92%   BMI 19.90 kg/m  Wt Readings from Last 3 Encounters:  10/17/19 108 lb 12.8 oz (49.4 kg)  10/06/19 105 lb (47.6 kg)  06/29/19 110 lb (49.9 kg)    There are no preventive care reminders to display for this patient.  There are no preventive care reminders to display for this patient.   Lab Results  Component Value Date   TSH 1.880 10/17/2019   Lab Results  Component Value Date   WBC 9.0 10/17/2019   HGB 12.7 10/17/2019   HCT 39.8 10/17/2019   MCV 91 10/17/2019   PLT 242 10/06/2019   Lab Results  Component Value Date   NA 145 (H) 10/17/2019   K 4.3 10/17/2019   CO2 28 10/17/2019   GLUCOSE 87 10/17/2019   BUN 11 10/17/2019   CREATININE 0.92 10/17/2019   BILITOT <0.2 10/17/2019   ALKPHOS 130 (H) 10/17/2019   AST 21 10/17/2019   ALT 13 10/17/2019   PROT 6.6 10/17/2019   ALBUMIN 4.0 10/17/2019   CALCIUM 8.8 10/17/2019   ANIONGAP 9  10/06/2019   Lab Results  Component Value Date   CHOL 174 01/15/2019   Lab Results  Component Value Date   HDL 77 01/15/2019   Lab Results  Component Value Date   LDLCALC 75 01/15/2019   Lab Results  Component Value Date   TRIG 128 01/15/2019   Lab Results  Component Value Date   CHOLHDL 2.3 01/15/2019   Lab Results  Component Value Date   HGBA1C 5.3 10/17/2019       Assessment & Plan:   Problem List Items Addressed This Visit      Other   Nonspecific abnormal electrocardiogram (ECG) (EKG)   Relevant Orders   CBC With Differential (Completed)   Comprehensive metabolic panel (Completed)   TSH (Completed)   Brain natriuretic peptide (Completed)   [facility]er, [facility] ([facility]) (Completed)    Other Visit Diagnoses    DOE (dyspnea on exertion)    -  Primary   Relevant Medications   ipratropium-albuterol (DUONEB) 0.5-2.5 (3) MG/3ML nebulizer solution 3 mL (Completed)   Other Relevant Orders   EKG 12-Lead (Completed)   CBC With Differential (Completed)   Comprehensive metabolic panel (Completed)   TSH (Completed)   Brain natriuretic peptide (Completed)   D-dimer, [facility] ([facility]) (Completed)   Ambulatory referral to Pulmonology   COPD, moderate (HCC)       Relevant Medications   ipratropium-albuterol (DUONEB) 0.5-2.5 (3) MG/3ML nebulizer solution 3 mL (Completed)   tiotropium (SPIRIVA) 18 MCG inhalation capsule   Other Relevant Orders   CBC With Differential (Completed)   Comprehensive metabolic panel (Completed)   TSH (Completed)   Brain natriuretic peptide (Completed)   D-dimer, quantitative (  not at [facility]) (Completed)   Ambulatory referral to Pulmonology   History of elevated glucose       Relevant Orders   POCT glycosylated hemoglobin (Hb A1C) (Completed)       Meds ordered this encounter  Medications  . DISCONTD: ipratropium-albuterol (DUONEB) 0.5-2.5 (3) MG/3ML nebulizer solution 3 mL  . ipratropium-albuterol (DUONEB) 0.5-2.5 (3)  MG/3ML nebulizer solution 3 mL  . tiotropium (SPIRIVA) 18 MCG inhalation capsule    Sig: Place 1 capsule (18 mcg total) into inhaler and inhale daily.    Dispense:  90 capsule    Refill:  3    Order Specific Question:   Supervising Provider    Answer:   Neva Seat, JEFFREY R [[id number]]   PLAN  Given patient duoneb nebulizer. Improvement clinically and in vitals.   On 2L O2 via [place] throughout visit. Maintained O2 sat of 98-100%.  After nebulizer was maintaining 96%+ on O2 sat at rest.  CXR shows no acute abnormality.  Will give spiriva for home use. ER precautions reviewed  Refer to pulmonology for further outpatient management  Patient encouraged to call clinic with any questions, comments, or concerns.   Janeece Agee, NP

## 2019-10-19 ENCOUNTER — Encounter: Payer: Self-pay | Admitting: Registered Nurse

## 2019-10-31 ENCOUNTER — Ambulatory Visit (INDEPENDENT_AMBULATORY_CARE_PROVIDER_SITE_OTHER): Payer: Medicare Other | Admitting: Registered Nurse

## 2019-10-31 ENCOUNTER — Other Ambulatory Visit: Payer: Self-pay

## 2019-10-31 ENCOUNTER — Encounter: Payer: Self-pay | Admitting: Registered Nurse

## 2019-10-31 ENCOUNTER — Ambulatory Visit (INDEPENDENT_AMBULATORY_CARE_PROVIDER_SITE_OTHER): Payer: Medicare Other

## 2019-10-31 VITALS — BP 131/91 | HR 85 | Temp 97.6°F | Ht 62.0 in | Wt 113.0 lb

## 2019-10-31 DIAGNOSIS — R0602 Shortness of breath: Secondary | ICD-10-CM

## 2019-10-31 DIAGNOSIS — J449 Chronic obstructive pulmonary disease, unspecified: Secondary | ICD-10-CM | POA: Diagnosis not present

## 2019-10-31 DIAGNOSIS — R0989 Other specified symptoms and signs involving the circulatory and respiratory systems: Secondary | ICD-10-CM

## 2019-10-31 MED ORDER — PREDNISONE 10 MG PO TABS: ORAL_TABLET | ORAL | 0 refills | Status: AC

## 2019-10-31 MED ORDER — PANTOPRAZOLE SODIUM 40 MG PO TBEC: 40.0000 mg | DELAYED_RELEASE_TABLET | Freq: Every day | ORAL | 3 refills | Status: DC

## 2019-10-31 MED ORDER — SUCRALFATE 1 G PO TABS: 1.0000 g | ORAL_TABLET | Freq: Three times a day (TID) | ORAL | 0 refills | Status: DC

## 2019-10-31 NOTE — Patient Instructions (Signed)
° ° ° °  If you have lab work done today you will be contacted with your lab results within the next 2 weeks.  If you have not heard from us then please contact us. The fastest way to get your results is to register for My Chart. ° ° °IF you received an x-ray today, you will receive an invoice from [facility]. Please contact  Radiology at [phone] with questions or concerns regarding your invoice.  ° °IF you received labwork today, you will receive an invoice from [facility]. Please contact [facility] at [phone] with questions or concerns regarding your invoice.  ° °Our billing staff will not be able to assist you with questions regarding bills from these companies. ° °You will be contacted with the lab results as soon as they are available. The fastest way to get your results is to activate your My Chart account. Instructions are located on the last page of this paperwork. If you have not heard from us regarding the results in 2 weeks, please contact this office. °  ° ° ° °

## 2019-11-01 LAB — SARS-COV-2, NAA 2 DAY TAT

## 2019-11-01 LAB — NOVEL CORONAVIRUS, NAA: SARS-CoV-2, NAA: NOT DETECTED

## 2019-11-07 ENCOUNTER — Other Ambulatory Visit: Payer: Self-pay

## 2019-11-07 ENCOUNTER — Encounter: Payer: Self-pay | Admitting: Pulmonary Disease

## 2019-11-07 ENCOUNTER — Ambulatory Visit (INDEPENDENT_AMBULATORY_CARE_PROVIDER_SITE_OTHER): Payer: Medicare Other | Admitting: Pulmonary Disease

## 2019-11-07 VITALS — BP 132/88 | HR 100 | Temp 98.8°F | Ht 62.0 in | Wt 112.8 lb

## 2019-11-07 DIAGNOSIS — J441 Chronic obstructive pulmonary disease with (acute) exacerbation: Secondary | ICD-10-CM

## 2019-11-07 DIAGNOSIS — J449 Chronic obstructive pulmonary disease, unspecified: Secondary | ICD-10-CM

## 2019-11-07 MED ORDER — FORMOTEROL FUMARATE 20 MCG/2ML IN NEBU: 20.0000 ug | INHALATION_SOLUTION | Freq: Two times a day (BID) | RESPIRATORY_TRACT | 11 refills | Status: DC

## 2019-11-07 MED ORDER — AZITHROMYCIN 250 MG PO TABS: 250.0000 mg | ORAL_TABLET | Freq: Every day | ORAL | 0 refills | Status: AC

## 2019-11-07 MED ORDER — BUDESONIDE 0.5 MG/2ML IN SUSP: 0.5000 mg | Freq: Two times a day (BID) | RESPIRATORY_TRACT | 12 refills | Status: DC

## 2019-11-07 MED ORDER — PREDNISONE 10 MG PO TABS: 40.0000 mg | ORAL_TABLET | Freq: Every day | ORAL | 0 refills | Status: AC

## 2019-11-07 NOTE — Patient Instructions (Addendum)
I worry your breathing issues are related to the COPD.  Use budesonide and formoterol nebulized twice a day every day to help with this. These are new.  Take prednisone 40 mg (4 tab) daily for 5 days and azithromycin 1 tab daily for 5 days.   Use Dounebs twice a day and as needed during the day.  We will get breathing tests in the next couple of weeks to help [place] know where your lung function is.  Come back in 6 weeks with Dr. Judeth Horn.

## 2019-11-12 NOTE — Progress Notes (Signed)
Patient ID: Jasmin Munoz, female    DOB: 10/20/1950, 69 y.o.   MRN: [id number]  Chief Complaint  Patient presents with  . pulmonary consult    per Dr. Kateri Plummer-- hx od COPD. c/o prod cough with yellow to clear mucus, wheezing and sob with exertion x1y    Referring provider: Janeece Agee, NP  HPI:   Jasmin Munoz is a 69 year old woman with reported history of COPD number seen in consultation at the request of Janeece Agee, NP for evaluation of COPD dyspnea on exertion.  Notes from referring provider reviewed.  Notes from recent ED visits as well as hospitalization in 2020 also reviewed.  I cannot find any primary care PFTs reviewed.  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  She has had recurrent exacerbations including hospitalization in 2020 for acute hypoxic respiratory failure presumed due to COPD.  She was treated with antibiotics and steroids and improved .  On 10/06/2019 she went to the ED.  There are no provider notes.  Reportedly she got a dose of Solu-Medrol and was discharged home.  Mild improvement for short period time with resumption of current symptoms.  She has severe dyspnea on exertion with minimal exertion.  This includes walking on flat surfaces.  Worse on inclines.  She has productive cough that is worse.  She feels short of breath at rest.  She occasionally feels wheezy.  She has felt this way on and off for some time.  Recently seen by her primary care provider 10/17/2019 giving Spiriva.  She cannot afford this and not using this.  She states she has tried made inhalers in the past and none have been effective.  She has been on Qvar recently and not effective.  Steroids seem to help for short period time but goes back to her bad breathing pattern.  Says that she has significant arthritis. This was well treated with opiates but was stopped. She says this helped her breathing as well.   PMH:  Tobacco abuse,  COPD Surgical history: Hyeterctomy Family History: COPD, CAD, Cancer Social History: Current smoker, lives alone  Questionaires / Pulmonary Flowsheets:   ACT:  No flowsheet data found.  MMRC: No flowsheet data found.  Epworth:  No flowsheet data found.  Tests:   FENO:  No results found for: NITRICOXIDE  PFT: No flowsheet data found.  WALK:  No flowsheet data found.  Imaging: Personally reviewed and as per EMR and discussion this note DG Chest 2 View  Result Date: 10/31/2019 CLINICAL DATA:  Shortness of breath EXAM: CHEST - 2 VIEW COMPARISON:  10/06/2019 FINDINGS: Cardiac shadow is within normal limits. The lungs are hyper aerated bilaterally. No focal infiltrate or sizable effusion is seen. No bony abnormality is noted. IMPRESSION: Mild COPD without acute abnormality. Electronically Signed   By: Alcide Clever M.D.   On: 10/31/2019 12:21    Lab Results: Personally reviewed, no significant elevation in eosinophils CBC    Component Value Date/Time   WBC 9.0 10/17/2019 1622   WBC 6.3 10/06/2019 1526   RBC 4.38 10/17/2019 1622   RBC 4.52 10/06/2019 1526   HGB 12.7 10/17/2019 1622   HCT 39.8 10/17/2019 1622   PLT 242 10/06/2019 1526   PLT 228 07/05/2016 1000   MCV 91 10/17/2019 1622   MCH 29.0 10/17/2019 1622   MCH 29.4 10/06/2019 1526   MCHC 31.9 10/17/2019 1622   MCHC 31.4 10/06/2019 1526   RDW 12.0  10/17/2019 1622   LYMPHSABS 4.5 (H) 10/17/2019 1622   MONOABS 0.4 06/30/2019 0103   EOSABS 0.1 10/17/2019 1622   BASOSABS 0.1 10/17/2019 1622    BMET    Component Value Date/Time   NA 145 (H) 10/17/2019 1622   K 4.3 10/17/2019 1622   CL 105 10/17/2019 1622   CO2 28 10/17/2019 1622   GLUCOSE 87 10/17/2019 1622   GLUCOSE 96 10/06/2019 1526   BUN 11 10/17/2019 1622   CREATININE 0.92 10/17/2019 1622   CREATININE 0.75 06/17/2011 1040   CALCIUM 8.8 10/17/2019 1622   GFRNONAA 64 10/17/2019 1622   GFRAA 74 10/17/2019 1622    BNP    Component Value Date/Time    BNP 13.6 10/17/2019 1622   BNP 109.5 (H) 12/03/2018 1143    ProBNP    Component Value Date/Time   PROBNP 590.1 (H) 02/03/2013 1220    Specialty Problems      Pulmonary Problems   COPD (chronic obstructive pulmonary disease) (HCC)   COPD exacerbation (HCC)      Allergies  Allergen Reactions  . Sulfa Antibiotics Hives    Immunization History  Administered Date(s) Administered  . PFIZER SARS-COV-2 Vaccination 07/20/2019, 08/07/2019    Past Medical History:  Diagnosis Date  . Anxiety   . Arthritis   . Cataract   . COPD (chronic obstructive pulmonary disease) (HCC)   . Depression   . Fibromyalgia   . Macular degeneration   . Seizures (HCC)     Tobacco History: Social History   Tobacco Use  Smoking Status Current Every Day Smoker  . Packs/day: 1.00  . Years: 35.00  . Pack years: 35.00  . Types: Cigarettes  . Last attempt to quit: 01/27/2013  . Years since quitting: 6.7  Smokeless Tobacco Current User  Tobacco Comment   4 cigs daily-11/07/2019   Ready to quit: Not Answered Counseling given: Not Answered Comment: 4 cigs daily-11/07/2019   Outpatient Encounter Medications as of 11/07/2019  Medication Sig  . albuterol (VENTOLIN HFA) 108 (90 Base) MCG/ACT inhaler Inhale 1 puff into the lungs every 6 (six) hours as needed for wheezing or shortness of breath.  Marland Kitchen amitriptyline (ELAVIL) 10 MG tablet Take 10 mg by mouth at bedtime.   Marland Kitchen amLODipine (NORVASC) 2.5 MG tablet Take 1 tablet (2.5 mg total) by mouth daily.  . citalopram (CELEXA) 20 MG tablet Take 1 tablet (20 mg total) by mouth daily.  . diazepam (VALIUM) 10 MG tablet Take 1 tablet (10 mg total) by mouth every 6 (six) hours as needed for anxiety.  Marland Kitchen HYDROcodone-acetaminophen (NORCO) 5-325 MG tablet Take 1 tablet by mouth every 4 (four) hours as needed for moderate pain.  . metoprolol succinate (TOPROL-XL) 50 MG 24 hr tablet TAKE 1 TABLET BY MOUTH DAILY WITH OR IMMEDIATELY FOLLOWING A MEAL  . naproxen  sodium (ALEVE) 220 MG tablet Take 220 mg by mouth daily as needed (pain).   . pantoprazole (PROTONIX) 40 MG tablet Take 1 tablet (40 mg total) by mouth daily.  . phenazopyridine (PYRIDIUM) 200 MG tablet Take 1 tablet (200 mg total) by mouth 3 (three) times daily.  . sucralfate (CARAFATE) 1 g tablet Take 1 tablet (1 g total) by mouth 4 (four) times daily -  with meals and at bedtime.  . [DISCONTINUED] beclomethasone (QVAR REDIHALER) 80 MCG/ACT inhaler Inhale 2 puffs into the lungs 2 (two) times daily.  . [DISCONTINUED] cephALEXin (KEFLEX) 500 MG capsule Take 1 capsule (500 mg total) by mouth 3 (three) times  daily.  . [DISCONTINUED] tiotropium (SPIRIVA) 18 MCG inhalation capsule Place 1 capsule (18 mcg total) into inhaler and inhale daily.  Marland Kitchen azithromycin (ZITHROMAX) 250 MG tablet Take 1 tablet (250 mg total) by mouth daily for 5 doses.  . budesonide (PULMICORT) 0.5 MG/2ML nebulizer solution Take 2 mLs (0.5 mg total) by nebulization in the morning and at bedtime.  . formoterol (PERFOROMIST) 20 MCG/2ML nebulizer solution Take 2 mLs (20 mcg total) by nebulization 2 (two) times daily.  . predniSONE (DELTASONE) 10 MG tablet Take 4 tablets (40 mg total) by mouth daily with breakfast for 5 days.   No facility-administered encounter medications on file as of 11/07/2019.     Review of Systems  Review of Systems  No chest pain with exertion.  No orthopnea or PND.  Comprehensive review of systems otherwise negative. Physical Exam  BP 132/88 (BP Location: Left Arm, Cuff Size: Normal)   Pulse 100   Temp 98.8 F (37.1 C) (Temporal)   Ht 5\' 2"  (1.575 m)   Wt 112 lb 12.8 oz (51.2 kg)   SpO2 94%   BMI 20.63 kg/m   Wt Readings from Last 5 Encounters:  11/07/19 112 lb 12.8 oz (51.2 kg)  10/31/19 113 lb (51.3 kg)  10/17/19 108 lb 12.8 oz (49.4 kg)  10/06/19 105 lb (47.6 kg)  06/29/19 110 lb (49.9 kg)    BMI Readings from Last 5 Encounters:  11/07/19 20.63 kg/m  10/31/19 20.67 kg/m  10/17/19  19.90 kg/m  10/06/19 19.20 kg/m  06/29/19 20.12 kg/m     Physical Exam General: Chronically ill appearing, in NAD Eyes EOMI, no icterus Neck: No JVD appreciated, supple Respiratory: Distant breath sounds, poor air movement, no wheezing Cardiovascular: Regular rhythm, no murmurs Abdomen: Nondistended, bowel sounds present Extremities: Warm, no edema MSK: No synovitis, joint effusion Neuro: Normal gait, no weakness Psych: Normal mood, flat affect   Assessment & Plan:   Presumed COPD Exacerbation.: Significant smoking history, hyperinflation on CXR. She is symptomatic today. Increased cough and DOE. Prednisone 40 mg daily x 5 days and azithromycin daily for 5 days.  Presumed COPD: poorly controlled with hospitalization 12/2018. ED visit 10/2019 but left prior to treatment. Reports inhalers have not helped in past and are too expensive. Provided ICS/LABA therapy via new script for budesonide and formoterol. To continue DUONEBS BID and as needed. This provided triple therapy. PFTs in coming weeks ordered.   Tobacco abuse: Precontemplative, address next visit  Lung cancer screening: Unclear severity of COPD but given symptoms do worry that it is quite severe.  Pending PFTs and assessment symptoms on therapy, we will readdress the utility of lung cancer screening as not sure she would get benefit at this time.  Return in about 6 weeks (around 12/19/2019).   12/21/2019, MD 11/12/2019

## 2019-11-13 ENCOUNTER — Telehealth: Payer: Self-pay | Admitting: Pulmonary Disease

## 2019-11-13 DIAGNOSIS — J449 Chronic obstructive pulmonary disease, unspecified: Secondary | ICD-10-CM

## 2019-11-13 MED ORDER — FORMOTEROL FUMARATE 20 MCG/2ML IN NEBU: 20.0000 ug | INHALATION_SOLUTION | Freq: Two times a day (BID) | RESPIRATORY_TRACT | 11 refills | Status: DC

## 2019-11-13 MED ORDER — BUDESONIDE 0.5 MG/2ML IN SUSP: 0.5000 mg | Freq: Two times a day (BID) | RESPIRATORY_TRACT | 11 refills | Status: DC

## 2019-11-13 NOTE — Telephone Encounter (Signed)
Called and spoke with pt who stated she has not received the nebulizer machine or the solutions. Pt said she was told by Dr. Judeth Horn to not use her spiriva but said her breathing got worse due to her not using that or not having the neb sol so pt had to use the spiriva to help with her breathing. Stated to pt that I would make sure order for neb machine is placed and also stated I would send the Rx for the neb sol to [facility] and she verbalized understanding.  Order placed for the neb machine and solutions have been sent electronically to [facility].  Nothing further needed.

## 2019-12-05 ENCOUNTER — Other Ambulatory Visit: Payer: Self-pay

## 2019-12-05 ENCOUNTER — Ambulatory Visit (INDEPENDENT_AMBULATORY_CARE_PROVIDER_SITE_OTHER): Payer: Medicare Other | Admitting: Pulmonary Disease

## 2019-12-05 ENCOUNTER — Encounter: Payer: Self-pay | Admitting: Registered Nurse

## 2019-12-05 DIAGNOSIS — J449 Chronic obstructive pulmonary disease, unspecified: Secondary | ICD-10-CM | POA: Diagnosis not present

## 2019-12-05 LAB — PULMONARY FUNCTION TEST
DL/VA % pred: 56 %
DL/VA: 2.41 ml/min/mmHg/L
DLCO cor % pred: 53 %
DLCO cor: 9.37 ml/min/mmHg
DLCO unc % pred: 53 %
DLCO unc: 9.37 ml/min/mmHg
FEF 25-75 Post: 0.48 L/sec
FEF 25-75 Pre: 0.27 L/sec
FEF2575-%Change-Post: 78 %
FEF2575-%Pred-Post: 26 %
FEF2575-%Pred-Pre: 15 %
FEV1-%Change-Post: 30 %
FEV1-%Pred-Post: 44 %
FEV1-%Pred-Pre: 33 %
FEV1-Post: 0.88 L
FEV1-Pre: 0.68 L
FEV1FVC-%Change-Post: 1 %
FEV1FVC-%Pred-Pre: 53 %
FEV6-%Change-Post: 27 %
FEV6-%Pred-Post: 78 %
FEV6-%Pred-Pre: 61 %
FEV6-Post: 1.99 L
FEV6-Pre: 1.56 L
FEV6FVC-%Change-Post: -1 %
FEV6FVC-%Pred-Post: 96 %
FEV6FVC-%Pred-Pre: 97 %
FVC-%Change-Post: 28 %
FVC-%Pred-Post: 81 %
FVC-%Pred-Pre: 63 %
FVC-Post: 2.15 L
FVC-Pre: 1.67 L
Post FEV1/FVC ratio: 41 %
Post FEV6/FVC ratio: 93 %
Pre FEV1/FVC ratio: 41 %
Pre FEV6/FVC Ratio: 94 %

## 2019-12-05 NOTE — Progress Notes (Signed)
Spirometry pre and post and dlco done today. 

## 2019-12-05 NOTE — Progress Notes (Signed)
Acute Office Visit  Subjective:    Patient ID: Jasmin Munoz, female    DOB: 06/25/1950, 69 y.o.   MRN: [id number]  Chief Complaint  Patient presents with   Shortness of Breath    Pt stated that she has been experiencing SOB and this has been happening everyday.    HPI Patient is in today for acute shotness of breath. Feels like she cannot catch her breath. Mild coughing, not productive. Denies headaches, chest pain, palpitations, lightheadedness, dizziness, falls, hx of clot. Does note hx of COPD. Does have occ exacerbations but this feels somewhat different. States she has used her at home meds, including albuterol nebulizer, without relief.  Unsure of what triggered this  Past Medical History:  Diagnosis Date   Anxiety    Arthritis    Cataract    COPD (chronic obstructive pulmonary disease) (HCC)    Depression    Fibromyalgia    Macular degeneration    Seizures (HCC)     Past Surgical History:  Procedure Laterality Date   ABDOMINAL HYSTERECTOMY      Family History  Problem Relation Age of Onset   Emphysema Mother    Arthritis Father    Arthritis Sister    Joint hypermobility Brother     Social History   Socioeconomic History   Marital status: Married    Spouse name: Not on file   Number of children: Not on file   Years of education: Not on file   Highest education level: Not on file  Occupational History   Not on file  Tobacco Use   Smoking status: Current Every Day Smoker    Packs/day: 1.00    Years: 35.00    Pack years: 35.00    Types: Cigarettes    Last attempt to quit: 01/27/2013    Years since quitting: 6.8   Smokeless tobacco: Current User   Tobacco comment: 4 cigs daily-11/07/2019  Substance and Sexual Activity   Alcohol use: No   Drug use: No   Sexual activity: Not Currently  Other Topics Concern   Not on file  Social History Narrative   Not on file   Social Determinants of Health   Financial Resource  Strain:    Difficulty of Paying Living Expenses: Not on file  Food Insecurity:    Worried About Running Out of Food in the Last Year: Not on file   [employer] Out of Food in the Last Year: Not on file  Transportation Needs:    Lack of Transportation (Medical): Not on file   Lack of Transportation (Non-Medical): Not on file  Physical Activity:    Days of Exercise per Week: Not on file   Minutes of Exercise per Session: Not on file  Stress:    Feeling of Stress : Not on file  Social Connections:    Frequency of Communication with Friends and Family: Not on file   Frequency of Social Gatherings with Friends and Family: Not on file   Attends Religious Services: Not on file   Active Member of Clubs or Organizations: Not on file   Attends BankerClub or Organization Meetings: Not on file   Marital Status: Not on file  Intimate Partner Violence:    Fear of Current or Ex-Partner: Not on file   Emotionally Abused: Not on file   Physically Abused: Not on file   Sexually Abused: Not on file    Outpatient Medications Prior to Visit  Medication Sig Dispense Refill   citalopram (  CELEXA) 20 MG tablet Take 1 tablet (20 mg total) by mouth daily. 30 tablet 0   diazepam (VALIUM) 10 MG tablet Take 1 tablet (10 mg total) by mouth every 6 (six) hours as needed for anxiety. 30 tablet 5   albuterol (VENTOLIN HFA) 108 (90 Base) MCG/ACT inhaler Inhale 1 puff into the lungs every 6 (six) hours as needed for wheezing or shortness of breath. 6.7 g 3   amitriptyline (ELAVIL) 10 MG tablet Take 10 mg by mouth at bedtime.      amLODipine (NORVASC) 2.5 MG tablet Take 1 tablet (2.5 mg total) by mouth daily. 90 tablet 1   HYDROcodone-acetaminophen (NORCO) 5-325 MG tablet Take 1 tablet by mouth every 4 (four) hours as needed for moderate pain. 10 tablet 0   metoprolol succinate (TOPROL-XL) 50 MG 24 hr tablet TAKE 1 TABLET BY MOUTH DAILY WITH OR IMMEDIATELY FOLLOWING A MEAL 30 tablet 0   naproxen sodium  (ALEVE) 220 MG tablet Take 220 mg by mouth daily as needed (pain).      phenazopyridine (PYRIDIUM) 200 MG tablet Take 1 tablet (200 mg total) by mouth 3 (three) times daily. 9 tablet 0   beclomethasone (QVAR REDIHALER) 80 MCG/ACT inhaler Inhale 2 puffs into the lungs 2 (two) times daily. 10.6 g 3   cephALEXin (KEFLEX) 500 MG capsule Take 1 capsule (500 mg total) by mouth 3 (three) times daily. 30 capsule 0   tiotropium (SPIRIVA) 18 MCG inhalation capsule Place 1 capsule (18 mcg total) into inhaler and inhale daily. 90 capsule 3   No facility-administered medications prior to visit.    Allergies  Allergen Reactions   Sulfa Antibiotics Hives    Review of Systems  Constitutional: Positive for fatigue.  HENT: Negative.   Eyes: Negative.   Respiratory: Positive for cough, chest tightness and shortness of breath.   Cardiovascular: Negative.   Gastrointestinal: Negative.   Genitourinary: Negative.   Musculoskeletal: Negative.   Skin: Negative.   Neurological: Negative.   Psychiatric/Behavioral: Negative.   All other systems reviewed and are negative.      Objective:    Physical Exam Vitals and nursing note reviewed.  Constitutional:      General: She is in acute distress.     Appearance: She is well-developed. She is ill-appearing. She is not toxic-appearing or diaphoretic.  Cardiovascular:     Rate and Rhythm: Normal rate and regular rhythm.     Heart sounds: Normal heart sounds.  Pulmonary:     Effort: Tachypnea present. No accessory muscle usage.     Breath sounds: Examination of the right-upper field reveals wheezing. Examination of the left-upper field reveals wheezing. Examination of the right-middle field reveals wheezing. Examination of the left-middle field reveals wheezing. Wheezing (discordant with reported symptoms, relatively mild) present. No decreased breath sounds, rhonchi or rales.  Chest:     Chest wall: No mass, deformity, tenderness, crepitus or edema.  There is no dullness to percussion.  Musculoskeletal:     Right lower leg: No tenderness. No edema.     Left lower leg: No tenderness. No edema.  Skin:    General: Skin is warm and dry.     Coloration: Skin is not cyanotic or pale.  Neurological:     General: No focal deficit present.     Mental Status: She is alert and oriented to person, place, and time.     Cranial Nerves: No cranial nerve deficit.     Motor: No weakness.  Psychiatric:  Mood and Affect: Mood is anxious.        Behavior: Behavior is agitated.     BP (!) 131/91 (BP Location: Right Arm, Patient Position: Sitting, Cuff Size: Normal)    Pulse 85    Temp 97.6 F (36.4 C) (Temporal)    Ht 5\' 2"  (1.575 m)    Wt 113 lb (51.3 kg)    SpO2 97%    BMI 20.67 kg/m  Wt Readings from Last 3 Encounters:  11/07/19 112 lb 12.8 oz (51.2 kg)  10/31/19 113 lb (51.3 kg)  10/17/19 108 lb 12.8 oz (49.4 kg)    There are no preventive care reminders to display for this patient.  There are no preventive care reminders to display for this patient.   Lab Results  Component Value Date   TSH 1.880 10/17/2019   Lab Results  Component Value Date   WBC 9.0 10/17/2019   HGB 12.7 10/17/2019   HCT 39.8 10/17/2019   MCV 91 10/17/2019   PLT 242 10/06/2019   Lab Results  Component Value Date   NA 145 (H) 10/17/2019   K 4.3 10/17/2019   CO2 28 10/17/2019   GLUCOSE 87 10/17/2019   BUN 11 10/17/2019   CREATININE 0.92 10/17/2019   BILITOT <0.2 10/17/2019   ALKPHOS 130 (H) 10/17/2019   AST 21 10/17/2019   ALT 13 10/17/2019   PROT 6.6 10/17/2019   ALBUMIN 4.0 10/17/2019   CALCIUM 8.8 10/17/2019   ANIONGAP 9 10/06/2019   Lab Results  Component Value Date   CHOL 174 01/15/2019   Lab Results  Component Value Date   HDL 77 01/15/2019   Lab Results  Component Value Date   LDLCALC 75 01/15/2019   Lab Results  Component Value Date   TRIG 128 01/15/2019   Lab Results  Component Value Date   CHOLHDL 2.3 01/15/2019    Lab Results  Component Value Date   HGBA1C 5.3 10/17/2019       Assessment & Plan:   Problem List Items Addressed This Visit    None    Visit Diagnoses    Shortness of breath    -  Primary   Relevant Orders   DG Chest 2 View (Completed)   CBC With Differential   Novel Coronavirus, NAA ([facility]) (Completed)   Comprehensive metabolic panel   Globus sensation       Relevant Medications   pantoprazole (PROTONIX) 40 MG tablet   sucralfate (CARAFATE) 1 g tablet       Meds ordered this encounter  Medications   predniSONE (DELTASONE) 10 MG tablet    Sig: Take 2 tablets (20 mg total) by mouth daily with breakfast for 3 days, THEN 1 tablet (10 mg total) daily with breakfast for 3 days.    Dispense:  9 tablet    Refill:  0    Order Specific Question:   Supervising Provider    Answer:   10/19/2019, JEFFREY R [[id number]]   pantoprazole (PROTONIX) 40 MG tablet    Sig: Take 1 tablet (40 mg total) by mouth daily.    Dispense:  30 tablet    Refill:  3    Order Specific Question:   Supervising Provider    Answer:   Neva Seat, JEFFREY R [[id number]]   sucralfate (CARAFATE) 1 g tablet    Sig: Take 1 tablet (1 g total) by mouth 4 (four) times daily -  with meals and at bedtime.    Dispense:  40 tablet  Refill:  0    Order Specific Question:   Supervising Provider    Answer:   Neva Seat, JEFFREY R [[id number]]   PLAN  cxr wnl no acute concerns for infectious process.  Patient denies history consistent with Betsy Coder triad and denies hx of dvt/pe/tia or other coagulopathy. No frothy pink sputum. Low risk for PE  Symptoms have been consistent since last visit, at which time nebulizer did not help  Upon further discussion patient admits to globus sensation. When asked about reflux/GERD, patient states that she has had these symptoms from GERD in the past, and admits she had not previously considered this to be the cause of her symptoms, but is very optimistic that starting on sucralfate and protonix would  provide relief.  Discussed nonpharm and ER precautions. Pt demonstrated understanding  Patient encouraged to call clinic with any questions, comments, or concerns.  Janeece Agee, NP

## 2019-12-14 ENCOUNTER — Other Ambulatory Visit: Payer: Self-pay

## 2019-12-14 ENCOUNTER — Emergency Department (HOSPITAL_COMMUNITY)
Admission: EM | Admit: 2019-12-14 | Discharge: 2019-12-14 | Disposition: A | Discharge status: Home / Self Care | Payer: Medicare Other | Attending: Emergency Medicine | Admitting: Emergency Medicine

## 2019-12-14 ENCOUNTER — Encounter (HOSPITAL_COMMUNITY): Payer: Self-pay

## 2019-12-14 DIAGNOSIS — F332 Major depressive disorder, recurrent severe without psychotic features: Secondary | ICD-10-CM | POA: Diagnosis not present

## 2019-12-14 DIAGNOSIS — J441 Chronic obstructive pulmonary disease with (acute) exacerbation: Secondary | ICD-10-CM | POA: Diagnosis not present

## 2019-12-14 DIAGNOSIS — R45851 Suicidal ideations: Secondary | ICD-10-CM | POA: Insufficient documentation

## 2019-12-14 DIAGNOSIS — Z7951 Long term (current) use of inhaled steroids: Secondary | ICD-10-CM | POA: Diagnosis not present

## 2019-12-14 DIAGNOSIS — F17210 Nicotine dependence, cigarettes, uncomplicated: Secondary | ICD-10-CM | POA: Insufficient documentation

## 2019-12-14 DIAGNOSIS — Z20822 Contact with and (suspected) exposure to covid-19: Secondary | ICD-10-CM | POA: Diagnosis not present

## 2019-12-14 DIAGNOSIS — Z79899 Other long term (current) drug therapy: Secondary | ICD-10-CM | POA: Diagnosis not present

## 2019-12-14 DIAGNOSIS — F411 Generalized anxiety disorder: Secondary | ICD-10-CM | POA: Diagnosis present

## 2019-12-14 DIAGNOSIS — I10 Essential (primary) hypertension: Secondary | ICD-10-CM | POA: Diagnosis not present

## 2019-12-14 DIAGNOSIS — R456 Violent behavior: Secondary | ICD-10-CM | POA: Diagnosis not present

## 2019-12-14 LAB — ACETAMINOPHEN LEVEL: Acetaminophen (Tylenol), Serum: <10 ug/mL — ABNORMAL LOW (ref 10–30)

## 2019-12-14 LAB — COMPREHENSIVE METABOLIC PANEL
ALT: 14 U/L (ref 0–44)
AST: 20 U/L (ref 15–41)
Albumin: 4.1 g/dL (ref 3.5–5.0)
Alkaline Phosphatase: 119 U/L (ref 38–126)
Anion gap: 13 (ref 5–15)
BUN: 16 mg/dL (ref 8–23)
CO2: 24 mmol/L (ref 22–32)
Calcium: 9.1 mg/dL (ref 8.9–10.3)
Chloride: 105 mmol/L (ref 98–111)
Creatinine, Ser: 0.82 mg/dL (ref 0.44–1.00)
GFR, Estimated: >60 mL/min (ref >60)
Glucose, Bld: 100 mg/dL — ABNORMAL HIGH (ref 70–99)
Potassium: 3.8 mmol/L (ref 3.5–5.1)
Sodium: 142 mmol/L (ref 135–145)
Total Bilirubin: 0.5 mg/dL (ref 0.3–1.2)
Total Protein: 7.3 g/dL (ref 6.5–8.1)

## 2019-12-14 LAB — RAPID URINE DRUG SCREEN, HOSP PERFORMED
Amphetamines: NOT DETECTED
Barbiturates: NOT DETECTED
Benzodiazepines: POSITIVE — AB
Cocaine: NOT DETECTED
Opiates: NOT DETECTED
Tetrahydrocannabinol: NOT DETECTED

## 2019-12-14 LAB — CBC
HCT: 40.9 % (ref 36.0–46.0)
Hemoglobin: 13.1 g/dL (ref 12.0–15.0)
MCH: 29.8 pg (ref 26.0–34.0)
MCHC: 32 g/dL (ref 30.0–36.0)
MCV: 93.2 fL (ref 80.0–100.0)
Platelets: 210 10*3/uL (ref 150–400)
RBC: 4.39 MIL/uL (ref 3.87–5.11)
RDW: 12.2 % (ref 11.5–15.5)
WBC: 10.7 10*3/uL — ABNORMAL HIGH (ref 4.0–10.5)
nRBC: 0 % (ref 0.0–0.2)

## 2019-12-14 LAB — ETHANOL: Alcohol, Ethyl (B): <10 mg/dL (ref <10)

## 2019-12-14 LAB — RESPIRATORY PANEL BY RT PCR (FLU A&B, COVID)
Influenza A by PCR: NEGATIVE
Influenza B by PCR: NEGATIVE
SARS Coronavirus 2 by RT PCR: NEGATIVE

## 2019-12-14 LAB — SALICYLATE LEVEL: Salicylate Lvl: <7 mg/dL — ABNORMAL LOW (ref 7.0–30.0)

## 2019-12-14 LAB — CBG MONITORING, ED: Glucose-Capillary: 99 mg/dL (ref 70–99)

## 2019-12-14 MED ORDER — AMMONIA AROMATIC IN INHA
RESPIRATORY_TRACT | Status: AC
  Filled 2019-12-14: qty 10

## 2019-12-14 MED ORDER — CITALOPRAM HYDROBROMIDE 10 MG PO TABS: 20.0000 mg | ORAL_TABLET | Freq: Every day | ORAL | Status: DC

## 2019-12-14 MED ORDER — BUDESONIDE 0.5 MG/2ML IN SUSP: 0.5000 mg | Freq: Every day | RESPIRATORY_TRACT | Status: DC

## 2019-12-14 MED ORDER — ALBUTEROL SULFATE HFA 108 (90 BASE) MCG/ACT IN AERS: 1.0000 | INHALATION_SPRAY | Freq: Four times a day (QID) | RESPIRATORY_TRACT | Status: DC | PRN

## 2019-12-14 MED ORDER — NICOTINE 21 MG/24HR TD PT24: 21.0000 mg | MEDICATED_PATCH | Freq: Once | TRANSDERMAL | Status: DC

## 2019-12-14 MED ORDER — NAPROXEN SODIUM 275 MG PO TABS
275.0000 mg | ORAL_TABLET | Freq: Every day | ORAL | Status: DC | PRN
  Filled 2019-12-14: qty 1

## 2019-12-14 MED ORDER — ARFORMOTEROL TARTRATE 15 MCG/2ML IN NEBU
15.0000 ug | INHALATION_SOLUTION | Freq: Two times a day (BID) | RESPIRATORY_TRACT | Status: DC
  Filled 2019-12-14 (×2): qty 2

## 2019-12-14 NOTE — BH Assessment (Signed)
[facility] Assessment Progress Note  Per Berneice Heinrich, NP, this pt does not require psychiatric hospitalization at this time.  Pt presents under IVC initiated by law enforcement and upheld by EDP April Palumbo, MD, which has been rescinded by EDP Linwood Dibbles, MD.  Pt is psychiatrically cleared.  Discharge instructions advise pt to continue treatment with Len Blalock, MD, her current outpatient provider, and to contact [facility] about their grief counseling program.  Dr Lynelle Doctor and pt's nurse, Morrie Sheldon, have been notified.  Per Inetta Fermo, pt's family will not be able to come to [facility] to pick pt up until 17:00, and they may be reluctant to follow through.  Inetta Fermo will order a TOC consult to expedite her return home.  Doylene Canning, MA Triage Specialist [phone]

## 2019-12-14 NOTE — ED Notes (Signed)
Patient to [facility]. Patient refuses medications and vitals at this time. Patient covers head with blanket. Sitter at bedside

## 2019-12-14 NOTE — ED Notes (Signed)
SW at bedside.

## 2019-12-14 NOTE — ED Notes (Signed)
Patient broke her glasses; threw in trash. States "she doesn't need them."

## 2019-12-14 NOTE — ED Notes (Signed)
Pt asleep in chair, resting at this time.
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
## 2019-12-14 NOTE — ED Notes (Addendum)
Pt yelling at staff for attempting to obtain vital signs. States she doesn't want it fuc*ing checked. Pt says she is leaving if she cannot get a cigarette. Refuses nicotine patch. Pt says she is allowed to do anything at her house and the hospital is not allowed to keep her. Pt attempting to elope. Security present. Pt refusing to remain in room. Currently laying in floor. VS obtained.

## 2019-12-14 NOTE — ED Notes (Addendum)
Pt expressed feelings of wanting to leave, explained we are waiting on our psychiatry team to re-evaluate her. Pt stated she is waiting until 10am and then leaving. Re-enforced that pt is under IVC and cannot leave, encouraged to be patient. Pt denying SI/HI at this time.

## 2019-12-14 NOTE — ED Notes (Signed)
Pt is stating she will be leaving at 12pm. Pt made aware of IVC orders and cannot leave. Sitter has been assigned to pt and at bedside.

## 2019-12-14 NOTE — ED Notes (Signed)
Family will arrive around 5. Transition of care consult. Talked to patty charge rn. Allow patient to eat lunch prior to leaving

## 2019-12-14 NOTE — ED Notes (Signed)
Pt refusing to remain in room so lays in floor. MD present to assess patient. Pt refusing to answer or acknowledge doctor. Ammonia inhalant administered and pt responds. Pt placed in bed and warm blankets provided.
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
## 2019-12-14 NOTE — ED Notes (Signed)
Pt has one bag of belongings placed in triage cabinet. ( shirt, pants, sock, tennis shoes)

## 2019-12-14 NOTE — ED Triage Notes (Addendum)
Pt arrives with [employer] with suicidal ideation. Grandson called PD. Pt admits plan to overdose with pills to law enforcement. IVC papers taken out by officer.

## 2019-12-14 NOTE — ED Provider Notes (Addendum)
Emergency Medicine Observation Re-evaluation Note  Jasmin Munoz is a 69 y.o. female, seen on rounds today.  Pt initially presented to the ED for complaints of IVC; Suicidal  Currently, the patient is awaiting psychiatric disposition.  Physical Exam  BP (!) 145/108 (BP Location: Right Arm)   Pulse 93   Temp 97.7 F (36.5 C) (Oral)   Resp 18   Ht 1.575 m (5\' 2" )   Wt 52 kg   SpO2 96%   BMI 20.97 kg/m  Physical Exam General: Alert awake fidgeting at the computer Cardiac: Regular rhythm Lungs: Breathing easily no distress Psych: Depressed mood anxious to leave  ED Course / MDM  EKG:    I have reviewed the labs performed to date as well as medications administered while in observation.  Recent changes in the last 24 hours include labs reviewed.  No significant abnormalities.  Plan  Current plan is for waiting for TTS assessment. Patient is under full IVC at this time.   , MD 12/14/19 1014  Pt has been assessed by psychiatry .  Plan on discharge.    13/12/21, MD 12/14/19 1217

## 2019-12-14 NOTE — ED Provider Notes (Signed)
[facility]-EMERGENCY DEPT Provider Note   CSN: [id number] Arrival date & time: 12/14/19  0143     History Chief Complaint  Patient presents with  . IVC; Suicidal     Jasmin Munoz is a 69 y.o. female.  Patient presents to the emergency department with a chief complaint of aggressive behavior.  She was IVC by [employer].  Patient's grandson called [employer] after the patient became agitated and had made threatening statements with regard to taking her own life by overdose.  Patient denies feeling suicidal or homicidal.  She states that she simply feels "pissed."  She denies any recent illnesses.  Denies any illicit drug use.  The history is provided by the patient. No language interpreter was used.       Past Medical History:  Diagnosis Date  . Anxiety   . Arthritis   . Cataract   . COPD (chronic obstructive pulmonary disease) (HCC)   . Depression   . Fibromyalgia   . Macular degeneration   . Seizures [facility])     Patient Active Problem List   Diagnosis Date Noted  . MDD (major depressive disorder), recurrent episode, severe (HCC) 09/13/2017  . COPD (chronic obstructive pulmonary disease) (HCC) 02/03/2013  . COPD exacerbation (HCC) 02/03/2013  . Hypoxemia 02/03/2013  . Generalized anxiety disorder 02/03/2013  . HTN (hypertension) 02/03/2013  . Nonspecific abnormal electrocardiogram (ECG) (EKG) 02/03/2013  . DEPRESSION 05/24/2006  . TENDINITIS 05/24/2006    Past Surgical History:  Procedure Laterality Date  . ABDOMINAL HYSTERECTOMY       OB History   No obstetric history on file.     Family History  Problem Relation Age of Onset  . Emphysema Mother   . Arthritis Father   . Arthritis Sister   . Joint hypermobility Brother     Social History   Tobacco Use  . Smoking status: Current Every Day Smoker    Packs/day: 1.00    Years: 35.00    Pack years: 35.00    Types: Cigarettes    Last attempt to quit: 01/27/2013    Years since quitting: 6.8    . Smokeless tobacco: Current User  . Tobacco comment: 4 cigs daily-11/07/2019  Substance Use Topics  . Alcohol use: No  . Drug use: No    Home Medications Prior to Admission medications   Medication Sig Start Date End Date Taking? Authorizing Provider  albuterol (VENTOLIN HFA) 108 (90 Base) MCG/ACT inhaler Inhale 1 puff into the lungs every 6 (six) hours as needed for wheezing or shortness of breath. 01/15/19   Shade Flood, MD  amitriptyline (ELAVIL) 10 MG tablet Take 10 mg by mouth at bedtime.     [provider]  amLODipine (NORVASC) 2.5 MG tablet Take 1 tablet (2.5 mg total) by mouth daily. 01/15/19   Shade Flood, MD  budesonide (PULMICORT) 0.5 MG/2ML nebulizer solution Take 2 mLs (0.5 mg total) by nebulization in the morning and at bedtime. 11/13/19   Hunsucker, Lesia Sago, MD  citalopram (CELEXA) 20 MG tablet Take 1 tablet (20 mg total) by mouth daily. 09/16/17   Denzil Magnuson, NP  diazepam (VALIUM) 10 MG tablet Take 1 tablet (10 mg total) by mouth every 6 (six) hours as needed for anxiety. 05/23/13   Elvina Sidle, MD  formoterol (PERFOROMIST) 20 MCG/2ML nebulizer solution Take 2 mLs (20 mcg total) by nebulization 2 (two) times daily. 11/13/19   Hunsucker, Lesia Sago, MD  HYDROcodone-acetaminophen (NORCO) 5-325 MG tablet Take 1  tablet by mouth every 4 (four) hours as needed for moderate pain. 06/30/19   Dione Booze, MD  metoprolol succinate (TOPROL-XL) 50 MG 24 hr tablet TAKE 1 TABLET BY MOUTH DAILY WITH OR IMMEDIATELY FOLLOWING A MEAL 07/12/18   Collie Siad A, MD  naproxen sodium (ALEVE) 220 MG tablet Take 220 mg by mouth daily as needed (pain).     [provider]  pantoprazole (PROTONIX) 40 MG tablet Take 1 tablet (40 mg total) by mouth daily. 10/31/19   Janeece Agee, NP  phenazopyridine (PYRIDIUM) 200 MG tablet Take 1 tablet (200 mg total) by mouth 3 (three) times daily. 06/30/19   Dione Booze, MD  sucralfate (CARAFATE) 1 g tablet Take 1 tablet (1  g total) by mouth 4 (four) times daily -  with meals and at bedtime. 10/31/19   Janeece Agee, NP    Allergies    Sulfa antibiotics  Review of Systems   Review of Systems  All other systems reviewed and are negative.   Physical Exam Updated Vital Signs BP (!) 139/94 (BP Location: Right Arm)   Pulse 98   Temp 97.7 F (36.5 C) (Oral)   Resp 19   Ht 5\' 2"  (1.575 m)   Wt 52 kg   SpO2 100%   BMI 20.97 kg/m   Physical Exam Vitals and nursing note reviewed.  Constitutional:      General: She is not in acute distress.    Appearance: She is well-developed.  HENT:     Head: Normocephalic and atraumatic.  Eyes:     Conjunctiva/sclera: Conjunctivae normal.  Cardiovascular:     Rate and Rhythm: Normal rate.     Heart sounds: No murmur heard.   Pulmonary:     Effort: Pulmonary effort is normal. No respiratory distress.  Abdominal:     General: There is no distension.  Musculoskeletal:        General: Normal range of motion.     Cervical back: Neck supple.     Comments: Moves all extremities  Skin:    General: Skin is warm and dry.  Neurological:     Mental Status: She is alert and oriented to person, place, and time.  Psychiatric:        Mood and Affect: Mood normal.        Behavior: Behavior normal.     ED Results / Procedures / Treatments   Labs (all labs ordered are listed, but only abnormal results are displayed) Labs Reviewed  COMPREHENSIVE METABOLIC PANEL - Abnormal; Notable for the following components:      Result Value   Glucose, Bld 100 (*)    All other components within normal limits  SALICYLATE LEVEL - Abnormal; Notable for the following components:   Salicylate Lvl <7.0 (*)    All other components within normal limits  ACETAMINOPHEN LEVEL - Abnormal; Notable for the following components:   Acetaminophen (Tylenol), Serum <10 (*)    All other components within normal limits  CBC - Abnormal; Notable for the following components:   WBC 10.7 (*)     All other components within normal limits  RESPIRATORY PANEL BY RT PCR (FLU A&B, COVID)  ETHANOL  RAPID URINE DRUG SCREEN, HOSP PERFORMED    EKG None  Radiology No results found.  Procedures Procedures (including critical care time)  Medications Ordered in ED Medications - No data to display  ED Course  I have reviewed the triage vital signs and the nursing notes.  Pertinent labs &  imaging results that were available during my care of the patient were reviewed by me and considered in my medical decision making (see chart for details).    MDM Rules/Calculators/A&P                          Patient here under IVC.  Reportedly threatened suicide by overdosing on pills.  Patient states she is mad at her family members because of domestic arrangements.  Patient appears medically stable and cleared for TTS evaluation.  Per TTS, recommendation is for patient to be reassessed in the morning by psychiatry.   Final Clinical Impression(s) / ED Diagnoses Final diagnoses:  Verbalizes suicidal thoughts    Rx / DC Orders ED Discharge Orders    None       Roxy Horseman, PA-C 12/14/19 0427    Palumbo, April, MD 12/14/19 0602

## 2019-12-14 NOTE — ED Notes (Signed)
Patient belongings at nursing station 16-22.

## 2019-12-14 NOTE — Progress Notes (Signed)
TOC CM spoke to pt and gave permission to speak to dtr. Dtr states pt is has a way to get into the home. Explained we will provide [employer]. Dtr gave verbal permission via phone. Pt is alert and oriented and able to sign waiver. Provided pt with copy of waiver, Scanned to transportation. Isidoro Donning RN CCM, [facility] ED TOC CM [phone]

## 2019-12-14 NOTE — ED Notes (Signed)
Pt refuses lunch. 

## 2019-12-14 NOTE — ED Notes (Signed)
Pt cooperative to [place]. Belongings in cabinet.

## 2019-12-14 NOTE — ED Notes (Signed)
Took patient back to [place] [facility] and she had one personal belonging bag that the NT+3 carlos put in the cabinet behind the desk

## 2019-12-14 NOTE — Discharge Instructions (Signed)
For your behavioral health needs, you are advised to continue treatment with Len Blalock, MD:       Len Blalock, MD      [address]      [place], [place] [address]      [phone]  When facing the loss of a loved one, many people find comfort in grief counseling.  Psychologist, sport and exercise to ask about enrolling in their grief counseling program:       Civil engineer, [employer] of [place]      [address] [place], [place] [address]      [phone]

## 2019-12-17 ENCOUNTER — Telehealth: Payer: Self-pay | Admitting: Pulmonary Disease

## 2019-12-17 NOTE — Telephone Encounter (Signed)
She has moderately severe COPD with decreased diffusion capacity.  She has excellent BD response.  She is not scheduled for her 6 week follow up, which should be about now.  Pt has moderately severe COPD per PFT's. She needs to proceed to the ER as she is most likely having an exacerbation of her COPD, which needs treatment, or pneumonia, and needs eval for PE. Marland Kitchen

## 2019-12-17 NOTE — Telephone Encounter (Signed)
I called the patient to see if I could convince her to go  to the hospital or come to the office to be seen for CXR and evaluation of her oxygen saturations and over all condition. . She refuses to go to the hospital or come to the office. She states she is not leaving her house today. She states she understands that it is not in her best interest to stay at home when she cannot breath. She does not have an oxygen saturation monitor, so we do not know her oxygen sats.  She is aware this is not in her best interest. She again has declined either an OV or the ED.  We reviewed her PFT results, which is the reason she called the office. . I explained that maintenance inhalers could possibly help her, and to continue using her rescue inhalers as she has been doing, as she does have good BD response on PFT. I also explained that I was concerned she may need oxygen, which she can get immediately at the hospital, and even at the office. She again declined an OV or to advice to go to the ED.  She is scheduled for follow up with Jasmin Munoz 11/29, which I encouraged her to move to a sooner appointment.She refused.  .  I again tried to convince her to come to the office or go to the ED. She continues to refuse. I asked her to seek emergency care.  I told her to call the office if she changed her mind about being seen.

## 2019-12-17 NOTE — Telephone Encounter (Signed)
Noted by triage.  

## 2019-12-17 NOTE — Telephone Encounter (Addendum)
Patient is aware of recommendations and voiced her understanding.  Patient refused ED. Educated patient on need for ED, as imaging and treatment may be needed. appt scheduled with Dr. Judeth Horn 12/31/2019 at 9:15. Nothing further needed.   Routing to Maralyn Sago, as an Financial planner.

## 2019-12-17 NOTE — Telephone Encounter (Signed)
Dr. Judeth Horn patient COPD. Patient last seen 11/07/2019. Pending recall for 12/2019. Patient is requesting PFT results.  Patient stated that she is experiencing increased sob, bilateral rib pain and non productive cough. sx have been present since 11/2019.   Work of breathing and grunting was present during our conversation. Patient unable to complete a full sentence without stopping to catch her breath. Using Ventolin 1-2x daily, pulmicort and Perforomist BID with no relief.  Denied fever, chills or chills.  She does not have supplemental oxygen. She is taking aleve once daily for rib pain. She has had both covid vaccines. She does not take flu shot. Advised ED for eval due to increase work of breathing. Patient declined.  Current Outpatient Medications on File Prior to Visit  Medication Sig Dispense Refill   albuterol (VENTOLIN HFA) 108 (90 Base) MCG/ACT inhaler Inhale 1 puff into the lungs every 6 (six) hours as needed for wheezing or shortness of breath. 6.7 g 3   amLODipine (NORVASC) 2.5 MG tablet Take 1 tablet (2.5 mg total) by mouth daily. (Patient not taking: Reported on 12/14/2019) 90 tablet 1   budesonide (PULMICORT) 0.5 MG/2ML nebulizer solution Take 2 mLs (0.5 mg total) by nebulization in the morning and at bedtime. 120 mL 11   citalopram (CELEXA) 20 MG tablet Take 1 tablet (20 mg total) by mouth daily. 30 tablet 0   diazepam (VALIUM) 10 MG tablet Take 1 tablet (10 mg total) by mouth every 6 (six) hours as needed for anxiety. 30 tablet 5   formoterol (PERFOROMIST) 20 MCG/2ML nebulizer solution Take 2 mLs (20 mcg total) by nebulization 2 (two) times daily. 120 mL 11   HYDROcodone-acetaminophen (NORCO) 5-325 MG tablet Take 1 tablet by mouth every 4 (four) hours as needed for moderate pain. (Patient not taking: Reported on 12/14/2019) 10 tablet 0   metoprolol succinate (TOPROL-XL) 50 MG 24 hr tablet TAKE 1 TABLET BY MOUTH DAILY WITH OR IMMEDIATELY FOLLOWING A MEAL (Patient not  taking: Reported on 12/14/2019) 30 tablet 0   naproxen sodium (ALEVE) 220 MG tablet Take 220 mg by mouth daily as needed (pain).      pantoprazole (PROTONIX) 40 MG tablet Take 1 tablet (40 mg total) by mouth daily. (Patient not taking: Reported on 12/14/2019) 30 tablet 3   phenazopyridine (PYRIDIUM) 200 MG tablet Take 1 tablet (200 mg total) by mouth 3 (three) times daily. (Patient not taking: Reported on 12/14/2019) 9 tablet 0   sucralfate (CARAFATE) 1 g tablet Take 1 tablet (1 g total) by mouth 4 (four) times daily -  with meals and at bedtime. (Patient not taking: Reported on 12/14/2019) 40 tablet 0   No current facility-administered medications on file prior to visit.    Allergies  Allergen Reactions   Sulfa Antibiotics Hives      Sarah,please advise, as Dr. Judeth Horn is unavailable.

## 2019-12-18 ENCOUNTER — Telehealth: Payer: Self-pay | Admitting: *Deleted

## 2019-12-18 ENCOUNTER — Telehealth: Payer: Self-pay | Admitting: Registered Nurse

## 2019-12-18 NOTE — Telephone Encounter (Signed)
Schedule a hospital follow up

## 2019-12-18 NOTE — Telephone Encounter (Signed)
Called patient to schedule HFU ,Patient refused to make HFU appointment / Patient request that we don't call her back regarding this .  What do you think ?  She sounded a little confused . I am a little concerned

## 2019-12-31 ENCOUNTER — Ambulatory Visit (INDEPENDENT_AMBULATORY_CARE_PROVIDER_SITE_OTHER): Payer: Medicare Other | Admitting: Pulmonary Disease

## 2019-12-31 ENCOUNTER — Encounter: Payer: Self-pay | Admitting: Pulmonary Disease

## 2019-12-31 ENCOUNTER — Other Ambulatory Visit: Payer: Self-pay

## 2019-12-31 VITALS — BP 112/68 | HR 81 | Temp 97.1°F | Ht 62.0 in | Wt 116.6 lb

## 2019-12-31 DIAGNOSIS — J4540 Moderate persistent asthma, uncomplicated: Secondary | ICD-10-CM

## 2019-12-31 DIAGNOSIS — J449 Chronic obstructive pulmonary disease, unspecified: Secondary | ICD-10-CM | POA: Diagnosis not present

## 2019-12-31 DIAGNOSIS — Z720 Tobacco use: Secondary | ICD-10-CM | POA: Diagnosis not present

## 2019-12-31 NOTE — Progress Notes (Signed)
Patient ID: Jasmin Munoz, female    DOB: 12/19/1950, 69 y.o.   MRN: [id number]  Chief Complaint  Patient presents with  . Follow-up    SOB worse last week     Referring provider: Janeece AgeeMorrow, Richard, NP  HPI:   Ms. Veatrice KellsCaleca is a 69 year old woman with reported history of COPD whom are seen in follow-up with recent PFTs negative for severe COPD with significant bronchodilator response indicative of likely asthma.  2 weeks ago, patient was quite symptomatic.  She describes terrible dyspnea on exertion.  Only able to walk 5 feet before needing to stop with significant labored breathing.  States that with this she had tightness or pain in her chest and around her ribs.  She says she felt she was dying.  She contacted the office given severity of symptoms recommend she go to the ED.  She refused this.  She was also offered to be seen in the office for evaluation.  She refused this as well.  Somehow this seems to have self resolved with a course of several days without significant intervention.  She is unsure why.  We discussed at length versus PFTs which show severe fixed obstruction with significant bronchodilator response.  Discussed rationale for considering biologic therapy given she is on good inhaled medicines and has severe symptoms as well as frequent exacerbations.  She did not seem to comprehend this conversation.  Similar discussions were repeated at least 3 more times without her ability to fully comprehend.  She obviously wants improvement but has not followed up and did not follow recommendations over the phone recently.  There seems to be a disconnect between what she wants and her actions.   HPI initial visit I cannot find any primary care PFTs to review.  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  She has had recurrent exacerbations including hospitalization in 2020 for acute hypoxic respiratory failure presumed due to COPD.  She was  treated with antibiotics and steroids and improved .  On 10/06/2019 she went to the ED.  There are no provider notes.  Reportedly she got a dose of Solu-Medrol and was discharged home.  Mild improvement for short period time with resumption of current symptoms.  She has severe dyspnea on exertion with minimal exertion.  This includes walking on flat surfaces.  Worse on inclines.  She has productive cough that is worse.  She feels short of breath at rest.  She occasionally feels wheezy.  She has felt this way on and off for some time.  Recently seen by her primary care provider 10/17/2019 giving Spiriva.  She cannot afford this and not using this.  She states she has tried made inhalers in the past and none have been effective.  She has been on Qvar recently and not effective.  Steroids seem to help for short period time but goes back to her bad breathing pattern.  Says that she has significant arthritis. This was well treated with opiates but was stopped. She says this helped her breathing as well.   PMH:  Tobacco abuse, COPD Surgical history: Hyeterctomy Family History: COPD, CAD, Cancer Social History: Current smoker, lives alone  Questionaires / Pulmonary Flowsheets:   ACT:  No flowsheet data found.  MMRC: No flowsheet data found.  Epworth:  No flowsheet data found.  Tests:   FENO:  No results found for: NITRICOXIDE  PFT: PFT Results Latest Ref Rng & Units 12/05/2019  FVC-Pre L 1.67  FVC-Predicted Pre % 63  FVC-Post L 2.15  FVC-Predicted Post % 81  Pre FEV1/FVC % % 41  Post FEV1/FCV % % 41  FEV1-Pre L 0.68  FEV1-Predicted Pre % 33  FEV1-Post L 0.88  DLCO uncorrected ml/min/mmHg 9.37  DLCO UNC% % 53  DLCO corrected ml/min/mmHg 9.37  DLCO COR %Predicted % 53  DLVA Predicted % 56    WALK:  No flowsheet data found.  Imaging: Personally reviewed and as per EMR and discussion this note No results found.  Lab Results: Personally reviewed, no significant elevation in  eosinophils CBC    Component Value Date/Time   WBC 10.7 (H) 12/14/2019 0200   RBC 4.39 12/14/2019 0200   HGB 13.1 12/14/2019 0200   HGB 12.7 10/17/2019 1622   HCT 40.9 12/14/2019 0200   HCT 39.8 10/17/2019 1622   PLT 210 12/14/2019 0200   PLT 228 07/05/2016 1000   MCV 93.2 12/14/2019 0200   MCV 91 10/17/2019 1622   MCH 29.8 12/14/2019 0200   MCHC 32.0 12/14/2019 0200   RDW 12.2 12/14/2019 0200   RDW 12.0 10/17/2019 1622   LYMPHSABS 4.5 (H) 10/17/2019 1622   MONOABS 0.4 06/30/2019 0103   EOSABS 0.1 10/17/2019 1622   BASOSABS 0.1 10/17/2019 1622    BMET    Component Value Date/Time   NA 142 12/14/2019 0200   NA 145 (H) 10/17/2019 1622   K 3.8 12/14/2019 0200   CL 105 12/14/2019 0200   CO2 24 12/14/2019 0200   GLUCOSE 100 (H) 12/14/2019 0200   BUN 16 12/14/2019 0200   BUN 11 10/17/2019 1622   CREATININE 0.82 12/14/2019 0200   CREATININE 0.75 06/17/2011 1040   CALCIUM 9.1 12/14/2019 0200   GFRNONAA >60 12/14/2019 0200   GFRAA 74 10/17/2019 1622    BNP    Component Value Date/Time   BNP 13.6 10/17/2019 1622   BNP 109.5 (H) 12/03/2018 1143    ProBNP    Component Value Date/Time   PROBNP 590.1 (H) 02/03/2013 1220    Specialty Problems      Pulmonary Problems   COPD (chronic obstructive pulmonary disease) (HCC)   COPD exacerbation (HCC)      Allergies  Allergen Reactions  . Sulfa Antibiotics Hives    Immunization History  Administered Date(s) Administered  . PFIZER SARS-COV-2 Vaccination 07/20/2019, 08/07/2019    Past Medical History:  Diagnosis Date  . Anxiety   . Arthritis   . Cataract   . COPD (chronic obstructive pulmonary disease) (HCC)   . Depression   . Fibromyalgia   . Macular degeneration   . Seizures (HCC)     Tobacco History: Social History   Tobacco Use  Smoking Status Current Every Day Smoker  . Packs/day: 1.00  . Years: 35.00  . Pack years: 35.00  . Types: Cigarettes  . Last attempt to quit: 01/27/2013  . Years since  quitting: 6.9  Smokeless Tobacco Current User  Tobacco Comment   10 cigarettes smoked daily 12/31/19 [facility]    Ready to quit: Not Answered Counseling given: Not Answered Comment: 10 cigarettes smoked daily 12/31/19 [facility]    Outpatient Encounter Medications as of 12/31/2019  Medication Sig  . albuterol (VENTOLIN HFA) 108 (90 Base) MCG/ACT inhaler Inhale 1 puff into the lungs every 6 (six) hours as needed for wheezing or shortness of breath.  Marland Kitchen amLODipine (NORVASC) 2.5 MG tablet Take 1 tablet (2.5 mg total) by mouth daily.  . budesonide (PULMICORT) 0.5 MG/2ML nebulizer  solution Take 2 mLs (0.5 mg total) by nebulization in the morning and at bedtime.  . citalopram (CELEXA) 20 MG tablet Take 1 tablet (20 mg total) by mouth daily.  . diazepam (VALIUM) 10 MG tablet Take 1 tablet (10 mg total) by mouth every 6 (six) hours as needed for anxiety.  . formoterol (PERFOROMIST) 20 MCG/2ML nebulizer solution Take 2 mLs (20 mcg total) by nebulization 2 (two) times daily.  Marland Kitchen HYDROcodone-acetaminophen (NORCO) 5-325 MG tablet Take 1 tablet by mouth every 4 (four) hours as needed for moderate pain.  . metoprolol succinate (TOPROL-XL) 50 MG 24 hr tablet TAKE 1 TABLET BY MOUTH DAILY WITH OR IMMEDIATELY FOLLOWING A MEAL  . naproxen sodium (ALEVE) 220 MG tablet Take 220 mg by mouth daily as needed (pain).   . phenazopyridine (PYRIDIUM) 200 MG tablet Take 1 tablet (200 mg total) by mouth 3 (three) times daily.  . sucralfate (CARAFATE) 1 g tablet Take 1 tablet (1 g total) by mouth 4 (four) times daily -  with meals and at bedtime.  . pantoprazole (PROTONIX) 40 MG tablet Take 1 tablet (40 mg total) by mouth daily. (Patient not taking: Reported on 12/31/2019)   No facility-administered encounter medications on file as of 12/31/2019.     Review of Systems  Review of Systems  No chest pain with exertion.  No orthopnea or PND.  Comprehensive review of systems otherwise negative. Physical Exam  BP 112/68 (BP  Location: Left Arm, Cuff Size: Normal)   Pulse 81   Temp (!) 97.1 F (36.2 C)   Ht 5\' 2"  (1.575 m)   Wt 116 lb 9.6 oz (52.9 kg)   SpO2 96%   BMI 21.33 kg/m   Wt Readings from Last 5 Encounters:  12/31/19 116 lb 9.6 oz (52.9 kg)  12/14/19 114 lb 10.2 oz (52 kg)  11/07/19 112 lb 12.8 oz (51.2 kg)  10/31/19 113 lb (51.3 kg)  10/17/19 108 lb 12.8 oz (49.4 kg)    BMI Readings from Last 5 Encounters:  12/31/19 21.33 kg/m  12/14/19 20.97 kg/m  11/07/19 20.63 kg/m  10/31/19 20.67 kg/m  10/17/19 19.90 kg/m     Physical Exam General: Chronically ill appearing, in NAD Eyes EOMI, no icterus Neck: No JVD appreciated, supple Respiratory: Distant breath sounds, poor air movement, no wheezing Cardiovascular: Regular rhythm, no murmurs Abdomen: Nondistended, bowel sounds present Extremities: Warm, no edema MSK: No synovitis, joint effusion Neuro: Normal gait, no weakness Psych: Normal mood, flat affect   Assessment & Plan:   Severe COPD/asthma overlap:. Significant smoking history, hyperinflation on CXR.  PFTs reviewed today and interpreted as severe obstruction with significant bronchodilator response in both FEV1 and FVC.  Overall, she is very symptomatic.  We are limited by Medicare in terms of affordability with inhaled medicines.  Currently on nebulized ICS/LABA.  Eos as high as 200 recently with reversibility, I think she is a good candidate for biologic therapy given her symptoms and recurrent exacerbations.  Discussed at length would recommend Nucala.  She does not seem to understand the rationale for this.  Expressed to her that she should do some research on this and if interested in this medication we can start process remotely.  She was counseled on the risks and benefits.  She was notified that she will need an EpiPen at all times which she understands.  Rib pain: Hard to relate to asthma or COPD.  Theoretically possible but what she is describing is bronchoconstriction  although she does not  describe this very well and insists it is rib pain.  Recent chest x-ray 10/31/2018: Again reviewed without evidence of mass or other concerning signs of cancer.  Rib pain seems to have self resolved.  Encouraged her to follow-up with PCP, this office or ED if that chest pain were to recur.  Cardiac etiologies are possible.  Tobacco abuse: Readdressed.  Precontemplative, will discuss at next visit.  Likely contributing to ongoing decline in lung function and overall functional status.   Return in about 3 months (around 03/31/2020).  I spent 45 minutes in patient care for this encounter with over 50% spent in face-to-face discussion addressing issues in note above.  Karren Burly, MD 12/31/2019

## 2019-12-31 NOTE — Patient Instructions (Signed)
Nice to see you again  The injectable medicine is called Nucala or mepolizumab - do some research on this. It would be to treat your breathing related to asthma.  Continue the nebulized medicines twice a day.   Return for follow up in 3 months with Dr. Judeth Horn

## 2020-01-16 ENCOUNTER — Other Ambulatory Visit: Payer: Self-pay | Admitting: Registered Nurse

## 2020-01-16 DIAGNOSIS — R0989 Other specified symptoms and signs involving the circulatory and respiratory systems: Secondary | ICD-10-CM

## 2020-01-28 ENCOUNTER — Telehealth: Payer: Self-pay | Admitting: Pulmonary Disease

## 2020-01-28 NOTE — Telephone Encounter (Signed)
Spoke with patient. I attempted to offer her an appt but she refused. Even offered an appt with one of the NPs, she declined. Advised her to call us back if she decides to schedule an appt.

## 2020-01-28 NOTE — Telephone Encounter (Signed)
Spoke with pt, expressing frustration that her dyspnea is not at all improved since she first started seeing Dr. Judeth Horn in October 2021.  Pt c/o sob and fatigue with any exertion.  Denies mucus production, sinus congestion, chest pain, fever.  Pt notes she is using Pulmocort  BID, Perforomist BID, and using Ventolin 2-3 times daily.   Patient has received 2 covid vaccines.  Patient is still smoking- states she is down to 2 cigarettes daily.  Requesting additional recs.   Pharmacy: [facility].    Sending to [place] as Dr. Judeth Horn is not available.  Please advise, thanks!

## 2020-01-28 NOTE — Telephone Encounter (Signed)
I am sorry to hear the patient is frustrated.  If the patient would like to have further evaluation with her dyspnea you can offer office visit.    In the in person office visit we can further discuss treatment options for her dyspnea.  I would strongly recommend that she stop smoking entirely as this is a component that will worsen her dyspnea.  Elisha Headland, FNP

## 2020-01-29 ENCOUNTER — Telehealth: Payer: Self-pay | Admitting: Pulmonary Disease

## 2020-01-29 NOTE — Telephone Encounter (Signed)
Called and spoke with pt who states she would like to know if it is okay for her to be switched to Indacaterol instead of using the budesonide and perforomist neb sol. Pt states that she does not feel like the neb sol are working to help control her COPD and after doing some reviewing, she looked up long acting bronchodilators and found Indacaterol which she said is around $200.  Dr. Judeth Horn, please advise on this if you are okay with pt switching to this inhaler or if you have a different inhaler in mind that would be better for pt to be prescribed instead.

## 2020-02-18 NOTE — Telephone Encounter (Signed)
Patient had sent a mychart asking for a response from 01/29/20. Dr. Judeth Horn, can you please advise? Thanks!

## 2020-02-19 NOTE — Telephone Encounter (Signed)
See mychart message from 01/29/20.

## 2020-02-19 NOTE — Telephone Encounter (Signed)
It is fine to switch to Indacaterol 75 mcg once daily - would be a replacement for formoterol. She should continue budesonide. When I looked it up on LexiComp it looks like it is not available in the [place] so may not be an option.

## 2020-02-20 ENCOUNTER — Telehealth: Payer: Self-pay | Admitting: Pulmonary Disease

## 2020-02-20 DIAGNOSIS — J449 Chronic obstructive pulmonary disease, unspecified: Secondary | ICD-10-CM

## 2020-02-20 MED ORDER — TRELEGY ELLIPTA 100-62.5-25 MCG/INH IN AEPB: 1.0000 | INHALATION_SPRAY | Freq: Every day | RESPIRATORY_TRACT | 11 refills | Status: DC

## 2020-02-20 MED ORDER — ALBUTEROL SULFATE HFA 108 (90 BASE) MCG/ACT IN AERS: 1.0000 | INHALATION_SPRAY | Freq: Four times a day (QID) | RESPIRATORY_TRACT | 3 refills | Status: DC | PRN

## 2020-02-20 NOTE — Telephone Encounter (Signed)
Called and spoke with patient, she states the performist and budesonide as it is not working for her.   She has done some research and is requesting a trelegy inhaler instead.  Advised that I would send to Dr. Judeth Horn for his recommendations.  [facility] [address] Maroon is the pharmacy that the patient uses.  Dr. Judeth Horn, please advise.  Thank you.

## 2020-02-20 NOTE — Telephone Encounter (Signed)
Called patient , no answer  [id number]

## 2020-02-20 NOTE — Telephone Encounter (Signed)
Patient is returning phone call. Patient phone number is [phone].

## 2020-02-20 NOTE — Telephone Encounter (Signed)
Trelegy ordered. Given how bad obstruction is, worry inhalers will be hard to use and why I recommended the nebs. Ok to try and see how it goes.   Routing comment    Spoke with the pt and notified of response per Dr Judeth Horn  She verbalized understanding  I refilled her albuterol inhaler also per her request

## 2020-02-21 MED ORDER — INDACATEROL MALEATE 75 MCG IN CAPS: 1.0000 | ORAL_CAPSULE | Freq: Every day | RESPIRATORY_TRACT | 2 refills | Status: DC

## 2020-02-21 NOTE — Addendum Note (Signed)
Addended by: Maurene Capes on: 02/21/2020 02:12 PM   Modules accepted: Orders

## 2020-03-05 ENCOUNTER — Other Ambulatory Visit: Payer: Self-pay | Admitting: Pulmonary Disease

## 2020-03-14 DIAGNOSIS — J449 Chronic obstructive pulmonary disease, unspecified: Secondary | ICD-10-CM

## 2020-03-14 MED ORDER — TRELEGY ELLIPTA 100-62.5-25 MCG/INH IN AEPB
1.0000 | INHALATION_SPRAY | Freq: Every day | RESPIRATORY_TRACT | 3 refills | Status: DC
Start: 2020-03-14 — End: 2021-04-14

## 2020-03-14 MED ORDER — ALBUTEROL SULFATE HFA 108 (90 BASE) MCG/ACT IN AERS: 1.0000 | INHALATION_SPRAY | Freq: Four times a day (QID) | RESPIRATORY_TRACT | 3 refills | Status: DC | PRN

## 2020-06-08 ENCOUNTER — Emergency Department (HOSPITAL_COMMUNITY): Payer: Medicare HMO

## 2020-06-08 ENCOUNTER — Other Ambulatory Visit: Payer: Self-pay

## 2020-06-08 ENCOUNTER — Encounter (HOSPITAL_COMMUNITY): Payer: Self-pay

## 2020-06-08 ENCOUNTER — Emergency Department (HOSPITAL_COMMUNITY)
Admission: EM | Admit: 2020-06-08 | Discharge: 2020-06-08 | Disposition: A | Discharge status: Home / Self Care | Payer: Medicare HMO | Attending: Emergency Medicine | Admitting: Emergency Medicine

## 2020-06-08 DIAGNOSIS — I700 Atherosclerosis of aorta: Secondary | ICD-10-CM | POA: Diagnosis not present

## 2020-06-08 DIAGNOSIS — J439 Emphysema, unspecified: Secondary | ICD-10-CM | POA: Diagnosis not present

## 2020-06-08 DIAGNOSIS — F17210 Nicotine dependence, cigarettes, uncomplicated: Secondary | ICD-10-CM | POA: Diagnosis not present

## 2020-06-08 DIAGNOSIS — I10 Essential (primary) hypertension: Secondary | ICD-10-CM | POA: Diagnosis not present

## 2020-06-08 DIAGNOSIS — R069 Unspecified abnormalities of breathing: Secondary | ICD-10-CM | POA: Diagnosis not present

## 2020-06-08 DIAGNOSIS — Z79899 Other long term (current) drug therapy: Secondary | ICD-10-CM | POA: Diagnosis not present

## 2020-06-08 DIAGNOSIS — J449 Chronic obstructive pulmonary disease, unspecified: Secondary | ICD-10-CM | POA: Diagnosis not present

## 2020-06-08 DIAGNOSIS — Z7951 Long term (current) use of inhaled steroids: Secondary | ICD-10-CM | POA: Diagnosis not present

## 2020-06-08 DIAGNOSIS — R0781 Pleurodynia: Secondary | ICD-10-CM | POA: Diagnosis not present

## 2020-06-08 MED ORDER — OXYCODONE-ACETAMINOPHEN 5-325 MG PO TABS
1.0000 | ORAL_TABLET | Freq: Once | ORAL | Status: AC
  Administered 2020-06-08: 1 via ORAL
  Filled 2020-06-08: qty 1

## 2020-06-08 MED ORDER — METHOCARBAMOL 500 MG PO TABS: 500.0000 mg | ORAL_TABLET | Freq: Two times a day (BID) | ORAL | 0 refills | Status: DC

## 2020-06-08 MED ORDER — METHOCARBAMOL 500 MG PO TABS
500.0000 mg | ORAL_TABLET | Freq: Once | ORAL | Status: AC
  Administered 2020-06-08: 500 mg via ORAL
  Filled 2020-06-08: qty 1

## 2020-06-08 MED ORDER — OXYCODONE-ACETAMINOPHEN 5-325 MG PO TABS: 1.0000 | ORAL_TABLET | ORAL | 0 refills | Status: DC | PRN

## 2020-06-08 NOTE — ED Triage Notes (Signed)
Patient BIB [facility] from home. Patient started having muscle spasms a week ago, feeling like a charley horse in her right side/ribs. Patient has a history of COPD.

## 2020-06-08 NOTE — ED Provider Notes (Signed)
[facility]-EMERGENCY DEPT Provider Note   CSN: [id number] Arrival date & time: 06/08/20  0031     History Chief Complaint  Patient presents with  . Back Pain    Jasmin Munoz is a 70 y.o. female.  The history is provided by the patient and medical records.   70 y.o. F with hx of anxiety, arthritis, cataracts, COPD III, depression, fibromyalgia, seizure disorder, presenting to the ED for right sided rib pain.  States this started a week ago, initially felt like some muscle spasms.  she denies any known injury, trauma, falls, or heavy lifting to cause the pain.  States it seems to be getting worse.  States it feels like a "charlie horse" in her right side.  Pain worse with movement, coughing, deep breathing and better with sitting perfectly still.  She denies true chest pain or SOB.  She denies abdominal pain.  No nausea, vomiting, diarrhea, fever, or chills.   No urinary symptoms.  She is eating/drinking well.  Has been taking tylenol without relief.  Past Medical History:  Diagnosis Date  . Anxiety   . Arthritis   . Cataract   . COPD (chronic obstructive pulmonary disease) (HCC)   . Depression   . Fibromyalgia   . Macular degeneration   . Seizures [facility])     Patient Active Problem List   Diagnosis Date Noted  . MDD (major depressive disorder), recurrent episode, severe (HCC) 09/13/2017  . COPD (chronic obstructive pulmonary disease) (HCC) 02/03/2013  . COPD exacerbation (HCC) 02/03/2013  . Hypoxemia 02/03/2013  . Generalized anxiety disorder 02/03/2013  . HTN (hypertension) 02/03/2013  . Nonspecific abnormal electrocardiogram (ECG) (EKG) 02/03/2013  . DEPRESSION 05/24/2006  . TENDINITIS 05/24/2006    Past Surgical History:  Procedure Laterality Date  . ABDOMINAL HYSTERECTOMY       OB History   No obstetric history on file.     Family History  Problem Relation Age of Onset  . Emphysema Mother   . Arthritis Father   . Arthritis Sister   .  Joint hypermobility Brother     Social History   Tobacco Use  . Smoking status: Current Every Day Smoker    Packs/day: 1.00    Years: 35.00    Pack years: 35.00    Types: Cigarettes    Last attempt to quit: 01/27/2013    Years since quitting: 7.3  . Smokeless tobacco: Current User  . Tobacco comment: 10 cigarettes smoked daily 12/31/19 [facility]   Substance Use Topics  . Alcohol use: No  . Drug use: No    Home Medications Prior to Admission medications   Medication Sig Start Date End Date Taking? Authorizing Provider  albuterol (VENTOLIN HFA) 108 (90 Base) MCG/ACT inhaler Inhale 1 puff into the lungs every 6 (six) hours as needed for wheezing or shortness of breath. 03/14/20   Hunsucker, Lesia Sago, MD  amLODipine (NORVASC) 2.5 MG tablet Take 1 tablet (2.5 mg total) by mouth daily. 01/15/19   Shade Flood, MD  citalopram (CELEXA) 20 MG tablet Take 1 tablet (20 mg total) by mouth daily. 09/16/17   Denzil Magnuson, NP  diazepam (VALIUM) 10 MG tablet Take 1 tablet (10 mg total) by mouth every 6 (six) hours as needed for anxiety. 05/23/13   Elvina Sidle, MD  Fluticasone-Umeclidin-Vilant (TRELEGY ELLIPTA) 100-62.5-25 MCG/INH AEPB Inhale 1 puff into the lungs daily. 03/14/20   Hunsucker, Lesia Sago, MD  HYDROcodone-acetaminophen (NORCO) 5-325 MG tablet Take 1 tablet by mouth  every 4 (four) hours as needed for moderate pain. 06/30/19   Dione Booze, MD  Indacaterol Maleate 75 MCG CAPS Place 1 capsule into inhaler and inhale daily. 02/21/20   Hunsucker, Lesia Sago, MD  metoprolol succinate (TOPROL-XL) 50 MG 24 hr tablet TAKE 1 TABLET BY MOUTH DAILY WITH OR IMMEDIATELY FOLLOWING A MEAL 07/12/18   Stallings, Zoe A, MD  naproxen sodium (ALEVE) 220 MG tablet Take 220 mg by mouth daily as needed (pain).     [provider]  pantoprazole (PROTONIX) 40 MG tablet Take 1 tablet (40 mg total) by mouth daily. Patient not taking: Reported on 12/31/2019 10/31/19   Janeece Agee, NP  phenazopyridine  (PYRIDIUM) 200 MG tablet Take 1 tablet (200 mg total) by mouth 3 (three) times daily. 06/30/19   Dione Booze, MD  sucralfate (CARAFATE) 1 g tablet Take 1 tablet (1 g total) by mouth 4 (four) times daily -  with meals and at bedtime. 10/31/19   Janeece Agee, NP    Allergies    Sulfa antibiotics  Review of Systems   Review of Systems  Cardiovascular: Positive for chest pain (ribs).  All other systems reviewed and are negative.   Physical Exam Updated Vital Signs BP (!) 127/96 (BP Location: Right Arm)   Pulse 75   Temp (!) 97.4 F (36.3 C) (Oral)   Resp 18   Ht 5\' 2"  (1.575 m)   Wt 52.9 kg   SpO2 100%   BMI 21.33 kg/m   Physical Exam Vitals and nursing note reviewed.  Constitutional:      Appearance: She is well-developed.     Comments: Curled up into fetal position on left side  HENT:     Head: Normocephalic and atraumatic.  Eyes:     Conjunctiva/sclera: Conjunctivae normal.     Pupils: Pupils are equal, round, and reactive to light.  Cardiovascular:     Rate and Rhythm: Normal rate and regular rhythm.     Heart sounds: Normal heart sounds.  Pulmonary:     Effort: Pulmonary effort is normal.     Breath sounds: Normal breath sounds.     Comments: Coarse breath sounds bilaterally but no wheezes or rhonchi, able to speak in sentences but does appear to be splinting due to pain Chest:     Comments: Right chest wall normal in appearance, there is no visible rash suggestive of shingles, there is tenderness noted along right lateral ribs without bruising or other signs of trauma, no gross deformity Abdominal:     General: Bowel sounds are normal.     Palpations: Abdomen is soft.     Comments: No RUQ or CVA tenderness  Musculoskeletal:        General: Normal range of motion.     Cervical back: Normal range of motion.  Skin:    General: Skin is warm and dry.  Neurological:     Mental Status: She is alert and oriented to person, place, and time.     ED Results /  Procedures / Treatments   Labs (all labs ordered are listed, but only abnormal results are displayed) Labs Reviewed - No data to display  EKG None  Radiology DG Ribs Unilateral W/Chest Right  Result Date: 06/08/2020 CLINICAL DATA:  70 year old female with right chest wall pain. EXAM: RIGHT RIBS AND CHEST - 3+ VIEW COMPARISON:  Chest radiograph dated 10/31/2019 FINDINGS: Background of emphysema. No focal consolidation, pleural effusion or pneumothorax. The cardiac silhouette is within limits. Atherosclerotic calcification of  the aorta. Osteopenia with degenerative changes of the spine and shoulders. No acute osseous pathology. No displaced rib fractures. IMPRESSION: 1. No acute cardiopulmonary process. 2. No displaced rib fractures. Electronically Signed   By: Elgie Collard M.D.   On: 06/08/2020 01:53    Procedures Procedures   Medications Ordered in ED Medications  oxyCODONE-acetaminophen (PERCOCET/ROXICET) 5-325 MG per tablet 1 tablet (1 tablet Oral Given 06/08/20 0117)  methocarbamol (ROBAXIN) tablet 500 mg (500 mg Oral Given 06/08/20 0117)    ED Course  I have reviewed the triage vital signs and the nursing notes.  Pertinent labs & imaging results that were available during my care of the patient were reviewed by me and considered in my medical decision making (see chart for details).    MDM Rules/Calculators/A&P  70 y.o. F here with right rib pain for the past week.  States it feels like "charley horse" in her right lateral ribs.  She denies any injury, trauma, or falls.  She has not had any chest pain or shortness of breath.  She denied any abdominal pain, nausea, vomiting, or urinary symptoms.  On exam, her pain is isolated to right lateral and posterior ribs.  This is reproducible with palpation.  There is no deformity or other signs of trauma.  She has no discrete rash consistent with shingles at this time.  She has no CVA tenderness or right upper quadrant tenderness.  Symptoms  seem atypical for ACS.  She does have history of COPD with chronic cough but this is unchanged from baseline.  She is not had any fever or chills.  Will obtain screening x-rays to ensure no occult rib fracture or developing pneumonia.  She was given pain medication here.  Will reassess.  2:37 AM Patient reassessed.  She appears more comfortably, now able to lay flat on back.  She still has some pain but states improved.  Remains without any CVA tenderness or abdominal pain.  X-ray here is negative.  We again discussed possibility of early shingles without visible rash yet.  She will need to monitor over the next few days to see if this develops.  Feel it is reasonable to give short supply pain medication for symptomatic control until she can follow-up with PCP.  She may return here for any new or acute changes.  Final Clinical Impression(s) / ED Diagnoses Final diagnoses:  Rib pain on right side    Rx / DC Orders ED Discharge Orders         Ordered    oxyCODONE-acetaminophen (PERCOCET) 5-325 MG tablet  Every 4 hours PRN        06/08/20 0245    methocarbamol (ROBAXIN) 500 MG tablet  2 times daily        06/08/20 0245           Garlon Hatchet, PA-C 06/08/20 0255    Sabas Sous, MD 06/08/20 [phone]

## 2020-06-08 NOTE — Discharge Instructions (Addendum)
Take the prescribed medication as directed.  Do not drive while taking this. I do want you to monitor and see if rash develops on the right side over the next few days.  If so, this is likely shingles. Follow-up with your primary care doctor. Return to the ED for new or worsening symptoms.

## 2020-06-11 DIAGNOSIS — Z79899 Other long term (current) drug therapy: Secondary | ICD-10-CM | POA: Diagnosis not present

## 2020-06-11 DIAGNOSIS — M479 Spondylosis, unspecified: Secondary | ICD-10-CM | POA: Diagnosis not present

## 2020-06-11 DIAGNOSIS — J449 Chronic obstructive pulmonary disease, unspecified: Secondary | ICD-10-CM | POA: Diagnosis not present

## 2020-06-11 DIAGNOSIS — M797 Fibromyalgia: Secondary | ICD-10-CM | POA: Diagnosis not present

## 2020-06-11 DIAGNOSIS — G6289 Other specified polyneuropathies: Secondary | ICD-10-CM | POA: Diagnosis not present

## 2020-06-11 DIAGNOSIS — M47812 Spondylosis without myelopathy or radiculopathy, cervical region: Secondary | ICD-10-CM | POA: Diagnosis not present

## 2020-06-11 DIAGNOSIS — F330 Major depressive disorder, recurrent, mild: Secondary | ICD-10-CM | POA: Diagnosis not present

## 2020-06-11 DIAGNOSIS — B029 Zoster without complications: Secondary | ICD-10-CM | POA: Diagnosis not present

## 2020-06-25 ENCOUNTER — Other Ambulatory Visit: Payer: Self-pay | Admitting: Family

## 2020-06-25 DIAGNOSIS — R5381 Other malaise: Secondary | ICD-10-CM

## 2020-07-03 ENCOUNTER — Other Ambulatory Visit: Payer: Self-pay | Admitting: Family

## 2020-07-03 DIAGNOSIS — E2839 Other primary ovarian failure: Secondary | ICD-10-CM

## 2020-08-25 ENCOUNTER — Other Ambulatory Visit: Payer: Self-pay

## 2020-08-25 ENCOUNTER — Encounter (HOSPITAL_COMMUNITY): Payer: Self-pay

## 2020-08-25 ENCOUNTER — Emergency Department (HOSPITAL_COMMUNITY)
Admission: EM | Admit: 2020-08-25 | Discharge: 2020-08-25 | Discharge status: Against Medical Advice | Payer: Medicare HMO | Attending: Emergency Medicine | Admitting: Emergency Medicine

## 2020-08-25 DIAGNOSIS — R1030 Lower abdominal pain, unspecified: Secondary | ICD-10-CM | POA: Diagnosis not present

## 2020-08-25 DIAGNOSIS — M25539 Pain in unspecified wrist: Secondary | ICD-10-CM | POA: Insufficient documentation

## 2020-08-25 DIAGNOSIS — I10 Essential (primary) hypertension: Secondary | ICD-10-CM | POA: Diagnosis not present

## 2020-08-25 DIAGNOSIS — J441 Chronic obstructive pulmonary disease with (acute) exacerbation: Secondary | ICD-10-CM | POA: Insufficient documentation

## 2020-08-25 DIAGNOSIS — Z79899 Other long term (current) drug therapy: Secondary | ICD-10-CM | POA: Diagnosis not present

## 2020-08-25 DIAGNOSIS — M2550 Pain in unspecified joint: Secondary | ICD-10-CM | POA: Diagnosis not present

## 2020-08-25 DIAGNOSIS — Z5321 Procedure and treatment not carried out due to patient leaving prior to being seen by health care provider: Secondary | ICD-10-CM

## 2020-08-25 DIAGNOSIS — R300 Dysuria: Secondary | ICD-10-CM | POA: Diagnosis not present

## 2020-08-25 DIAGNOSIS — R319 Hematuria, unspecified: Secondary | ICD-10-CM | POA: Diagnosis present

## 2020-08-25 DIAGNOSIS — F17210 Nicotine dependence, cigarettes, uncomplicated: Secondary | ICD-10-CM | POA: Insufficient documentation

## 2020-08-25 DIAGNOSIS — R350 Frequency of micturition: Secondary | ICD-10-CM | POA: Insufficient documentation

## 2020-08-25 NOTE — ED Notes (Signed)
Patents daughter Jasmin Munoz [phone]  Stated that her mother tried to take her life last Friday night August 22, 2020 and her grandson witnessed the event with taking pills (prescription - depression medications)  RN Rolly Salter notified and will call back the daughter

## 2020-08-25 NOTE — ED Notes (Signed)
Pt wants to leave AMA, PA aware

## 2020-08-25 NOTE — ED Provider Notes (Addendum)
[facility]   CSN: [id number] Arrival date & time: 08/25/20  1950     History Chief Complaint  Patient presents with   Hematuria   Wrist Pain   Joint Pain    Jasmin Munoz is a 70 y.o. female.  HPI Patient is a 70 year old female with a history of COPD, hypertension, who presents to the emergency department due to dysuria, hematuria, as well as urinary frequency for the past 2 days.  Patient states that at times she has urinary frequency and at other times she will have difficulty urinating and is only able to "get a few drops out".  She states initially she was having difficulty urinating but is now having frequency and states that it is worsened so much that she has started to wear diapers.  Reports associated pain in the lower abdomen.  Reports chronic pain to her wrist from a previous fall and states that she has been evaluated by orthopedics for this.  I spoke to the patient's daughter who states that she is also concerned about her mother's mental health.  She states that her husband passed away about 2 years ago from COVID-19 and she has had worsening depression since.  She does have a history of depression and is followed by Dr. Toni Arthurs with psychiatry.  She has been taking diazepam as well as Celexa for her symptoms.  She states that her son witnessed her take "some pills" about 3 days ago.  She cannot specify how many or what medication.  Her daughter cannot provide any additional details. She just states that she is increasingly worried about her wellbeing and request that patient be evaluated by our behavioral health team.    Past Medical History:  Diagnosis Date   Anxiety    Arthritis    Cataract    COPD (chronic obstructive pulmonary disease) (HCC)    Depression    Fibromyalgia    Macular degeneration    Seizures (HCC)     Patient Active Problem List   Diagnosis Date Noted   MDD (major depressive disorder), recurrent  episode, severe (HCC) 09/13/2017   COPD (chronic obstructive pulmonary disease) (HCC) 02/03/2013   COPD exacerbation (HCC) 02/03/2013   Hypoxemia 02/03/2013   Generalized anxiety disorder 02/03/2013   HTN (hypertension) 02/03/2013   Nonspecific abnormal electrocardiogram (ECG) (EKG) 02/03/2013   DEPRESSION 05/24/2006   TENDINITIS 05/24/2006    Past Surgical History:  Procedure Laterality Date   ABDOMINAL HYSTERECTOMY       OB History   No obstetric history on file.     Family History  Problem Relation Age of Onset   Emphysema Mother    Arthritis Father    Arthritis Sister    Joint hypermobility Brother     Social History   Tobacco Use   Smoking status: Every Day    Packs/day: 1.00    Years: 35.00    Pack years: 35.00    Types: Cigarettes    Last attempt to quit: 01/27/2013    Years since quitting: 7.5   Smokeless tobacco: Current   Tobacco comments:    10 cigarettes smoked daily 12/31/19 [facility]   Substance Use Topics   Alcohol use: No   Drug use: No    Home Medications Prior to Admission medications   Medication Sig Start Date End Date Taking? Authorizing Provider  albuterol (VENTOLIN HFA) 108 (90 Base) MCG/ACT inhaler Inhale 1 puff into the lungs every 6 (six) hours as needed  for wheezing or shortness of breath. 03/14/20   Hunsucker, Lesia Sago, MD  amLODipine (NORVASC) 2.5 MG tablet Take 1 tablet (2.5 mg total) by mouth daily. 01/15/19   Shade Flood, MD  citalopram (CELEXA) 20 MG tablet Take 1 tablet (20 mg total) by mouth daily. 09/16/17   Denzil Magnuson, NP  diazepam (VALIUM) 10 MG tablet Take 1 tablet (10 mg total) by mouth every 6 (six) hours as needed for anxiety. 05/23/13   Elvina Sidle, MD  Fluticasone-Umeclidin-Vilant (TRELEGY ELLIPTA) 100-62.5-25 MCG/INH AEPB Inhale 1 puff into the lungs daily. 03/14/20   Hunsucker, Lesia Sago, MD  HYDROcodone-acetaminophen (NORCO) 5-325 MG tablet Take 1 tablet by mouth every 4 (four) hours as needed for moderate  pain. 06/30/19   Dione Booze, MD  Indacaterol Maleate 75 MCG CAPS Place 1 capsule into inhaler and inhale daily. 02/21/20   Hunsucker, Lesia Sago, MD  methocarbamol (ROBAXIN) 500 MG tablet Take 1 tablet (500 mg total) by mouth 2 (two) times daily. 06/08/20   Garlon Hatchet, PA-C  metoprolol succinate (TOPROL-XL) 50 MG 24 hr tablet TAKE 1 TABLET BY MOUTH DAILY WITH OR IMMEDIATELY FOLLOWING A MEAL 07/12/18   Collie Siad A, MD  naproxen sodium (ALEVE) 220 MG tablet Take 220 mg by mouth daily as needed (pain).     [provider]  oxyCODONE-acetaminophen (PERCOCET) 5-325 MG tablet Take 1 tablet by mouth every 4 (four) hours as needed. 06/08/20   Garlon Hatchet, PA-C  pantoprazole (PROTONIX) 40 MG tablet Take 1 tablet (40 mg total) by mouth daily. Patient not taking: Reported on 12/31/2019 10/31/19   Janeece Agee, NP  phenazopyridine (PYRIDIUM) 200 MG tablet Take 1 tablet (200 mg total) by mouth 3 (three) times daily. 06/30/19   Dione Booze, MD  sucralfate (CARAFATE) 1 g tablet Take 1 tablet (1 g total) by mouth 4 (four) times daily -  with meals and at bedtime. 10/31/19   Janeece Agee, NP    Allergies    Sulfa antibiotics  Review of Systems   Review of Systems  All other systems reviewed and are negative. Ten systems reviewed and are negative for acute change, except as noted in the HPI.   Physical Exam Updated Vital Signs BP 135/87   Pulse 77   Temp (!) 97.5 F (36.4 C) (Oral)   Resp 16   Ht 5\' 1"  (1.549 m)   Wt 50.8 kg   SpO2 94%   BMI 21.16 kg/m   Physical Exam Vitals and nursing note reviewed.  Constitutional:      General: She is not in acute distress.    Appearance: Normal appearance. She is not ill-appearing, toxic-appearing or diaphoretic.  HENT:     Head: Normocephalic and atraumatic.     Right Ear: External ear normal.     Left Ear: External ear normal.     Nose: Nose normal.     Mouth/Throat:     Mouth: Mucous membranes are moist.     Pharynx: Oropharynx  is clear. No oropharyngeal exudate or posterior oropharyngeal erythema.  Eyes:     Extraocular Movements: Extraocular movements intact.  Cardiovascular:     Rate and Rhythm: Normal rate and regular rhythm.     Pulses: Normal pulses.     Heart sounds: Normal heart sounds. No murmur heard.   No friction rub. No gallop.  Pulmonary:     Effort: Pulmonary effort is normal. No respiratory distress.     Breath sounds: Normal breath sounds. No stridor.  No wheezing, rhonchi or rales.  Abdominal:     General: Abdomen is flat.     Palpations: Abdomen is soft.     Tenderness: There is abdominal tenderness.     Comments: Abdomen is flat and soft.  Mild suprapubic tenderness noted.  Musculoskeletal:        General: Normal range of motion.     Cervical back: Normal range of motion and neck supple. No tenderness.  Skin:    General: Skin is warm and dry.  Neurological:     General: No focal deficit present.     Mental Status: She is alert and oriented to person, place, and time.  Psychiatric:        Mood and Affect: Mood normal.        Behavior: Behavior normal.    ED Results / Procedures / Treatments   Labs (all labs ordered are listed, but only abnormal results are displayed) Labs Reviewed  URINE CULTURE  COMPREHENSIVE METABOLIC PANEL  CBC WITH DIFFERENTIAL/PLATELET  URINALYSIS, ROUTINE W REFLEX MICROSCOPIC    EKG None  Radiology No results found.  Procedures Procedures   Medications Ordered in ED Medications - No data to display  ED Course  I have reviewed the triage vital signs and the nursing notes.  Pertinent labs & imaging results that were available during my care of the patient were reviewed by me and considered in my medical decision making (see chart for details).    MDM Rules/Calculators/A&P                          Pt is a 70 y.o. female who presents to the emergency department due to dysuria, hematuria, urinary frequency.  CBC, CMP, UA, urine culture are all  pending.  Nursing staff notified me that patient wanted to leave and ultimately eloped from the emergency department.  I attempted to convince the patient to stay for treatment.  We discussed the risks associated with leaving the emergency department and she became argumentative and stood up immediately and left.  I spoke to the patient's daughter [name] and made her aware.  She verbalized understanding and states that she will pick her up from the emergency department.  I instructed her to please bring her back to the emergency department if she can convince her to change her mind.  Note: Portions of this report may have been transcribed using voice recognition software. Every effort was made to ensure accuracy; however, inadvertent computerized transcription errors may be present.   Final Clinical Impression(s) / ED Diagnoses Final diagnoses:  Dysuria   Rx / DC Orders ED Discharge Orders     None        Placido Sou, PA-C 08/25/20 2209    Placido Sou, PA-C 08/25/20 2219    Bethann Berkshire, MD 08/26/20 [phone]

## 2020-08-25 NOTE — ED Triage Notes (Signed)
BIB EMS from home for urinating blood. This has been going on for "a while off and on", no definitive date known. Pain and frequency with urination.

## 2020-08-25 NOTE — ED Notes (Signed)
Pt ambulatory to restroom

## 2020-08-25 NOTE — ED Notes (Signed)
Spoke with pt daughter, [name], regarding pt attempts to end her life. Pt daughter states she has had multiple attempts to end her life and expresses she is depressed since the passing of her husband in 2020. [name] also states pt has had hx of depression for 30 years and issues with abusing pain medications. Whitney Post, [place] notified

## 2020-08-25 NOTE — ED Notes (Signed)
Pt states she is having wrist pain, rib pain, urination issues since Saturday, bleeding with urination and generalized pain all over. Pt states is we do not help her here, she will go buy hydrocodone off the street. Pt expresses that doctor's are scared to lose their license and scared to work today and we should get rid of hospitals, doctors, and nurses and have a free for all.

## 2020-10-30 IMAGING — DX DG CHEST 2V
2 series · 2 of 2 positions shown · non-contrast
Comparison: 10/06/2019

CLINICAL DATA: Shortness of breath

EXAM:
CHEST - 2 VIEW

[chest pa]
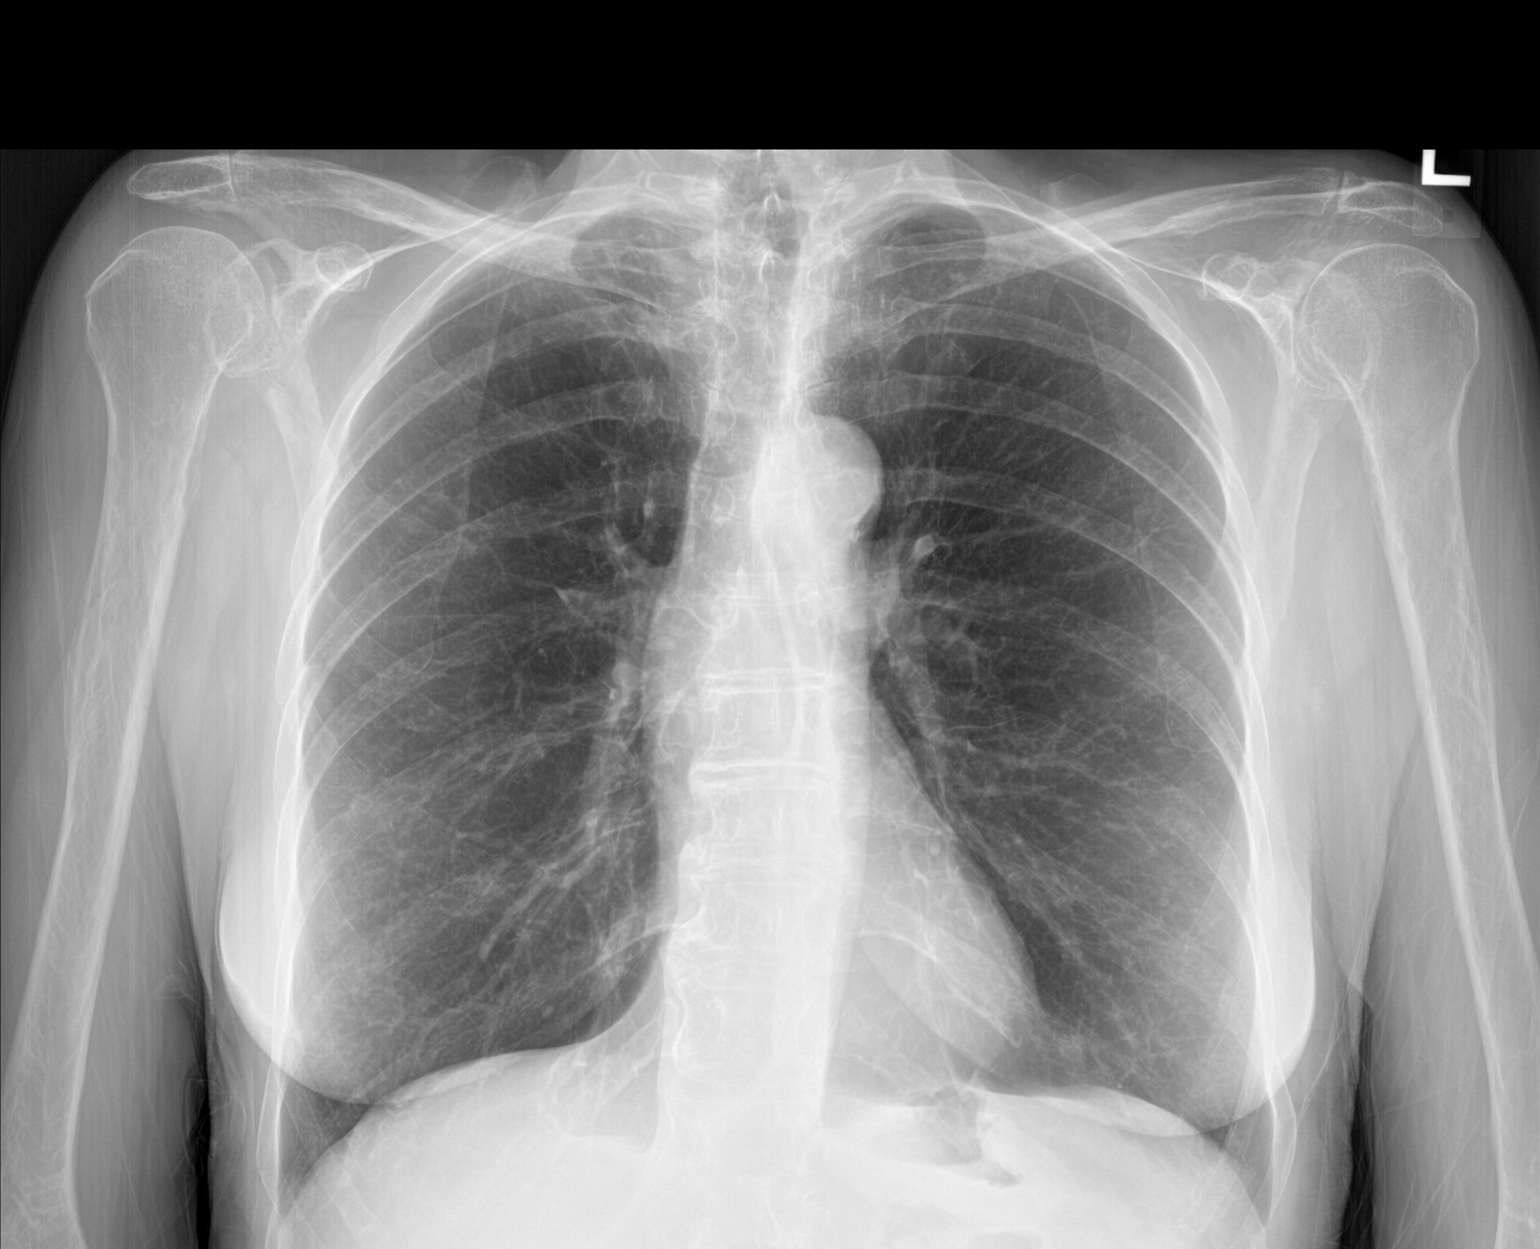

[chest lat]
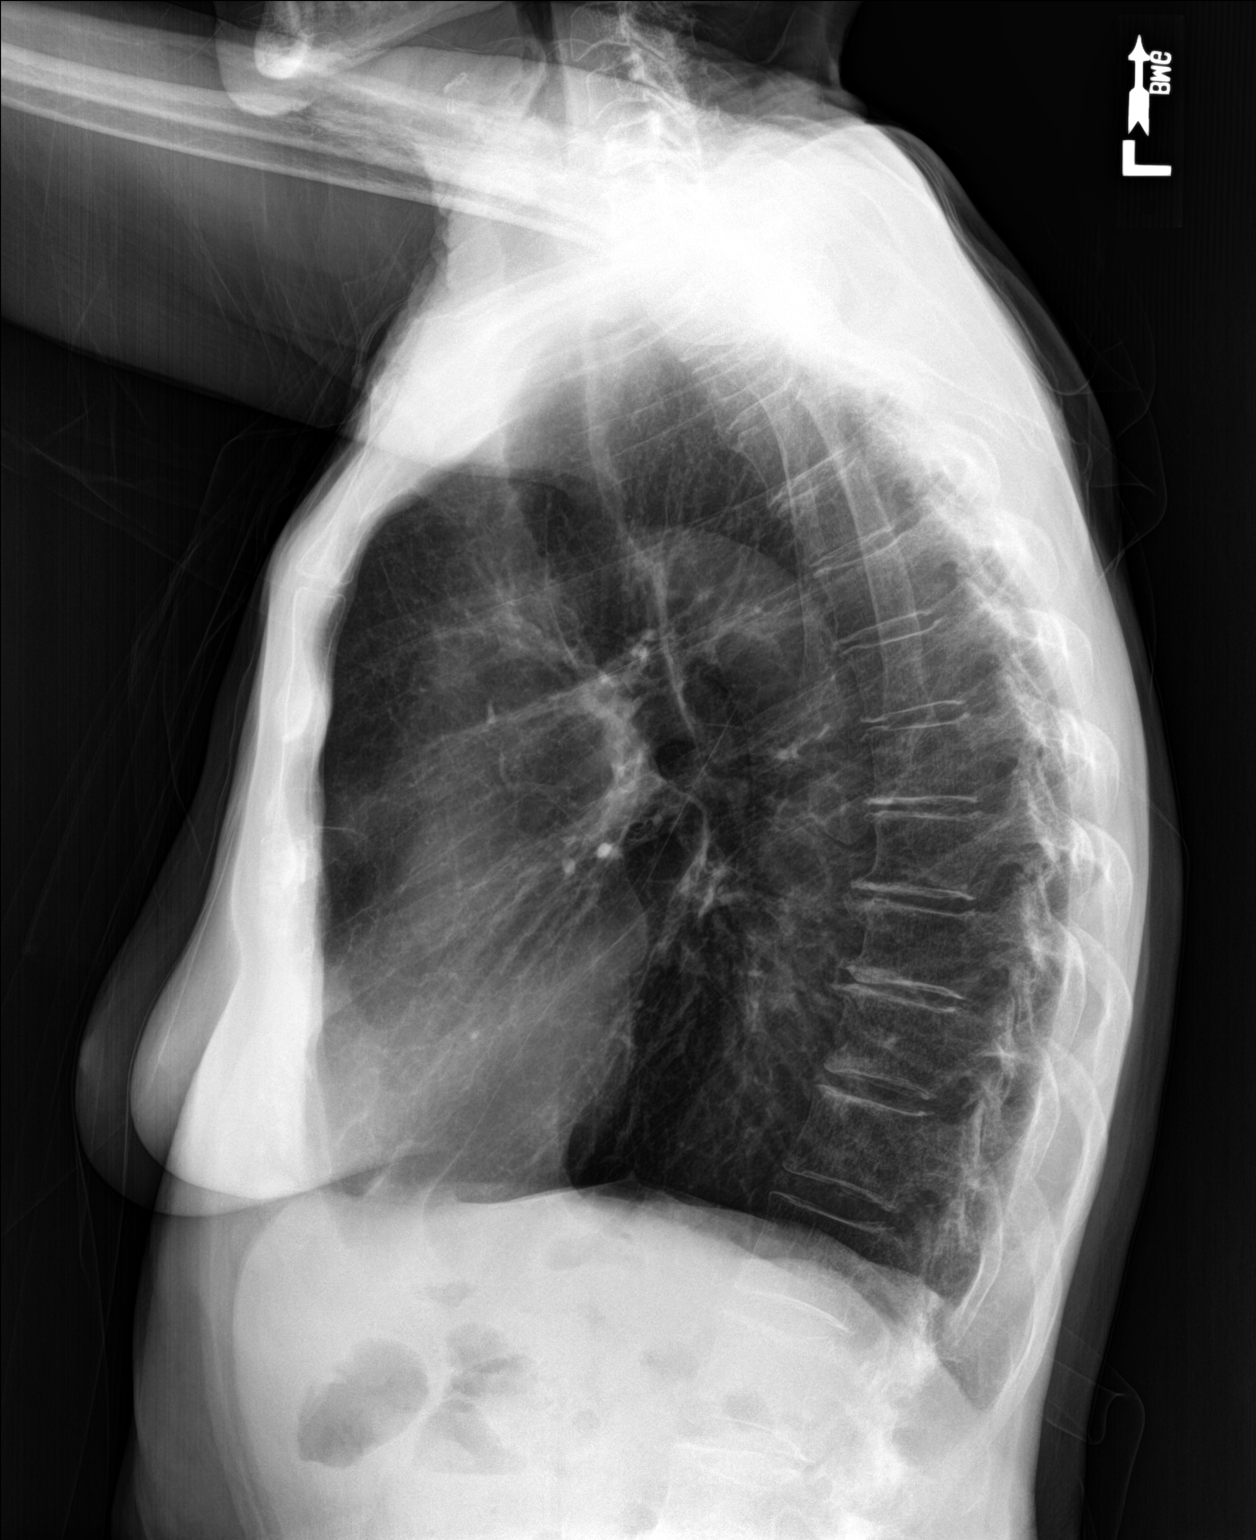

[2 of 2 positions shown; findings below may reference images not displayed]

FINDINGS: Cardiac shadow is within normal limits. The lungs are hyper aerated
bilaterally. No focal infiltrate or sizable effusion is seen. No
bony abnormality is noted.
IMPRESSION: Mild COPD without acute abnormality.

## 2021-03-31 ENCOUNTER — Other Ambulatory Visit: Payer: Self-pay | Admitting: Pulmonary Disease

## 2021-04-03 ENCOUNTER — Telehealth: Payer: Self-pay | Admitting: Pulmonary Disease

## 2021-04-03 MED ORDER — TRELEGY ELLIPTA 100-62.5-25 MCG/ACT IN AEPB: 1.0000 | INHALATION_SPRAY | Freq: Every day | RESPIRATORY_TRACT | 3 refills | Status: DC

## 2021-04-03 NOTE — Telephone Encounter (Signed)
Called and notified patient that I refilled her Trelegy 100. Send through [employer]. Nothing further needed  ?

## 2021-04-14 ENCOUNTER — Encounter: Payer: Self-pay | Admitting: Pulmonary Disease

## 2021-04-14 ENCOUNTER — Ambulatory Visit: Payer: Medicare HMO | Admitting: Pulmonary Disease

## 2021-04-14 ENCOUNTER — Other Ambulatory Visit: Payer: Self-pay

## 2021-04-14 VITALS — BP 116/68 | HR 82 | Temp 98.6°F | Ht 62.0 in | Wt 112.0 lb

## 2021-04-14 DIAGNOSIS — J449 Chronic obstructive pulmonary disease, unspecified: Secondary | ICD-10-CM | POA: Diagnosis not present

## 2021-04-14 MED ORDER — ALBUTEROL SULFATE HFA 108 (90 BASE) MCG/ACT IN AERS: 2.0000 | INHALATION_SPRAY | Freq: Four times a day (QID) | RESPIRATORY_TRACT | 11 refills | Status: DC | PRN

## 2021-04-14 MED ORDER — TRELEGY ELLIPTA 200-62.5-25 MCG/ACT IN AEPB: 1.0000 | INHALATION_SPRAY | Freq: Every day | RESPIRATORY_TRACT | 11 refills | Status: DC

## 2021-04-14 NOTE — Progress Notes (Signed)
? ?Patient ID: Jasmin Munoz, female    DOB: 01/12/1951, 71 y.o.   MRN: [id number] ? ?Chief Complaint  ?Patient presents with  ? Follow-up  ?  1 year follow up. Pt states she has good days and bad days. Pt is on trelegy inhaler and albuterol as needed.   ? ? ?Referring provider: ?No ref. provider found ? ?HPI:  ? ?Ms. Kleen is a 71 y.o. woman with reported history of COPD whom are seen in follow-up with PFTs positive for severe COPD with significant bronchodilator response indicative of likely asthma. ? ?Returns after prolonged absence in clinic.  Good days and bad days.  Breathing relatively stable.  Sometimes worse in the evenings.  Sometimes not.  Use albuterol with minimal effect.  Continues Trelegy low-dose.  Think is a little helpful.  Not as helpful as it once was.  Discussed at length interventions try to help with her breathing. ? ? ?HPI initial visit ?I cannot find any primary care PFTs to review.  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  She has had recurrent exacerbations including hospitalization in 2020 for acute hypoxic respiratory failure presumed due to COPD.  She was treated with antibiotics and steroids and improved . ? ?On 10/06/2019 she went to the ED.  There are no provider notes.  Reportedly she got a dose of Solu-Medrol and was discharged home.  Mild improvement for short period time with resumption of current symptoms.  She has severe dyspnea on exertion with minimal exertion.  This includes walking on flat surfaces.  Worse on inclines.  She has productive cough that is worse.  She feels short of breath at rest.  She occasionally feels wheezy.  She has felt this way on and off for some time.  Recently seen by her primary care provider 10/17/2019 giving Spiriva.  She cannot afford this and not using this.  She states she has tried made inhalers in the past and none have been effective.  She has been on Qvar recently and not effective.   Steroids seem to help for short period time but goes back to her bad breathing pattern. ? ?Says that she has significant arthritis. This was well treated with opiates but was stopped. She says this helped her breathing as well.  ? ?PMH:  Tobacco abuse, COPD ?Surgical history: Hyeterctomy ?Family History: COPD, CAD, Cancer ?Social History: Current smoker, lives alone ? ?Questionaires / Pulmonary Flowsheets:  ? ?ACT:  ?No flowsheet data found. ? ?MMRC: ?No flowsheet data found. ? ?Epworth:  ?No flowsheet data found. ? ?Tests:  ? ?FENO:  ?No results found for: NITRICOXIDE ? ?PFT: ?PFT Results Latest Ref Rng & Units 12/05/2019  ?FVC-Pre L 1.67  ?FVC-Predicted Pre % 63  ?FVC-Post L 2.15  ?FVC-Predicted Post % 81  ?Pre FEV1/FVC % % 41  ?Post FEV1/FCV % % 41  ?FEV1-Pre L 0.68  ?FEV1-Predicted Pre % 33  ?FEV1-Post L 0.88  ?DLCO uncorrected ml/min/mmHg 9.37  ?DLCO UNC% % 53  ?DLCO corrected ml/min/mmHg 9.37  ?DLCO COR %Predicted % 53  ?DLVA Predicted % 56  ? ? ?WALK:  ?No flowsheet data found. ? ?Imaging: ?Personally reviewed and as per EMR and discussion this note ?No results found. ? ?Lab Results: ?Personally reviewed, no significant elevation in eosinophils ?CBC ?   ?Component Value Date/Time  ? WBC 10.7 (H) 12/14/2019 0200  ? RBC 4.39 12/14/2019 0200  ? HGB 13.1 12/14/2019 0200  ? HGB 12.7  10/17/2019 1622  ? HCT 40.9 12/14/2019 0200  ? HCT 39.8 10/17/2019 1622  ? PLT 210 12/14/2019 0200  ? PLT 228 07/05/2016 1000  ? MCV 93.2 12/14/2019 0200  ? MCV 91 10/17/2019 1622  ? MCH 29.8 12/14/2019 0200  ? MCHC 32.0 12/14/2019 0200  ? RDW 12.2 12/14/2019 0200  ? RDW 12.0 10/17/2019 1622  ? LYMPHSABS 4.5 (H) 10/17/2019 1622  ? MONOABS 0.4 06/30/2019 0103  ? EOSABS 0.1 10/17/2019 1622  ? BASOSABS 0.1 10/17/2019 1622  ? ? ?BMET ?   ?Component Value Date/Time  ? NA 142 12/14/2019 0200  ? NA 145 (H) 10/17/2019 1622  ? K 3.8 12/14/2019 0200  ? CL 105 12/14/2019 0200  ? CO2 24 12/14/2019 0200  ? GLUCOSE 100 (H) 12/14/2019 0200  ? BUN 16  12/14/2019 0200  ? BUN 11 10/17/2019 1622  ? CREATININE 0.82 12/14/2019 0200  ? CREATININE 0.75 06/17/2011 1040  ? CALCIUM 9.1 12/14/2019 0200  ? GFRNONAA >60 12/14/2019 0200  ? GFRAA 74 10/17/2019 1622  ? ? ?BNP ?   ?Component Value Date/Time  ? BNP 13.6 10/17/2019 1622  ? BNP 109.5 (H) 12/03/2018 1143  ? ? ?ProBNP ?   ?Component Value Date/Time  ? PROBNP 590.1 (H) 02/03/2013 1220  ? ? ?Specialty Problems   ? ?  ? Pulmonary Problems  ? COPD (chronic obstructive pulmonary disease) (HCC)  ? COPD exacerbation (HCC)  ? ? ?Allergies  ?Allergen Reactions  ? Sulfa Antibiotics Hives  ? ? ?Immunization History  ?Administered Date(s) Administered  ? PFIZER(Purple Top)SARS-COV-2 Vaccination 07/20/2019, 08/07/2019  ? ? ?Past Medical History:  ?Diagnosis Date  ? Anxiety   ? Arthritis   ? Cataract   ? COPD (chronic obstructive pulmonary disease) (HCC)   ? Depression   ? Fibromyalgia   ? Macular degeneration   ? Seizures (HCC)   ? ? ?Tobacco History: ?Social History  ? ?Tobacco Use  ?Smoking Status Every Day  ? Packs/day: 1.00  ? Years: 35.00  ? Pack years: 35.00  ? Types: Cigarettes  ? Last attempt to quit: 01/27/2013  ? Years since quitting: 8.2  ?Smokeless Tobacco Current  ?Tobacco Comments  ? 10 cigarettes smoked daily 12/31/19 ARJ   ? ?Ready to quit: Not Answered ?Counseling given: Not Answered ?Tobacco comments: 10 cigarettes smoked daily 12/31/19 ARJ  ? ? ?Outpatient Encounter Medications as of 04/14/2021  ?Medication Sig  ? atorvastatin (LIPITOR) 20 MG tablet Take 20 mg by mouth daily.  ? citalopram (CELEXA) 20 MG tablet Take 1 tablet (20 mg total) by mouth daily.  ? diazepam (VALIUM) 10 MG tablet Take 1 tablet (10 mg total) by mouth every 6 (six) hours as needed for anxiety.  ? Fluticasone-Umeclidin-Vilant (TRELEGY ELLIPTA) 200-62.5-25 MCG/ACT AEPB Inhale 1 puff into the lungs daily.  ? vitamin B-12 (CYANOCOBALAMIN) 100 MCG tablet Take 100 mcg by mouth daily.  ? [DISCONTINUED] albuterol (VENTOLIN HFA) 108 (90 Base)  MCG/ACT inhaler Inhale 1 puff into the lungs every 6 (six) hours as needed for wheezing or shortness of breath.  ? [DISCONTINUED] Fluticasone-Umeclidin-Vilant (TRELEGY ELLIPTA) 100-62.5-25 MCG/ACT AEPB Inhale 1 puff into the lungs daily.  ? albuterol (VENTOLIN HFA) 108 (90 Base) MCG/ACT inhaler Inhale 2 puffs into the lungs every 6 (six) hours as needed for wheezing or shortness of breath.  ? [DISCONTINUED] amLODipine (NORVASC) 2.5 MG tablet Take 1 tablet (2.5 mg total) by mouth daily.  ? [DISCONTINUED] Fluticasone-Umeclidin-Vilant (TRELEGY ELLIPTA) 100-62.5-25 MCG/INH AEPB Inhale 1 puff into the lungs  daily.  ? [DISCONTINUED] HYDROcodone-acetaminophen (NORCO) 5-325 MG tablet Take 1 tablet by mouth every 4 (four) hours as needed for moderate pain.  ? [DISCONTINUED] Indacaterol Maleate 75 MCG CAPS Place 1 capsule into inhaler and inhale daily.  ? [DISCONTINUED] methocarbamol (ROBAXIN) 500 MG tablet Take 1 tablet (500 mg total) by mouth 2 (two) times daily. (Patient not taking: Reported on 04/14/2021)  ? [DISCONTINUED] metoprolol succinate (TOPROL-XL) 50 MG 24 hr tablet TAKE 1 TABLET BY MOUTH DAILY WITH OR IMMEDIATELY FOLLOWING A MEAL  ? [DISCONTINUED] naproxen sodium (ALEVE) 220 MG tablet Take 220 mg by mouth daily as needed (pain).   ? [DISCONTINUED] oxyCODONE-acetaminophen (PERCOCET) 5-325 MG tablet Take 1 tablet by mouth every 4 (four) hours as needed. (Patient not taking: Reported on 04/14/2021)  ? [DISCONTINUED] pantoprazole (PROTONIX) 40 MG tablet Take 1 tablet (40 mg total) by mouth daily. (Patient not taking: Reported on 12/31/2019)  ? [DISCONTINUED] phenazopyridine (PYRIDIUM) 200 MG tablet Take 1 tablet (200 mg total) by mouth 3 (three) times daily.  ? [DISCONTINUED] sucralfate (CARAFATE) 1 g tablet Take 1 tablet (1 g total) by mouth 4 (four) times daily -  with meals and at bedtime.  ? ?No facility-administered encounter medications on file as of 04/14/2021.  ? ? ? ?Review of Systems ? ?Review of Systems   ?N/a ?Physical Exam ? ?BP 116/68 (BP Location: Left Arm, Patient Position: Sitting, Cuff Size: Normal)   Pulse 82   Temp 98.6 ?F (37 ?C) (Oral)   Ht 5\' 2"  (1.575 m)   Wt 112 lb (50.8 kg)   SpO2 97%   BMI 20.49 kg/m

## 2021-04-14 NOTE — Patient Instructions (Signed)
Nice to see you again ? ?I increase the Trelegy to the higher dose, I refilled the albuterol ? ?I strongly recommend pulmonary rehab.  Please consider this and if you decide to move forward let me know I will send in the referral. ? ?Lets meet in 3 months to discuss how things are going ? ?Return to clinic in 3 months or sooner as needed with Dr. Silas Flood ?

## 2021-07-15 ENCOUNTER — Encounter: Payer: Self-pay | Admitting: Pulmonary Disease

## 2021-07-15 ENCOUNTER — Ambulatory Visit: Payer: Medicare HMO | Admitting: Pulmonary Disease

## 2021-07-15 VITALS — BP 118/64 | HR 77 | Temp 98.5°F | Ht 62.0 in | Wt 105.8 lb

## 2021-07-15 DIAGNOSIS — J441 Chronic obstructive pulmonary disease with (acute) exacerbation: Secondary | ICD-10-CM

## 2021-07-15 DIAGNOSIS — J4540 Moderate persistent asthma, uncomplicated: Secondary | ICD-10-CM | POA: Diagnosis not present

## 2021-07-15 DIAGNOSIS — F17210 Nicotine dependence, cigarettes, uncomplicated: Secondary | ICD-10-CM

## 2021-07-15 LAB — CBC WITH DIFFERENTIAL/PLATELET
Basophils Absolute: 0.1 10*3/uL (ref 0.0–0.1)
Basophils Relative: 0.9 % (ref 0.0–3.0)
Eosinophils Absolute: 0.1 10*3/uL (ref 0.0–0.7)
Eosinophils Relative: 1.4 % (ref 0.0–5.0)
HCT: 38.1 % (ref 36.0–46.0)
Hemoglobin: 12.4 g/dL (ref 12.0–15.0)
Lymphocytes Relative: 38.2 % (ref 12.0–46.0)
Lymphs Abs: 2.8 10*3/uL (ref 0.7–4.0)
MCHC: 32.6 g/dL (ref 30.0–36.0)
MCV: 93.2 fl (ref 78.0–100.0)
Monocytes Absolute: 0.6 10*3/uL (ref 0.1–1.0)
Monocytes Relative: 8.2 % (ref 3.0–12.0)
Neutro Abs: 3.7 10*3/uL (ref 1.4–7.7)
Neutrophils Relative %: 51.3 % (ref 43.0–77.0)
Platelets: 196 10*3/uL (ref 150.0–400.0)
RBC: 4.09 Mil/uL (ref 3.87–5.11)
RDW: 12.7 % (ref 11.5–15.5)
WBC: 7.3 10*3/uL (ref 4.0–10.5)

## 2021-07-15 MED ORDER — PREDNISONE 20 MG PO TABS: 20.0000 mg | ORAL_TABLET | Freq: Every day | ORAL | 0 refills | Status: AC

## 2021-07-15 MED ORDER — BREZTRI AEROSPHERE 160-9-4.8 MCG/ACT IN AERO: 2.0000 | INHALATION_SPRAY | Freq: Two times a day (BID) | RESPIRATORY_TRACT | 0 refills | Status: DC

## 2021-07-15 MED ORDER — BREZTRI AEROSPHERE 160-9-4.8 MCG/ACT IN AERO: 2.0000 | INHALATION_SPRAY | Freq: Two times a day (BID) | RESPIRATORY_TRACT | 6 refills | Status: DC

## 2021-07-15 NOTE — Addendum Note (Signed)
Addended by: Phillips Grout on: 07/15/2021 05:35 PM   Modules accepted: Orders

## 2021-07-15 NOTE — Progress Notes (Signed)
Patient ID: Jasmin Munoz, female    DOB: [birth year], 71 y.o.   MRN: [id number]  Chief Complaint  Patient presents with   Follow-up    Pt is here for COPD follow up. Pt states that her breathing is not doing well pt want sto discuss a different inhaler. We increased Trelegy but insurance wont pay for it. Pt states she feels like trelegy was not helping any ways. She states she is having more bad days now. Gets winded when she is sitting down.     Referring provider: [facility]  HPI:   Jasmin Munoz is a 71 y.o. woman with reported history of COPD whom are seen in follow-up with PFTs notable  for severe COPD with significant bronchodilator response indicative of likely asthma.  Seems more symptomatic over the last couple of weeks.  We will work in the garden, outside.  Cough ongoing.  Dry.  Nonproductive.  Increase in rhinorrhea however.  Feels really short of breath.  Seems worse in the mornings.  Sometimes cannot effectively use Trelegy because of her significant shortness of breath.  Improved with albuterol administration but this takes time in the morning.   HPI initial visit I cannot find any primary care PFTs to review.  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  She has had recurrent exacerbations including hospitalization in 2020 for acute hypoxic respiratory failure presumed due to COPD.  She was treated with antibiotics and steroids and improved .  On 10/06/2019 she went to the ED.  There are no provider notes.  Reportedly she got a dose of Solu-Medrol and was discharged home.  Mild improvement for short period time with resumption of current symptoms.  She has severe dyspnea on exertion with minimal exertion.  This includes walking on flat surfaces.  Worse on inclines.  She has productive cough that is worse.  She feels short of breath at rest.  She occasionally feels wheezy.  She has felt this way on and off for some  time.  Recently seen by her primary care provider 10/17/2019 giving Spiriva.  She cannot afford this and not using this.  She states she has tried made inhalers in the past and none have been effective.  She has been on Qvar recently and not effective.  Steroids seem to help for short period time but goes back to her bad breathing pattern.  Says that she has significant arthritis. This was well treated with opiates but was stopped. She says this helped her breathing as well.   PMH:  Tobacco abuse, COPD Surgical history: Hyeterctomy Family History: COPD, CAD, Cancer Social History: Current smoker, lives alone  Public house manager / Pulmonary Flowsheets:   ACT:      No data to display          MMRC:     No data to display          Epworth:      No data to display          Tests:   FENO:  No results found for: "NITRICOXIDE"  PFT:    Latest Ref Rng & Units 12/05/2019   11:55 AM  PFT Results  FVC-Pre L 1.67   FVC-Predicted Pre % 63   FVC-Post L 2.15   FVC-Predicted Post % 81   Pre FEV1/FVC % % 41   Post FEV1/FCV % % 41   FEV1-Pre L 0.68   FEV1-Predicted Pre % 33  FEV1-Post L 0.88   DLCO uncorrected ml/min/mmHg 9.37   DLCO UNC% % 53   DLCO corrected ml/min/mmHg 9.37   DLCO COR %Predicted % 53   DLVA Predicted % 56     WALK:      No data to display          Imaging: Personally reviewed and as per EMR and discussion this note No results found.  Lab Results: Personally reviewed, no significant elevation in eosinophils CBC    Component Value Date/Time   WBC 10.7 (H) 12/14/2019 0200   RBC 4.39 12/14/2019 0200   HGB 13.1 12/14/2019 0200   HGB 12.7 10/17/2019 1622   HCT 40.9 12/14/2019 0200   HCT 39.8 10/17/2019 1622   PLT 210 12/14/2019 0200   PLT 228 07/05/2016 1000   MCV 93.2 12/14/2019 0200   MCV 91 10/17/2019 1622   MCH 29.8 12/14/2019 0200   MCHC 32.0 12/14/2019 0200   RDW 12.2 12/14/2019 0200   RDW 12.0 10/17/2019 1622   LYMPHSABS 4.5 (H)  10/17/2019 1622   MONOABS 0.4 06/30/2019 0103   EOSABS 0.1 10/17/2019 1622   BASOSABS 0.1 10/17/2019 1622    BMET    Component Value Date/Time   NA 142 12/14/2019 0200   NA 145 (H) 10/17/2019 1622   K 3.8 12/14/2019 0200   CL 105 12/14/2019 0200   CO2 24 12/14/2019 0200   GLUCOSE 100 (H) 12/14/2019 0200   BUN 16 12/14/2019 0200   BUN 11 10/17/2019 1622   CREATININE 0.82 12/14/2019 0200   CREATININE 0.75 06/17/2011 1040   CALCIUM 9.1 12/14/2019 0200   GFRNONAA >60 12/14/2019 0200   GFRAA 74 10/17/2019 1622    BNP    Component Value Date/Time   BNP 13.6 10/17/2019 1622   BNP 109.5 (H) 12/03/2018 1143    ProBNP    Component Value Date/Time   PROBNP 590.1 (H) 02/03/2013 1220    Specialty Problems       Pulmonary Problems   COPD (chronic obstructive pulmonary disease) (HCC)   COPD exacerbation (HCC)    Allergies  Allergen Reactions   Sulfa Antibiotics Hives    Immunization History  Administered Date(s) Administered   PFIZER(Purple Top)SARS-COV-2 Vaccination 07/20/2019, 08/07/2019    Past Medical History:  Diagnosis Date   Anxiety    Arthritis    Cataract    COPD (chronic obstructive pulmonary disease) (HCC)    Depression    Fibromyalgia    Macular degeneration    Seizures (HCC)     Tobacco History: Social History   Tobacco Use  Smoking Status Every Day   Packs/day: 1.00   Years: 35.00   Total pack years: 35.00   Types: Cigarettes   Last attempt to quit: 01/27/2013   Years since quitting: 8.4  Smokeless Tobacco Current  Tobacco Comments   10 cigarettes smoked daily 12/31/19 ARJ    Ready to quit: Not Answered Counseling given: Not Answered Tobacco comments: 10 cigarettes smoked daily 12/31/19 ARJ    Outpatient Encounter Medications as of 07/15/2021  Medication Sig   albuterol (VENTOLIN HFA) 108 (90 Base) MCG/ACT inhaler Inhale 2 puffs into the lungs every 6 (six) hours as needed for wheezing or shortness of breath.   atorvastatin  (LIPITOR) 20 MG tablet Take 20 mg by mouth daily.   Budeson-Glycopyrrol-Formoterol (BREZTRI AEROSPHERE) 160-9-4.8 MCG/ACT AERO Inhale 2 puffs into the lungs in the morning and at bedtime.   citalopram (CELEXA) 20 MG tablet Take 1 tablet (20 mg total)  by mouth daily.   diazepam (VALIUM) 10 MG tablet Take 1 tablet (10 mg total) by mouth every 6 (six) hours as needed for anxiety.   HYDROcodone-acetaminophen (NORCO/VICODIN) 5-325 MG tablet Take 1 tablet by mouth every 6 (six) hours as needed.   predniSONE (DELTASONE) 20 MG tablet Take 1 tablet (20 mg total) by mouth daily with breakfast for 5 days.   vitamin B-12 (CYANOCOBALAMIN) 100 MCG tablet Take 100 mcg by mouth daily.   [DISCONTINUED] Fluticasone-Umeclidin-Vilant (TRELEGY ELLIPTA) 200-62.5-25 MCG/ACT AEPB Inhale 1 puff into the lungs daily. (Patient not taking: Reported on 07/15/2021)   No facility-administered encounter medications on file as of 07/15/2021.     Review of Systems  Review of Systems  N/a Physical Exam  BP 118/64 (BP Location: Left Arm, Patient Position: Sitting, Cuff Size: Normal)   Pulse 77   Temp 98.5 F (36.9 C) (Oral)   Ht 5\' 2"  (1.575 m)   Wt 105 lb 12.8 oz (48 kg)   SpO2 94%   BMI 19.35 kg/m   Wt Readings from Last 5 Encounters:  07/15/21 105 lb 12.8 oz (48 kg)  04/14/21 112 lb (50.8 kg)  08/25/20 112 lb (50.8 kg)  06/08/20 116 lb 10 oz (52.9 kg)  12/31/19 116 lb 9.6 oz (52.9 kg)    BMI Readings from Last 5 Encounters:  07/15/21 19.35 kg/m  04/14/21 20.49 kg/m  08/25/20 21.16 kg/m  06/08/20 21.33 kg/m  12/31/19 21.33 kg/m     Physical Exam General: Chronically ill appearing, in NAD Eyes EOMI, no icterus Neck: No JVD appreciated, supple Respiratory: Distant breath sounds, no wheezing Cardiovascular: Regular rhythm, no murmurs Abdomen: Nondistended, bowel sounds present Extremities: Warm, no edema MSK: No synovitis, joint effusion Neuro: Normal gait, no weakness Psych: Normal mood, flat  affect   Assessment & Plan:   Severe COPD/asthma overlap:. Significant smoking history, hyperinflation on CXR.  PFTs interpreted as severe obstruction with significant bronchodilator response in both FEV1 and FVC.  Overall, she is very symptomatic.  This has not improved with increase from Trelegy to low-dose to high-dose.  Do worry given the significance of obstruction on PFTs DPI's are not a good option for her.  Switch to Breztri 2 puff twice daily.  In addition given her worsening symptoms over the last couple of weeks, prednisone 20 mg daily x5 days sent.  This improves cough and dyspnea immediately on the right pathway.  In addition, labs today to phenotype for asthma to see if Biologics would be an option in the future if she quit smoking.  Consider cardiac evaluation if not improving with aggressive therapy as above.  Chronic cough: Dry, suspect related to chronic bronchitis although the lack of sputum production is a bit abnormal.  Denies GERD or reflux symptoms.  Assess response to change in medications as above.  Consider GERD or other reasons for cough in the future.  Tobacco abuse: Smoking assessment and cessation counseling I have advised the patient to quit/stop smoking as soon as possible due to high risk for multiple medical problems.  It will also be very difficult for 01/02/20 to manage patient's  respiratory symptoms and status if we continue to expose her lungs to a known irritant.  We do not advise e-cigarettes as a form of stopping smoking. Patient is  willing to quit smoking. I have advised the patient that we can assist and have options of nicotine replacement therapy, provided smoking cessation education today, provided smoking cessation counseling, and provided cessation resources.  Advised ongoing gradual reduction of cigarettes that she is doing.  Follow-up next office visit office visit for assessment of smoking cessation.  4 minutes spent in tobacco cessation  counseling.   Return in about 3 months (around 10/15/2021).    Karren BurlyMatthew R Camielle Sizer, MD 07/15/2021

## 2021-07-15 NOTE — Patient Instructions (Signed)
Stop Trelegy, use the new inhaler.  Breztri 2 puffs daily, every day.  Rinse her mouth out after every use.  I am hopeful changing to the puffer will be more beneficial.  Take prednisone 20 mg daily for 5 days.  If this gets rid of the cough and helps with the shortness of breath then we know we are on the right pathway in terms of blaming the symptoms on the lungs.  If things are not improving despite prednisone, we may need to think of other reasons you may be short of breath.  We will get labs today to see if you are a candidate for stronger medicine for asthma.  If the prednisone helped significantly with your symptoms we may need to consider these medications if they are an option.  Return to clinic in 3 months or sooner as needed with Dr. Judeth Horn

## 2021-07-16 LAB — IGE: IgE (Immunoglobulin E), Serum: 58 kU/L (ref <=114)

## 2021-07-17 ENCOUNTER — Encounter: Payer: Self-pay | Admitting: Pulmonary Disease

## 2021-07-17 NOTE — Telephone Encounter (Signed)
Mychart message sent by pt: Jasmin Munoz [facility] (supporting Karren Burly, MD) 1 hour ago (12:56 PM)   [facility] The new medicine you prescribed is too expensive. Upon doing some research I think Lipitor is good for COPD. I will try the prednisone for 5 days and see what happens. Maybe the Lipitor and Prednisone will help me. Since the others aren't working including the albuterol. I will let you know. Just hoping these 2 might help me.  Jasmin Munoz    I did inform pt that Lipitor is not a medication to help with her COPD but will help to lower her cholesterol. Dr. Judeth Horn, please advise on different medication that might be more affordable for pt.

## 2021-07-18 LAB — ALLERGEN PROFILE, PERENNIAL ALLERGEN IGE

## 2021-07-27 MED ORDER — PREDNISONE 10 MG PO TABS: ORAL_TABLET | ORAL | 0 refills | Status: AC

## 2021-09-03 ENCOUNTER — Telehealth: Payer: Self-pay | Admitting: Pulmonary Disease

## 2021-09-03 NOTE — Telephone Encounter (Signed)
Called and spoke to patient and told her that she would need to come in the office and walk to see if she qualifies for a POC. Got her schedule next week to come in for office visit to see if she requires oxygen. Nothing further needed

## 2021-09-09 ENCOUNTER — Encounter: Payer: Self-pay | Admitting: Pulmonary Disease

## 2021-09-09 ENCOUNTER — Ambulatory Visit: Payer: Medicare HMO | Admitting: Pulmonary Disease

## 2021-09-09 ENCOUNTER — Ambulatory Visit (INDEPENDENT_AMBULATORY_CARE_PROVIDER_SITE_OTHER): Payer: Medicare HMO

## 2021-09-09 VITALS — BP 122/68 | HR 76 | Wt 102.8 lb

## 2021-09-09 DIAGNOSIS — J441 Chronic obstructive pulmonary disease with (acute) exacerbation: Secondary | ICD-10-CM

## 2021-09-09 DIAGNOSIS — R0609 Other forms of dyspnea: Secondary | ICD-10-CM

## 2021-09-09 DIAGNOSIS — R61 Generalized hyperhidrosis: Secondary | ICD-10-CM

## 2021-09-09 MED ORDER — BREZTRI AEROSPHERE 160-9-4.8 MCG/ACT IN AERO: 2.0000 | INHALATION_SPRAY | Freq: Two times a day (BID) | RESPIRATORY_TRACT | 0 refills | Status: DC

## 2021-09-09 MED ORDER — PREDNISONE 20 MG PO TABS: ORAL_TABLET | ORAL | 0 refills | Status: DC

## 2021-09-09 MED ORDER — AMOXICILLIN-POT CLAVULANATE 875-125 MG PO TABS: 1.0000 | ORAL_TABLET | Freq: Two times a day (BID) | ORAL | 0 refills | Status: DC

## 2021-09-09 NOTE — Progress Notes (Signed)
Patient ID: Jasmin Munoz, female    DOB: 02-17-1950, 71 y.o.   MRN: [id number]  Chief Complaint  Patient presents with   Follow-up    Pt is here for follow up for COPD. Pt states that she believes she is having a flare up. Pt did not get Breztri due to the price.. Pt states the samples did help but it gave her the shakes really bad. Pt is noted to be wheezing this office visit and she took her albuterol this morning.     Referring provider: [facility]  HPI:   Jasmin Munoz is a 71 y.o. woman with reported history of COPD whom are seen in follow-up for dyspnea and sweats.  Worsening breathing over the last few weeks.  Sometimes worse at night.  To the point where she will to call EMS but held off.  She did not contact our office.  She has had sweats over the last 2 weeks.  Worsening breathing.  Worsening wheezing.  Using albuterol frequently.  Breztri helped in the past but cannot afford.  Had long discussion about goals of care.  She does not want to suffer.  She does not want to linger.  She will take in the hospital if needed but does not want to be placed on the ventilator or life support.  She is very much in favor of hospice if she were to worsen or things or not getting better.  She just wants her symptoms to be palliated.   HPI initial visit I cannot find any primary care PFTs to review.  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  She has had recurrent exacerbations including hospitalization in 2020 for acute hypoxic respiratory failure presumed due to COPD.  She was treated with antibiotics and steroids and improved .  On 10/06/2019 she went to the ED.  There are no provider notes.  Reportedly she got a dose of Solu-Medrol and was discharged home.  Mild improvement for short period time with resumption of current symptoms.  She has severe dyspnea on exertion with minimal exertion.  This includes walking on flat surfaces.   Worse on inclines.  She has productive cough that is worse.  She feels short of breath at rest.  She occasionally feels wheezy.  She has felt this way on and off for some time.  Recently seen by her primary care provider 10/17/2019 giving Spiriva.  She cannot afford this and not using this.  She states she has tried made inhalers in the past and none have been effective.  She has been on Qvar recently and not effective.  Steroids seem to help for short period time but goes back to her bad breathing pattern.  Says that she has significant arthritis. This was well treated with opiates but was stopped. She says this helped her breathing as well.   PMH:  Tobacco abuse, COPD Surgical history: Hyeterctomy Family History: COPD, CAD, Cancer Social History: Current smoker, lives alone  Public house manager / Pulmonary Flowsheets:   ACT:      No data to display           MMRC:     No data to display           Epworth:      No data to display           Tests:   FENO:  No results found for: "NITRICOXIDE"  PFT:  Latest Ref Rng & Units 12/05/2019   11:55 AM  PFT Results  FVC-Pre L 1.67   FVC-Predicted Pre % 63   FVC-Post L 2.15   FVC-Predicted Post % 81   Pre FEV1/FVC % % 41   Post FEV1/FCV % % 41   FEV1-Pre L 0.68   FEV1-Predicted Pre % 33   FEV1-Post L 0.88   DLCO uncorrected ml/min/mmHg 9.37   DLCO UNC% % 53   DLCO corrected ml/min/mmHg 9.37   DLCO COR %Predicted % 53   DLVA Predicted % 56     WALK:      No data to display           Imaging: Personally reviewed and as per EMR and discussion this note No results found.  Lab Results: Personally reviewed, no significant elevation in eosinophils CBC    Component Value Date/Time   WBC 7.3 07/15/2021 1102   RBC 4.09 07/15/2021 1102   HGB 12.4 07/15/2021 1102   HGB 12.7 10/17/2019 1622   HCT 38.1 07/15/2021 1102   HCT 39.8 10/17/2019 1622   PLT 196.0 07/15/2021 1102   PLT 228 07/05/2016 1000   MCV  93.2 07/15/2021 1102   MCV 91 10/17/2019 1622   MCH 29.8 12/14/2019 0200   MCHC 32.6 07/15/2021 1102   RDW 12.7 07/15/2021 1102   RDW 12.0 10/17/2019 1622   LYMPHSABS 2.8 07/15/2021 1102   LYMPHSABS 4.5 (H) 10/17/2019 1622   MONOABS 0.6 07/15/2021 1102   EOSABS 0.1 07/15/2021 1102   EOSABS 0.1 10/17/2019 1622   BASOSABS 0.1 07/15/2021 1102   BASOSABS 0.1 10/17/2019 1622    BMET    Component Value Date/Time   NA 142 12/14/2019 0200   NA 145 (H) 10/17/2019 1622   K 3.8 12/14/2019 0200   CL 105 12/14/2019 0200   CO2 24 12/14/2019 0200   GLUCOSE 100 (H) 12/14/2019 0200   BUN 16 12/14/2019 0200   BUN 11 10/17/2019 1622   CREATININE 0.82 12/14/2019 0200   CREATININE 0.75 06/17/2011 1040   CALCIUM 9.1 12/14/2019 0200   GFRNONAA >60 12/14/2019 0200   GFRAA 74 10/17/2019 1622    BNP    Component Value Date/Time   BNP 13.6 10/17/2019 1622   BNP 109.5 (H) 12/03/2018 1143    ProBNP    Component Value Date/Time   PROBNP 590.1 (H) 02/03/2013 1220    Specialty Problems       Pulmonary Problems   COPD (chronic obstructive pulmonary disease) (HCC)   COPD exacerbation (HCC)    Allergies  Allergen Reactions   Sulfa Antibiotics Hives    Immunization History  Administered Date(s) Administered   PFIZER(Purple Top)SARS-COV-2 Vaccination 07/20/2019, 08/07/2019    Past Medical History:  Diagnosis Date   Anxiety    Arthritis    Cataract    COPD (chronic obstructive pulmonary disease) ([place])    Depression    Fibromyalgia    Macular degeneration    Seizures (HCC)     Tobacco History: Social History   Tobacco Use  Smoking Status Every Day   Packs/day: 1.00   Years: 35.00   Total pack years: 35.00   Types: Cigarettes   Last attempt to quit: 01/27/2013   Years since quitting: 8.6  Smokeless Tobacco Current  Tobacco Comments   10 cigarettes smoked daily 09/09/2021 ALS    Ready to quit: Not Answered Counseling given: Not Answered Tobacco comments: 10  cigarettes smoked daily 09/09/2021 ALS    Outpatient Encounter Medications as  of 09/09/2021  Medication Sig   albuterol (VENTOLIN HFA) 108 (90 Base) MCG/ACT inhaler Inhale 2 puffs into the lungs every 6 (six) hours as needed for wheezing or shortness of breath.   amoxicillin-clavulanate (AUGMENTIN) 875-125 MG tablet Take 1 tablet by mouth 2 (two) times daily.   atorvastatin (LIPITOR) 20 MG tablet Take 20 mg by mouth daily.   citalopram (CELEXA) 20 MG tablet Take 1 tablet (20 mg total) by mouth daily.   diazepam (VALIUM) 10 MG tablet Take 1 tablet (10 mg total) by mouth every 6 (six) hours as needed for anxiety.   HYDROcodone-acetaminophen (NORCO/VICODIN) 5-325 MG tablet Take 1 tablet by mouth every 6 (six) hours as needed.   predniSONE (DELTASONE) 20 MG tablet Take 2 tablets (40 mg total) by mouth daily with breakfast for 5 days, THEN 1 tablet (20 mg total) daily with breakfast for 5 days, THEN 0.5 tablets (10 mg total) daily with breakfast.   [DISCONTINUED] Budeson-Glycopyrrol-Formoterol (BREZTRI AEROSPHERE) 160-9-4.8 MCG/ACT AERO Inhale 2 puffs into the lungs in the morning and at bedtime.   [DISCONTINUED] Budeson-Glycopyrrol-Formoterol (BREZTRI AEROSPHERE) 160-9-4.8 MCG/ACT AERO Inhale 2 puffs into the lungs in the morning and at bedtime.   [DISCONTINUED] vitamin B-12 (CYANOCOBALAMIN) 100 MCG tablet Take 100 mcg by mouth daily.   No facility-administered encounter medications on file as of 09/09/2021.     Review of Systems  Review of Systems  N/a Physical Exam  BP 122/68 (BP Location: Left Arm, Patient Position: Sitting, Cuff Size: Normal)   Pulse 76   Wt 102 lb 12.8 oz (46.6 kg)   SpO2 94%   BMI 18.80 kg/m   Wt Readings from Last 5 Encounters:  09/09/21 102 lb 12.8 oz (46.6 kg)  07/15/21 105 lb 12.8 oz (48 kg)  04/14/21 112 lb (50.8 kg)  08/25/20 112 lb (50.8 kg)  06/08/20 116 lb 10 oz (52.9 kg)    BMI Readings from Last 5 Encounters:  09/09/21 18.80 kg/m  07/15/21 19.35  kg/m  04/14/21 20.49 kg/m  08/25/20 21.16 kg/m  06/08/20 21.33 kg/m     Physical Exam General: Chronically ill appearing, in NAD Eyes EOMI, no icterus Neck: No JVD appreciated, supple Respiratory: Distant breath sounds, no wheezing Cardiovascular: Regular rhythm, no murmurs Abdomen: Nondistended, bowel sounds present Extremities: Warm, no edema MSK: No synovitis, joint effusion Neuro: Normal gait, no weakness Psych: Normal mood, flat affect   Assessment & Plan:   Severe COPD/asthma overlap with acute exacerbation:. Significant smoking history, hyperinflation on CXR.  PFTs interpreted as severe obstruction with significant bronchodilator response in both FEV1 and FVC.  Overall, she is very symptomatic.  This has not improved with increase from Trelegy to low-dose to high-dose.  Do worry given the significance of obstruction on PFTs DPI's are not a good option for her.  Breztri helped, continue this with patient assistance, too expensive.  In addition given her worsening symptoms over the last couple of weeks, prednisone taper sent in to remain on prednisone 10 mg daily till next visit with me in a month.  Chest x-ray today given description of sweats.  Augmentin as well.  Will obtain CT chest as below.  Night sweats, weight loss: This is suspicious for malignancy.  CT chest to start evaluation.  Goals of care: Long discussion today about her wishes.  She does not want to suffer.  She saw her mom died with emphysema on oxygen.  She will be okay with oxygen therapy but does not want life support, heroic measures  if she were to have significant worsening.  If it were declined gradually over time she would reach a point where she no longer wants to be treated medically and want to palliate her symptoms.  She does not want to suffer.  She wishes to have a DNR placed in her chart.  Consider referral to palliative care in the future although I am happy to help assist with this and hospice  placement if needed.   Return in about 4 weeks (around 10/07/2021).    Karren Burly, MD 09/09/2021   I spent 42 minutes in the care of the patient today face-to-face visit, coordination of care, review of records.

## 2021-09-09 NOTE — Patient Instructions (Signed)
I am sorry you are not feeling well  I sent a prescription for prednisone taper as well as an antibiotic to help treat your worsening breathing.  Prednisone 40 mg daily for 5 days, 20 mg daily for 5 days, next day at 10 mg daily.  I sent to stay on that until you see me next.  Will get a chest x-ray today to assess for pneumonia given the sweats  I ordered a CT scan to assess for unusual infections or other things in the chest that can cause sweats and worsening breathing, we will hope to get this done at the end of the month and give you some time to recover in the next week or 2.  Return to clinic in 4 weeks or sooner as needed with Dr. Judeth Horn

## 2021-09-09 NOTE — Addendum Note (Signed)
Addended by: Lavetta Nielsen L on: 09/09/2021 11:34 AM   Modules accepted: Orders

## 2021-09-10 MED ORDER — PREDNISONE 20 MG PO TABS: ORAL_TABLET | ORAL | 0 refills | Status: DC

## 2021-09-10 MED ORDER — AMOXICILLIN-POT CLAVULANATE 875-125 MG PO TABS: 1.0000 | ORAL_TABLET | Freq: Two times a day (BID) | ORAL | 0 refills | Status: DC

## 2021-09-10 NOTE — Progress Notes (Signed)
Chest xray is clear

## 2021-09-29 ENCOUNTER — Telehealth: Payer: Self-pay | Admitting: Pulmonary Disease

## 2021-09-29 NOTE — Telephone Encounter (Signed)
Patient had a flare up last night.  EMS treated her with IV and breathing treatment with steroid, they waited until she stabilized and before they left she was breathing ok, but she would like to speak with the nurse or doctor to see what she should do now.  Please call patient at [phone] to discuss asap.

## 2021-09-29 NOTE — Telephone Encounter (Signed)
Called and spoke to pt. Pt states she had to call EMS last night due to ShOB, diaphoresis, wheezing, chest and back pain, spo2 88% on RA. Pt states EMS gave her O2, albuterol neb and atrovent neb and IV solumedrol. Pt states her BP was 180/130 at that time last night but later improved. Pt states she is feeling much better today. Pt denies fever, ShOB, wheezing, chest discomfort. Pt states she still has a dry cough but it is manageable. Pt has CT chest scheduled for 9/1 and OV with MH on 9/15. Advised pt to keep an eye on her symptoms and if she begins to feel worse to call our office back for sooner OV. Pt verbalized understanding. Will send to Dr. Judeth Horn as Lorain Childes.

## 2021-10-02 ENCOUNTER — Ambulatory Visit
Admission: RE | Admit: 2021-10-02 | Discharge: 2021-10-02 | Disposition: A | Discharge status: Home / Self Care | Payer: Medicare HMO | Source: Ambulatory Visit | Attending: Pulmonary Disease | Admitting: Pulmonary Disease

## 2021-10-02 DIAGNOSIS — R61 Generalized hyperhidrosis: Secondary | ICD-10-CM

## 2021-10-06 ENCOUNTER — Telehealth: Payer: Self-pay | Admitting: Pulmonary Disease

## 2021-10-06 DIAGNOSIS — J9859 Other diseases of mediastinum, not elsewhere classified: Secondary | ICD-10-CM

## 2021-10-06 MED ORDER — PREDNISONE 10 MG PO TABS: 10.0000 mg | ORAL_TABLET | Freq: Every day | ORAL | 3 refills | Status: DC

## 2021-10-06 NOTE — Telephone Encounter (Signed)
Called patient back and got her follow up rescheduled to   10/21/21 MH 9:15am  PET scan and follow up were scheduled on same day. Nothing further needed

## 2021-10-06 NOTE — Telephone Encounter (Signed)
Called patient to check on her status that she was feeling ill about a week ago with interval improvement as well as to relay results of recent CT scan of her chest.  Her breathing seems okay although certainly very dyspneic.  About at baseline.  She still continues to have significant fatigue, night sweats, brain fog.  We discussed her CT scan results which shows severe emphysema and a tiny nodule, 4 mm, in the right lower lobe.  It did show an anterior mediastinal mass about 2 to 2.5 cm.  It is possible this represents lymphoma or some other malignancy is causing her night sweats and fatigue.  Nothing in the lungs are suggestive of the symptoms.  We discussed the difficulty of biopsy given location right behind sternum.  Recommend PET scan for further evaluation to see if metabolically active and to see if there is other signs of metabolic activity elsewhere in the body, particularly with high suspicion for lymphoma.  She expressed understanding.  In the interim, will prescribe 10 mg of prednisone daily to help with her ongoing severe pulmonary symptoms related to severe emphysema, severe COPD.

## 2021-10-16 ENCOUNTER — Ambulatory Visit: Payer: Medicare HMO | Admitting: Pulmonary Disease

## 2021-10-16 ENCOUNTER — Ambulatory Visit (HOSPITAL_COMMUNITY)
Admission: RE | Admit: 2021-10-16 | Discharge: 2021-10-16 | Disposition: A | Discharge status: Home / Self Care | Payer: Medicare HMO | Source: Ambulatory Visit | Attending: Pulmonary Disease | Admitting: Pulmonary Disease

## 2021-10-16 DIAGNOSIS — I700 Atherosclerosis of aorta: Secondary | ICD-10-CM | POA: Insufficient documentation

## 2021-10-16 DIAGNOSIS — R918 Other nonspecific abnormal finding of lung field: Secondary | ICD-10-CM | POA: Insufficient documentation

## 2021-10-16 DIAGNOSIS — J9859 Other diseases of mediastinum, not elsewhere classified: Secondary | ICD-10-CM | POA: Insufficient documentation

## 2021-10-16 DIAGNOSIS — N200 Calculus of kidney: Secondary | ICD-10-CM | POA: Diagnosis not present

## 2021-10-16 LAB — GLUCOSE, CAPILLARY: Glucose-Capillary: 96 mg/dL (ref 70–99)

## 2021-10-16 MED ORDER — FLUDEOXYGLUCOSE F - 18 (FDG) INJECTION
5.1000 | Freq: Once | INTRAVENOUS | Status: AC
  Administered 2021-10-16: 4.98 via INTRAVENOUS

## 2021-10-20 NOTE — Progress Notes (Signed)
The nodule in the front part of the chest does not light up on PET. This is good. Recommend repeat CT in future to monitor but no further testing or biopsy at this time.

## 2021-10-21 ENCOUNTER — Encounter: Payer: Self-pay | Admitting: Pulmonary Disease

## 2021-10-21 ENCOUNTER — Ambulatory Visit: Payer: Medicare HMO | Admitting: Pulmonary Disease

## 2021-10-21 VITALS — BP 132/88 | HR 82 | Temp 98.2°F | Ht 62.0 in | Wt 103.6 lb

## 2021-10-21 DIAGNOSIS — J9859 Other diseases of mediastinum, not elsewhere classified: Secondary | ICD-10-CM | POA: Diagnosis not present

## 2021-10-21 MED ORDER — PANTOPRAZOLE SODIUM 40 MG PO TBEC: 40.0000 mg | DELAYED_RELEASE_TABLET | Freq: Every day | ORAL | 2 refills | Status: DC

## 2021-10-21 NOTE — Patient Instructions (Addendum)
Nice to see you again  I think the results of your recent PET scan are encouraging.  We will plan to keep an eye on this at a 87-month interval in March 2023.  Then if stable once a year after that.  For the possible esophagitis seen on the scan as well as some your symptoms, take pantoprazole 1 pill 30 minutes prior to any meal.  See if this helps some of the symptoms and clears up that esophagitis.  Return to clinic in 6 months after CT scan to discuss results, or sooner as needed

## 2021-10-21 NOTE — Progress Notes (Signed)
Patient ID: Jasmin Munoz, female    DOB: 1950/05/07, 71 y.o.   MRN: [id number]  Chief Complaint  Patient presents with   Follow-up    Referring provider: [facility]  HPI:   Ms. Kalish is a 71 y.o. woman with reported history of COPD whom are seen in follow-up for dyspnea and sweats.  Overall symptoms stable.  Treated with Augmentin last visit given concern for exacerbation.  Runs on prednisone 10 mg daily.  Had a chest CT at last visit given constitutional symptoms.  This was relatively clear in terms of lungs but did demonstrate a 2 similar anterior mediastinal mass.  Subsequent PET scan was obtained last week.  This is reviewed in detail and shows no FDG avidity.  This is good news.  We discussed potential follow-up plan including surveillance versus referral to thoracic surgery.   HPI initial visit I cannot find any primary care PFTs to review.  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  She has had recurrent exacerbations including hospitalization in 2020 for acute hypoxic respiratory failure presumed due to COPD.  She was treated with antibiotics and steroids and improved .  On 10/06/2019 she went to the ED.  There are no provider notes.  Reportedly she got a dose of Solu-Medrol and was discharged home.  Mild improvement for short period time with resumption of current symptoms.  She has severe dyspnea on exertion with minimal exertion.  This includes walking on flat surfaces.  Worse on inclines.  She has productive cough that is worse.  She feels short of breath at rest.  She occasionally feels wheezy.  She has felt this way on and off for some time.  Recently seen by her primary care provider 10/17/2019 giving Spiriva.  She cannot afford this and not using this.  She states she has tried made inhalers in the past and none have been effective.  She has been on Qvar recently and not effective.  Steroids seem to help for  short period time but goes back to her bad breathing pattern.  Says that she has significant arthritis. This was well treated with opiates but was stopped. She says this helped her breathing as well.   PMH:  Tobacco abuse, COPD Surgical history: Hyeterctomy Family History: COPD, CAD, Cancer Social History: Current smoker, lives alone  Licensed conveyancer / Pulmonary Flowsheets:   ACT:      No data to display           MMRC:     No data to display           Epworth:      No data to display           Tests:   FENO:  No results found for: "NITRICOXIDE"  PFT:    Latest Ref Rng & Units 12/05/2019   11:55 AM  PFT Results  FVC-Pre L 1.67   FVC-Predicted Pre % 63   FVC-Post L 2.15   FVC-Predicted Post % 81   Pre FEV1/FVC % % 41   Post FEV1/FCV % % 41   FEV1-Pre L 0.68   FEV1-Predicted Pre % 33   FEV1-Post L 0.88   DLCO uncorrected ml/min/mmHg 9.37   DLCO UNC% % 53   DLCO corrected ml/min/mmHg 9.37   DLCO COR %Predicted % 53   DLVA Predicted % 56     WALK:      No data to display  Imaging: Personally reviewed and as per EMR and discussion this note NM PET Image Initial (PI) Skull Base To Thigh  Addendum Date: 10/18/2021   ADDENDUM REPORT: 10/18/2021 08:03 ADDENDUM: As noted in the body of the report, there is low level FDG uptake in the lower esophagus. Esophagitis would be a consideration. Electronically Signed   By: Misty Stanley M.D.   On: 10/18/2021 08:03   Result Date: 10/18/2021 CLINICAL DATA:  Initial treatment strategy for anterior mediastinal mass. EXAM: NUCLEAR MEDICINE PET SKULL BASE TO THIGH TECHNIQUE: 5.0 mCi F-18 FDG was injected intravenously. Full-ring PET imaging was performed from the skull base to thigh after the radiotracer. CT data was obtained and used for attenuation correction and anatomic localization. Fasting blood glucose: 96 mg/dl COMPARISON:  Chest CT 10/02/2021 FINDINGS: Mediastinal blood pool activity: SUV max 2.3  Liver activity: SUV max NA NECK: No hypermetabolic lymph nodes in the neck. Incidental CT findings: None. CHEST: No hypermetabolic mediastinal or hilar nodes. No suspicious pulmonary nodules on the CT scan. 2.0 x 1.5 cm homogeneous well-defined nodule in the anterior mediastinum just anterior to the ascending aorta (73/4) is photopenic with no detectable FDG accumulation. Lesion approaches water density on CT imaging. Low level uptake is identified in the distal esophagus. Incidental CT findings: Ascending thoracic aorta measures 4.1 cm diameter. Mild atherosclerotic calcification is noted in the wall of the thoracic aorta. Centrilobular emphsyema noted. Tiny right lower lobe pulmonary nodules are stable without hypermetabolism but the well below the size threshold for reliable resolution on PET imaging. ABDOMEN/PELVIS: No abnormal hypermetabolic activity within the liver, pancreas, adrenal glands, or spleen. No hypermetabolic lymph nodes in the abdomen or pelvis. Incidental CT findings: Punctate nonobstructing stone identified lower pole right kidney. There is moderate atherosclerotic calcification of the abdominal aorta without aneurysm. SKELETON: No focal hypermetabolic activity to suggest skeletal metastasis. Incidental CT findings: No worrisome lytic or sclerotic osseous abnormality. IMPRESSION: 1. 2.0 x 1.5 cm homogeneous well-defined nodule in the anterior mediastinum shows no FDG uptake on PET imaging. Given low attenuation of this nodule, it is most likely benign. Follow-up CT chest without contrast could be used to ensure stability. 2. Tiny right lower lobe pulmonary nodules appear stable in the interval. No discernible hypermetabolism, but nodules are of well below the size threshold for reliable resolution on PET imaging. 3. Punctate nonobstructing stone right kidney. 4. 4.1 cm diameter ascending thoracic aorta. Recommend annual imaging followup by CTA or MRA. This recommendation follows 2010  ACCF/AHA/AATS/ACR/ASA/SCA/SCAI/SIR/STS/SVM Guidelines for the Diagnosis and Management of Patients with Thoracic Aortic Disease. Circulation. 2010; [id number]. Aortic aneurysm NOS (ICD10-I71.9) 5.  Emphysema. [id number]:[id number]) Aortic Atherosclerois (ICD10-170.0) Electronically Signed: By: Misty Stanley M.D. On: 10/18/2021 07:29   CT Chest Wo Contrast  Result Date: 10/03/2021 CLINICAL DATA:  Night sweats, fatigue, COPD.  High-risk lung cancer. EXAM: CT CHEST WITHOUT CONTRAST TECHNIQUE: Multidetector CT imaging of the chest was performed following the standard protocol without IV contrast. RADIATION DOSE REDUCTION: This exam was performed according to the departmental dose-optimization program which includes automated exposure control, adjustment of the mA and/or kV according to patient size and/or use of iterative reconstruction technique. COMPARISON:  None Available. FINDINGS: Cardiovascular: The heart is normal in size and there is no pericardial effusion. Multi-vessel scattered coronary artery calcifications. There is atherosclerotic calcification of the aorta with aneurysmal dilatation of the ascending aorta measuring 4 cm. Pulmonary trunk is normal in caliber. Mediastinum/Nodes: No mediastinal or axillary lymphadenopathy. Evaluation of the hila is limited due to  lack of IV contrast. The thyroid gland, trachea, and esophagus are within normal limits. There is a solid circumscribed nodule in the anterior mediastinum prevascular space measuring 2.2 x 1.6 x 2.5 cm. Lungs/Pleura: Advanced emphysematous changes are present in the lungs. Apical pleural scarring is noted bilaterally and greater on the right than on the left. No consolidation, effusion, or pneumothorax. A 3 mm nodule is present in the right lower lobe, axial image 86. There is a 4 mm nodule in the right lower lobe, axial image 81. Upper Abdomen: Multiple stones are present in the left kidney. No acute abnormality. Musculoskeletal: Degenerative changes  in the thoracic spine. No acute or suspicious osseous abnormality. IMPRESSION: 1. Right lower lobe pulmonary nodules measuring up to 4 mm. No follow-up needed if patient is low-risk (and has no known or suspected primary neoplasm). Non-contrast chest CT can be considered in 12 months if patient is high-risk. This recommendation follows the consensus statement: Guidelines for Management of Incidental Pulmonary Nodules Detected on CT Images: From the Fleischner Society 2017; Radiology 2017; 284:228-243. 2. Circumscribed nodule in the anterior mediastinum, possible thymoma, thymic carcinoma, lymphoma, or less likely germ-cell tumor. 3. Emphysema. 4. Atherosclerotic calcification of the aorta with mild aneurysmal dilatation of the ascending aorta measuring 4 cm. Recommend annual imaging followup by CTA or MRA. This recommendation follows 2010 ACCF/AHA/AATS/ACR/ASA/SCA/SCAI/SIR/STS/SVM Guidelines for the Diagnosis and Management of Patients with Thoracic Aortic Disease. Circulation. 2010; [id number]. Aortic aneurysm NOS (ICD10-I71.9) 5. Left renal calculi. Electronically Signed   By: Brett Fairy M.D.   On: 10/03/2021 00:04    Lab Results: Personally reviewed, no significant elevation in eosinophils CBC    Component Value Date/Time   WBC 7.3 07/15/2021 1102   RBC 4.09 07/15/2021 1102   HGB 12.4 07/15/2021 1102   HGB 12.7 10/17/2019 1622   HCT 38.1 07/15/2021 1102   HCT 39.8 10/17/2019 1622   PLT 196.0 07/15/2021 1102   PLT 228 07/05/2016 1000   MCV 93.2 07/15/2021 1102   MCV 91 10/17/2019 1622   MCH 29.8 12/14/2019 0200   MCHC 32.6 07/15/2021 1102   RDW 12.7 07/15/2021 1102   RDW 12.0 10/17/2019 1622   LYMPHSABS 2.8 07/15/2021 1102   LYMPHSABS 4.5 (H) 10/17/2019 1622   MONOABS 0.6 07/15/2021 1102   EOSABS 0.1 07/15/2021 1102   EOSABS 0.1 10/17/2019 1622   BASOSABS 0.1 07/15/2021 1102   BASOSABS 0.1 10/17/2019 1622    BMET    Component Value Date/Time   NA 142 12/14/2019 0200   NA  145 (H) 10/17/2019 1622   K 3.8 12/14/2019 0200   CL 105 12/14/2019 0200   CO2 24 12/14/2019 0200   GLUCOSE 100 (H) 12/14/2019 0200   BUN 16 12/14/2019 0200   BUN 11 10/17/2019 1622   CREATININE 0.82 12/14/2019 0200   CREATININE 0.75 06/17/2011 1040   CALCIUM 9.1 12/14/2019 0200   GFRNONAA >60 12/14/2019 0200   GFRAA 74 10/17/2019 1622    BNP    Component Value Date/Time   BNP 13.6 10/17/2019 1622   BNP 109.5 (H) 12/03/2018 1143    ProBNP    Component Value Date/Time   PROBNP 590.1 (H) 02/03/2013 1220    Specialty Problems       Pulmonary Problems   COPD (chronic obstructive pulmonary disease) (HCC)   COPD exacerbation (HCC)    Allergies  Allergen Reactions   Sulfa Antibiotics Hives    Immunization History  Administered Date(s) Administered   PFIZER(Purple Top)SARS-COV-2 Vaccination 07/20/2019, 08/07/2019  Past Medical History:  Diagnosis Date   Anxiety    Arthritis    Cataract    COPD (chronic obstructive pulmonary disease) ([facility])    Depression    Fibromyalgia    Macular degeneration    Seizures (HCC)     Tobacco History: Social History   Tobacco Use  Smoking Status Every Day   Packs/day: 1.00   Years: 35.00   Total pack years: 35.00   Types: Cigarettes   Last attempt to quit: 01/27/2013   Years since quitting: 8.7  Smokeless Tobacco Current  Tobacco Comments   10 cigarettes smoked daily 09/09/2021 ALS    Ready to quit: Not Answered Counseling given: Not Answered Tobacco comments: 10 cigarettes smoked daily 09/09/2021 ALS    Outpatient Encounter Medications as of 10/21/2021  Medication Sig   albuterol (VENTOLIN HFA) 108 (90 Base) MCG/ACT inhaler Inhale 2 puffs into the lungs every 6 (six) hours as needed for wheezing or shortness of breath.   atorvastatin (LIPITOR) 20 MG tablet Take 20 mg by mouth daily.   Budeson-Glycopyrrol-Formoterol (BREZTRI AEROSPHERE) 160-9-4.8 MCG/ACT AERO Inhale 2 puffs into the lungs in the morning and at bedtime.    citalopram (CELEXA) 20 MG tablet Take 1 tablet (20 mg total) by mouth daily.   diazepam (VALIUM) 10 MG tablet Take 1 tablet (10 mg total) by mouth every 6 (six) hours as needed for anxiety.   HYDROcodone-acetaminophen (NORCO/VICODIN) 5-325 MG tablet Take 1 tablet by mouth every 6 (six) hours as needed.   predniSONE (DELTASONE) 10 MG tablet Take 1 tablet (10 mg total) by mouth daily with breakfast.   No facility-administered encounter medications on file as of 10/21/2021.     Review of Systems  Review of Systems  N/a Physical Exam  BP 132/88 (BP Location: Left Arm, Patient Position: Sitting, Cuff Size: Normal)   Pulse 82   Temp 98.2 F (36.8 C) (Oral)   Ht 5\' 2"  (1.575 m)   Wt 103 lb 9.6 oz (47 kg)   SpO2 97%   BMI 18.95 kg/m   Wt Readings from Last 5 Encounters:  10/21/21 103 lb 9.6 oz (47 kg)  09/09/21 102 lb 12.8 oz (46.6 kg)  07/15/21 105 lb 12.8 oz (48 kg)  04/14/21 112 lb (50.8 kg)  08/25/20 112 lb (50.8 kg)    BMI Readings from Last 5 Encounters:  10/21/21 18.95 kg/m  09/09/21 18.80 kg/m  07/15/21 19.35 kg/m  04/14/21 20.49 kg/m  08/25/20 21.16 kg/m     Physical Exam General: Chronically ill appearing, in NAD Eyes EOMI, no icterus Neck: No JVD appreciated, supple Respiratory: Distant breath sounds, no wheezing Cardiovascular: Regular rhythm, no murmurs Abdomen: Nondistended, bowel sounds present Extremities: Warm, no edema MSK: No synovitis, joint effusion Neuro: Normal gait, no weakness Psych: Normal mood, flat affect   Assessment & Plan:   Severe COPD/asthma overlap:. Significant smoking history, hyperinflation on CXR.  PFTs interpreted as severe obstruction with significant bronchodilator response in both FEV1 and FVC.  Continue Trelegy as well as prednisone 10 mg daily for now.  Has helped with symptoms.  Mediastinal mass: Seen on CT chest.  Subsequent PET scan negative for PET avidity.  After shared decision making we will do surveillance  scans, 6 months of the first then once yearly.  She is not interested in surgery or surgical referral at this time.  Possible esophagitis: With some lower abdominal pain on questioning.  PET images possible esophagitis.  Start pantoprazole 40 mg prior to meals daily  for 2 to 3 months and reassess.   Return in about 6 months (around 04/21/2022).    [name], MD 10/21/2021

## 2022-01-06 ENCOUNTER — Telehealth: Payer: Self-pay | Admitting: Pulmonary Disease

## 2022-01-06 MED ORDER — AMOXICILLIN-POT CLAVULANATE 875-125 MG PO TABS: 1.0000 | ORAL_TABLET | Freq: Two times a day (BID) | ORAL | 0 refills | Status: DC

## 2022-01-06 MED ORDER — PREDNISONE 20 MG PO TABS: 40.0000 mg | ORAL_TABLET | Freq: Every day | ORAL | 0 refills | Status: DC

## 2022-01-06 NOTE — Telephone Encounter (Signed)
Pt wants to speak to nurse about 3 day flair up she has had. Sounds out of breath.

## 2022-01-06 NOTE — Telephone Encounter (Signed)
Called and spoke with pt who states she has had a flare for the past 4 days. States that she has had complaints of increased SOB, has the shakes real bad due to the flare, has pain in her ribs from trying to breathe, and also has been wheezing.  Pt denies any complaints of cough. Pt denies any complaints of fever.  Pt states that she has been using her Breztri inhaler as prescribed, is taking prednisone as prescribed, and states that she has had to use her rescue inhaler at least 4 times daily which has not really helped with her symptoms.  Pt wants to know what might be able to be recommended to help with her symptoms. Dr. Judeth Horn, please advise.

## 2022-01-06 NOTE — Telephone Encounter (Signed)
Please send prednisone 40 mg daily for 5 days then she can return to 10 mg daily. Please send Augmentin 875-125 mg BID for 7 days. If not improving by end of week or weekend she should seek emergency care vs calling to see if we have availability in office.

## 2022-01-06 NOTE — Telephone Encounter (Signed)
Attempted to call pt but unable to reach. Left message to return call.  

## 2022-01-06 NOTE — Telephone Encounter (Signed)
I spoke with the pt and notified her of recommendations from Dr Judeth Horn. She verbalized understanding. She reported that she typically takes 3 hydrocodone per day, but today she has taken 6 already for her severe rib pain. Her pain is much improved. I advised should discuss with whoever is managing her pain and should not take any more today. If worsens or symptoms persist seek emergency care. Sending to Dr Judeth Horn as Lorain Childes.

## 2022-01-12 ENCOUNTER — Other Ambulatory Visit: Payer: Self-pay | Admitting: Pulmonary Disease

## 2022-01-16 ENCOUNTER — Other Ambulatory Visit: Payer: Self-pay | Admitting: Pulmonary Disease

## 2022-01-22 ENCOUNTER — Telehealth: Payer: Self-pay | Admitting: Pulmonary Disease

## 2022-01-22 MED ORDER — AMOXICILLIN-POT CLAVULANATE 875-125 MG PO TABS: 1.0000 | ORAL_TABLET | Freq: Two times a day (BID) | ORAL | 2 refills | Status: DC

## 2022-01-22 MED ORDER — PREDNISONE 10 MG PO TABS: ORAL_TABLET | ORAL | 2 refills | Status: AC

## 2022-01-22 NOTE — Telephone Encounter (Signed)
Called and spoke to patient and let her know that her medication was getting sent into pharmacy. Nothing further needed

## 2022-01-22 NOTE — Telephone Encounter (Signed)
Augmentin 875-125 BID for 7 days, prednisone 40 mg for 5 days, prednisone 20 mg for 5 days, then return to 10 mg daily. Thanks!

## 2022-01-22 NOTE — Telephone Encounter (Signed)
Called and spoke to patient this morning and she states that she thinks her COPD is fairing back up again.   She states it started Wednesday morning. She was having some increased SOB when up walking around and even while sitting.  She states in the past MH placed her on a course of Augmentin and Prednisone taper pack and it really does help. She is already on 10mg  of prednisone daily. She is asking if she could have Augmentin and the prednisone taper packs again if MH would approve. She is on daily Breztri as well.   Please advise sir

## 2022-01-22 NOTE — Telephone Encounter (Signed)
PT calling w/SOB, even at rest. Calls it a flare up. Seeks to talk to RN. TY [phone]

## 2022-01-22 NOTE — Telephone Encounter (Signed)
Called patietn and she states that she thinks her COPD is flaring up again. She states that she is having some increased SOB.   Started Wednesday morning.

## 2022-01-31 ENCOUNTER — Other Ambulatory Visit: Payer: Self-pay | Admitting: Pulmonary Disease

## 2022-02-05 ENCOUNTER — Emergency Department (HOSPITAL_COMMUNITY)
Admission: EM | Admit: 2022-02-05 | Discharge: 2022-02-06 | Disposition: A | Discharge status: Home / Self Care | Payer: Medicare HMO | Attending: Emergency Medicine | Admitting: Emergency Medicine

## 2022-02-05 ENCOUNTER — Telehealth: Payer: Self-pay | Admitting: Pulmonary Disease

## 2022-02-05 DIAGNOSIS — J441 Chronic obstructive pulmonary disease with (acute) exacerbation: Secondary | ICD-10-CM

## 2022-02-05 DIAGNOSIS — R0602 Shortness of breath: Secondary | ICD-10-CM | POA: Diagnosis present

## 2022-02-05 MED ORDER — PREDNISONE 20 MG PO TABS: 20.0000 mg | ORAL_TABLET | Freq: Every day | ORAL | 0 refills | Status: DC

## 2022-02-05 NOTE — Telephone Encounter (Signed)
Can increase prednisone to 20 mg daily for the next week then back to 10 mg daily.  Please keep follow-up next week with Dr. Silas Flood as planned.  If symptoms worsen will need to be seen sooner.  If unable to get in sooner will need to be seen urgent care or emergency room  Please contact office for sooner follow up if symptoms do not improve or worsen or seek emergency care

## 2022-02-05 NOTE — Telephone Encounter (Signed)
Called and spoke with patient. Advised of Tammy's recommendations. Nothing further needed at this time.

## 2022-02-05 NOTE — Telephone Encounter (Signed)
Spoke with patient she states for about a month now she has been experiencing coughing and SOB with exertion and resting. Dr. Silas Flood prescribed her Augmentin and Prednisone on 01/22/22, she states this usually clears up her symptoms but this time it hasn't seem to work. She has tried several over the counter medications and her ventolin, but nothing seems to be helping. Pt has a follow up with Dr. Silas Flood on 02/11/22. Pt is requesting possibly another round of steroid or antibiotic to help clear this up.  Tammy please advise Dr. Silas Flood is unavailable today.   Pharmacy is [facility] off [address].

## 2022-02-06 ENCOUNTER — Encounter (HOSPITAL_COMMUNITY): Payer: Self-pay

## 2022-02-06 ENCOUNTER — Emergency Department (HOSPITAL_COMMUNITY): Payer: Medicare HMO

## 2022-02-06 LAB — CBC WITH DIFFERENTIAL/PLATELET
Abs Immature Granulocytes: 0.07 10*3/uL (ref 0.00–0.07)
Basophils Absolute: 0 10*3/uL (ref 0.0–0.1)
Basophils Relative: 0 %
Eosinophils Absolute: 0 10*3/uL (ref 0.0–0.5)
Eosinophils Relative: 0 %
HCT: 42.8 % (ref 36.0–46.0)
Hemoglobin: 14.2 g/dL (ref 12.0–15.0)
Immature Granulocytes: 0 %
Lymphocytes Relative: 31 %
Lymphs Abs: 5.2 10*3/uL — ABNORMAL HIGH (ref 0.7–4.0)
MCH: 32.2 pg (ref 26.0–34.0)
MCHC: 33.2 g/dL (ref 30.0–36.0)
MCV: 97.1 fL (ref 80.0–100.0)
Monocytes Absolute: 0.7 10*3/uL (ref 0.1–1.0)
Monocytes Relative: 4 %
Neutro Abs: 10.8 10*3/uL — ABNORMAL HIGH (ref 1.7–7.7)
Neutrophils Relative %: 65 %
Platelets: 254 10*3/uL (ref 150–400)
RBC: 4.41 MIL/uL (ref 3.87–5.11)
RDW: 12.6 % (ref 11.5–15.5)
WBC: 16.7 10*3/uL — ABNORMAL HIGH (ref 4.0–10.5)
nRBC: 0 % (ref 0.0–0.2)

## 2022-02-06 LAB — BASIC METABOLIC PANEL
Anion gap: 12 (ref 5–15)
BUN: 20 mg/dL (ref 8–23)
CO2: 27 mmol/L (ref 22–32)
Calcium: 9.1 mg/dL (ref 8.9–10.3)
Chloride: 103 mmol/L (ref 98–111)
Creatinine, Ser: 0.81 mg/dL (ref 0.44–1.00)
GFR, Estimated: >60 mL/min (ref >60)
Glucose, Bld: 109 mg/dL — ABNORMAL HIGH (ref 70–99)
Potassium: 2.8 mmol/L — ABNORMAL LOW (ref 3.5–5.1)
Sodium: 142 mmol/L (ref 135–145)

## 2022-02-06 LAB — TROPONIN I (HIGH SENSITIVITY): Troponin I (High Sensitivity): 9 ng/L (ref <18)

## 2022-02-06 LAB — BRAIN NATRIURETIC PEPTIDE: B Natriuretic Peptide: 43.7 pg/mL (ref 0.0–100.0)

## 2022-02-06 MED ORDER — PREDNISONE 20 MG PO TABS
40.0000 mg | ORAL_TABLET | Freq: Every day | ORAL | Status: DC
  Administered 2022-02-06: 40 mg via ORAL
  Filled 2022-02-06: qty 2

## 2022-02-06 MED ORDER — MOMETASONE FURO-FORMOTEROL FUM 100-5 MCG/ACT IN AERO
2.0000 | INHALATION_SPRAY | Freq: Two times a day (BID) | RESPIRATORY_TRACT | Status: DC
  Filled 2022-02-06: qty 8.8

## 2022-02-06 MED ORDER — BUDESON-GLYCOPYRROL-FORMOTEROL 160-9-4.8 MCG/ACT IN AERO: 2.0000 | INHALATION_SPRAY | Freq: Two times a day (BID) | RESPIRATORY_TRACT | Status: DC

## 2022-02-06 MED ORDER — ALBUTEROL SULFATE (2.5 MG/3ML) 0.083% IN NEBU: 2.5000 mg | INHALATION_SOLUTION | RESPIRATORY_TRACT | Status: DC | PRN

## 2022-02-06 MED ORDER — HYDROCODONE-ACETAMINOPHEN 5-325 MG PO TABS: 1.0000 | ORAL_TABLET | Freq: Four times a day (QID) | ORAL | Status: DC | PRN

## 2022-02-06 MED ORDER — UMECLIDINIUM BROMIDE 62.5 MCG/ACT IN AEPB
1.0000 | INHALATION_SPRAY | Freq: Every day | RESPIRATORY_TRACT | Status: DC
  Filled 2022-02-06: qty 7

## 2022-02-06 MED ORDER — PANTOPRAZOLE SODIUM 40 MG PO TBEC
40.0000 mg | DELAYED_RELEASE_TABLET | Freq: Every day | ORAL | Status: DC
  Administered 2022-02-06: 40 mg via ORAL
  Filled 2022-02-06: qty 1

## 2022-02-06 MED ORDER — IPRATROPIUM-ALBUTEROL 0.5-2.5 (3) MG/3ML IN SOLN: 3.0000 mL | Freq: Once | RESPIRATORY_TRACT | Status: DC

## 2022-02-06 MED ORDER — POTASSIUM CHLORIDE 20 MEQ PO PACK
40.0000 meq | PACK | Freq: Once | ORAL | Status: AC
  Administered 2022-02-06: 40 meq via ORAL
  Filled 2022-02-06: qty 2

## 2022-02-06 NOTE — ED Notes (Signed)
Pt refuses to let this nurse initiate [facility] O2

## 2022-02-06 NOTE — ED Notes (Signed)
Patient is calling her grandson to see when he can come and pick her up.

## 2022-02-06 NOTE — Progress Notes (Signed)
[facility] [address] [facility])  Hospital Liaison RN note  Received request from [facility], [place] for hospice services at home after discharge. Chart and patient information under review by Hospice physician.   Spoke with patient to initiate education related to hospice philosophy, services, and team approach to care. Patient verbalizes understanding and plan is for discharge home today by POV.  DME needs discussed. Patient does not currently have any DME in the home, she requests a hospital bed and oxygen 2L nasal cannula. She is the contact for delivery and address has been verified.   Please send signed and completed DNR with patient/family. Please provide symptoms at discharge as needed for ongoing symptom management.    Thank you for the opportunity to participate in this patient's care.  Jhonnie Garner, Therapist, sports, BSN [facility] [phone]

## 2022-02-06 NOTE — ED Provider Notes (Signed)
[facility] DEPT Provider Note   CSN: [id number] Arrival date & time: 02/05/22  2354     History  Chief Complaint  Patient presents with   Shortness of [place] is a 72 y.o. female.  The history is provided by the patient, medical records and the EMS personnel.  Shortness of Breath Jasmin Munoz is a 72 y.o. female who presents to the Emergency Department complaining of difficulty breathing.  She presents to the emergency department by EMS for evaluation of difficulty breathing and poor appetite.  She had some symptoms that started around Tuesday of this week.  She called her pulmonologist and was started on 20 mg of prednisone today.  In December she was on it last with a round of amoxicillin.  She has chronic shortness of breath at rest that is now worse and it is significantly worse with exertion.  She has minimal cough.  She does have chest pain related to her esophagitis that has been present since September.  No known fever.  She has chronic ankle edema.  She did have diarrhea last night.  She lives at home alone and does not use oxygen.  She has family nearby and they are able to check on her frequently.  EMS reports respiratory distress on their arrival and she received magnesium, Solu-Medrol and nebulizer treatments.  Patient reports that she is planning to enter hospice and palliative care due to her severe COPD and does not want hospitalization.    Home Medications Prior to Admission medications   Medication Sig Start Date End Date Taking? Authorizing Provider  albuterol (VENTOLIN HFA) 108 (90 Base) MCG/ACT inhaler Inhale 2 puffs into the lungs every 6 (six) hours as needed for wheezing or shortness of breath. 04/14/21   Hunsucker, Bonna Gains, MD  amoxicillin-clavulanate (AUGMENTIN) 875-125 MG tablet Take 1 tablet by mouth 2 (two) times daily. 01/22/22   Hunsucker, Bonna Gains, MD  atorvastatin (LIPITOR) 20 MG tablet Take 20 mg by  mouth daily. 04/01/21   [provider]  BREZTRI AEROSPHERE 160-9-4.8 MCG/ACT AERO INHALE 2 PUFFS INTO THE LUNGS IN THE MORNING AND AT BEDTIME. 01/12/22   Hunsucker, Bonna Gains, MD  citalopram (CELEXA) 20 MG tablet Take 1 tablet (20 mg total) by mouth daily. 09/16/17   Mordecai Maes, NP  diazepam (VALIUM) 10 MG tablet Take 1 tablet (10 mg total) by mouth every 6 (six) hours as needed for anxiety. 05/23/13   Robyn Haber, MD  HYDROcodone-acetaminophen (NORCO/VICODIN) 5-325 MG tablet Take 1 tablet by mouth every 6 (six) hours as needed. 06/12/21   [provider]  pantoprazole (PROTONIX) 40 MG tablet TAKE 1 TABLET BY MOUTH EVERY DAY 01/18/22   Hunsucker, Bonna Gains, MD  predniSONE (DELTASONE) 10 MG tablet TAKE 1 TABLET (10 MG TOTAL) BY MOUTH DAILY WITH BREAKFAST. 02/02/22   Hunsucker, Bonna Gains, MD  predniSONE (DELTASONE) 20 MG tablet Take 1 tablet (20 mg total) by mouth daily with breakfast. 02/05/22   Parrett, Fonnie Mu, NP      Allergies    Sulfa antibiotics    Review of Systems   Review of Systems  Respiratory:  Positive for shortness of breath.   All other systems reviewed and are negative.   Physical Exam Updated Vital Signs BP (!) 144/91   Pulse 90   Temp (!) 97.4 F (36.3 C) (Oral)   Resp 19   Wt 46.3 kg   SpO2 98%   BMI 18.66 kg/m  Physical Exam Vitals and nursing note reviewed.  Constitutional:      General: She is in acute distress.     Appearance: She is well-developed.  HENT:     Head: Normocephalic and atraumatic.  Cardiovascular:     Rate and Rhythm: Normal rate and regular rhythm.     Heart sounds: No murmur heard. Pulmonary:     Effort: Respiratory distress present.     Comments: Decreased air movement bilaterally Abdominal:     Palpations: Abdomen is soft.     Tenderness: There is no abdominal tenderness. There is no guarding or rebound.  Musculoskeletal:        General: No swelling or tenderness.  Skin:    General: Skin is warm and dry.   Neurological:     Mental Status: She is alert and oriented to person, place, and time.  Psychiatric:        Behavior: Behavior normal.     ED Results / Procedures / Treatments   Labs (all labs ordered are listed, but only abnormal results are displayed) Labs Reviewed - No data to display  EKG None  Radiology No results found.  Procedures .Critical Care  Performed by: Tilden Fossa, MD Authorized by: Tilden Fossa, MD   Critical care provider statement:    Critical care time (minutes):  35   Critical care was necessary to treat or prevent imminent or life-threatening deterioration of the following conditions:  Respiratory failure   Critical care was time spent personally by me on the following activities:  Ordering and performing treatments and interventions, development of treatment plan with patient or surrogate, pulse oximetry, re-evaluation of patient's condition, examination of patient, evaluation of patient's response to treatment and obtaining history from patient or surrogate     Medications Ordered in ED Medications - No data to display  ED Course/ Medical Decision Making/ A&P                           Medical Decision Making Amount and/or Complexity of Data Reviewed Labs: ordered.  Risk Prescription drug management.   Patient with severe COPD here for evaluation of respiratory distress.  Patient does have improved breathing after EMS interventions as well as additional nebs in the emergency department.  She does have chronic severe baseline shortness of breath and dyspnea on exertion.  She declines admission or additional workup in the emergency department.  She would like to enter into hospice and palliative care at home if that is available.  She wants to avoid admission to the hospital if at all possible.  Time was spent discussing goals of care and patient wants to focus on being at home as well as controlled respiratory symptoms.  She does not want  intubation, CPR or any advanced interventions.  Will consult transitions of care regarding availability of home hospice and palliative care.  Patient care transferred pending case management evaluation.        Final Clinical Impression(s) / ED Diagnoses Final diagnoses:  None    Rx / DC Orders ED Discharge Orders     None         Tilden Fossa, MD 02/06/22 [phone]

## 2022-02-06 NOTE — ED Notes (Signed)
Pt given water per MD request 

## 2022-02-06 NOTE — ED Triage Notes (Signed)
SHOB x1 week/ hx of emphysema/COPD/ worsening SHOB tonight with no relief from home meds/ BIB EMS/ 98% post x2 duonebs and 125 solumedrol. Pt states she feels better during triage

## 2022-02-06 NOTE — ED Notes (Addendum)
Pt refuses additional testing or medications / MD aware

## 2022-02-06 NOTE — ED Notes (Signed)
Pt was able to walk from bed to door and back with no assistance, but pt was severely SHOB/ pt agreed to stay for CM  and be placed on 2L [place]. MD made aware.

## 2022-02-06 NOTE — ED Notes (Signed)
Medication administered, and patient is in a good mood and tells me that she is happy with the decision she has made because she is fed up with the COPD flare ups. She requested to have some coffee.

## 2022-02-06 NOTE — ED Notes (Signed)
Pt asked for this nurse to remove cardiac monitor/cables/ MD aware/

## 2022-02-06 NOTE — Progress Notes (Signed)
TOC CSW has contacted [facility] for palliative/hospice care.  CSW spoke with Misty [phone].  Jeshawn Melucci Tarpley-Carter, MSW, LCSW-A Pronouns:  She/Her/Hers [facility] HealthTransitions of Care Clinical Social Worker Direct Number:  [phone] [email or web address]

## 2022-02-08 NOTE — Telephone Encounter (Signed)
Called and spoke with pt letting her know recs per TP and she verbalized understanding. Nothing further needed. °

## 2022-02-11 ENCOUNTER — Encounter: Payer: Self-pay | Admitting: Pulmonary Disease

## 2022-02-11 ENCOUNTER — Telehealth: Payer: Self-pay

## 2022-02-11 ENCOUNTER — Ambulatory Visit: Payer: Medicare HMO | Admitting: Pulmonary Disease

## 2022-02-11 VITALS — BP 132/78 | HR 91 | Temp 98.0°F | Ht 62.0 in | Wt 101.2 lb

## 2022-02-11 DIAGNOSIS — J449 Chronic obstructive pulmonary disease, unspecified: Secondary | ICD-10-CM

## 2022-02-11 MED ORDER — PREDNISONE 20 MG PO TABS: 20.0000 mg | ORAL_TABLET | Freq: Every day | ORAL | 3 refills | Status: DC

## 2022-02-11 NOTE — Patient Instructions (Signed)
Nice to see you again  Lets increase the prednisone to 20 mg daily  Continue inhalers  I will send a message to get to anesthesia for care if it already has not been done  Paperwork today for [place], free to mail it back and we will start the process  Return to clinic in 3 months or sooner as needed with Dr. Silas Flood

## 2022-02-11 NOTE — Progress Notes (Addendum)
Patient ID: Jasmin Munoz, female    DOB: [birth year], 72 y.o.   MRN: [id number]  Chief Complaint  Patient presents with   Acute Visit    Wheezing, sob, asthma flare up. ED for respiratory failure. Potassium was low.     Referring provider: [facility]  HPI:   Jasmin Munoz is a 72 y.o. woman with reported history of COPD whom are seeing in hospital follow-up for dyspnea.  Doing okay.  Several prednisone courses over the last few weeks.  With what sounds like prolonged exacerbation.  She recently met with hospice, self-referral.  After ED visit.  Discussed with hospice physician earlier this week.  Does not qualify for hospice currently based on their criteria.  Discussed referral to palliative care which she is interested in.  Discussed increasing daily prednisone dose to help with symptoms.  She asks about morphine and lorazepam.  I think these may need in the future but can try increased dose of prednisone.  Also can try Biologics for likely underlying asthma.  HPI initial visit I cannot find any primary care PFTs to review.  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  She has had recurrent exacerbations including hospitalization in 2020 for acute hypoxic respiratory failure presumed due to COPD.  She was treated with antibiotics and steroids and improved .  On 10/06/2019 she went to the ED.  There are no provider notes.  Reportedly she got a dose of Solu-Medrol and was discharged home.  Mild improvement for short period time with resumption of current symptoms.  She has severe dyspnea on exertion with minimal exertion.  This includes walking on flat surfaces.  Worse on inclines.  She has productive cough that is worse.  She feels short of breath at rest.  She occasionally feels wheezy.  She has felt this way on and off for some time.  Recently seen by her primary care provider 10/17/2019 giving Spiriva.  She cannot afford this and  not using this.  She states she has tried made inhalers in the past and none have been effective.  She has been on Qvar recently and not effective.  Steroids seem to help for short period time but goes back to her bad breathing pattern.  Says that she has significant arthritis. This was well treated with opiates but was stopped. She says this helped her breathing as well.   PMH:  Tobacco abuse, COPD Surgical history: Hyeterctomy Family History: COPD, CAD, Cancer Social History: Current smoker, lives alone  Public house manager / Pulmonary Flowsheets:   ACT:      No data to display          MMRC:     No data to display          Epworth:      No data to display          Tests:   FENO:  No results found for: "NITRICOXIDE"  PFT:    Latest Ref Rng & Units 12/05/2019   11:55 AM  PFT Results  FVC-Pre L 1.67   FVC-Predicted Pre % 63   FVC-Post L 2.15   FVC-Predicted Post % 81   Pre FEV1/FVC % % 41   Post FEV1/FCV % % 41   FEV1-Pre L 0.68   FEV1-Predicted Pre % 33   FEV1-Post L 0.88   DLCO uncorrected ml/min/mmHg 9.37   DLCO UNC% % 53   DLCO corrected ml/min/mmHg 9.37  DLCO COR %Predicted % 53   DLVA Predicted % 56   Personally reviewed and interpreted as severe fixed obstruction, moderately reduced DLCO.  WALK:      No data to display          Imaging: Personally reviewed and as per EMR and discussion this note No results found.  Lab Results: Personally reviewed, no significant elevation in eosinophils CBC    Component Value Date/Time   WBC 16.7 (H) 02/06/2022 0033   RBC 4.41 02/06/2022 0033   HGB 14.2 02/06/2022 0033   HGB 12.7 10/17/2019 1622   HCT 42.8 02/06/2022 0033   HCT 39.8 10/17/2019 1622   PLT 254 02/06/2022 0033   PLT 228 07/05/2016 1000   MCV 97.1 02/06/2022 0033   MCV 91 10/17/2019 1622   MCH 32.2 02/06/2022 0033   MCHC 33.2 02/06/2022 0033   RDW 12.6 02/06/2022 0033   RDW 12.0 10/17/2019 1622   LYMPHSABS 5.2 (H) 02/06/2022  0033   LYMPHSABS 4.5 (H) 10/17/2019 1622   MONOABS 0.7 02/06/2022 0033   EOSABS 0.0 02/06/2022 0033   EOSABS 0.1 10/17/2019 1622   BASOSABS 0.0 02/06/2022 0033   BASOSABS 0.1 10/17/2019 1622    BMET    Component Value Date/Time   NA 142 02/06/2022 0033   NA 145 (H) 10/17/2019 1622   K 2.8 (L) 02/06/2022 0033   CL 103 02/06/2022 0033   CO2 27 02/06/2022 0033   GLUCOSE 109 (H) 02/06/2022 0033   BUN 20 02/06/2022 0033   BUN 11 10/17/2019 1622   CREATININE 0.81 02/06/2022 0033   CREATININE 0.75 06/17/2011 1040   CALCIUM 9.1 02/06/2022 0033   GFRNONAA >60 02/06/2022 0033   GFRAA 74 10/17/2019 1622    BNP    Component Value Date/Time   BNP 43.7 02/06/2022 0033    ProBNP    Component Value Date/Time   PROBNP 590.1 (H) 02/03/2013 1220    Specialty Problems       Pulmonary Problems   COPD (chronic obstructive pulmonary disease) (HCC)   COPD exacerbation (HCC)    Allergies  Allergen Reactions   Sulfa Antibiotics Hives    Immunization History  Administered Date(s) Administered   PFIZER(Purple Top)SARS-COV-2 Vaccination 07/20/2019, 08/07/2019    Past Medical History:  Diagnosis Date   Anxiety    Arthritis    Cataract    COPD (chronic obstructive pulmonary disease) ([place])    Depression    Fibromyalgia    Macular degeneration    Seizures (HCC)     Tobacco History: Social History   Tobacco Use  Smoking Status Every Day   Packs/day: 1.00   Years: 35.00   Total pack years: 35.00   Types: Cigarettes   Last attempt to quit: 01/27/2013   Years since quitting: 9.0  Smokeless Tobacco Current  Tobacco Comments   10 cigarettes smoked daily 09/09/2021 ALS    Ready to quit: Not Answered Counseling given: Not Answered Tobacco comments: 10 cigarettes smoked daily 09/09/2021 ALS    Outpatient Encounter Medications as of 02/11/2022  Medication Sig   albuterol (VENTOLIN HFA) 108 (90 Base) MCG/ACT inhaler Inhale 2 puffs into the lungs every 6 (six) hours as needed  for wheezing or shortness of breath.   amoxicillin-clavulanate (AUGMENTIN) 875-125 MG tablet Take 1 tablet by mouth 2 (two) times daily.   atorvastatin (LIPITOR) 20 MG tablet Take 20 mg by mouth daily.   BREZTRI AEROSPHERE 160-9-4.8 MCG/ACT AERO INHALE 2 PUFFS INTO THE LUNGS IN THE MORNING AND  AT BEDTIME.   citalopram (CELEXA) 20 MG tablet Take 1 tablet (20 mg total) by mouth daily.   diazepam (VALIUM) 10 MG tablet Take 1 tablet (10 mg total) by mouth every 6 (six) hours as needed for anxiety. (Patient taking differently: Take 10 mg by mouth 2 (two) times daily as needed for anxiety.)   HYDROcodone-acetaminophen (NORCO/VICODIN) 5-325 MG tablet Take 1 tablet by mouth every 6 (six) hours as needed for moderate pain or severe pain.   pantoprazole (PROTONIX) 40 MG tablet TAKE 1 TABLET BY MOUTH EVERY DAY   predniSONE (DELTASONE) 20 MG tablet Take 1 tablet (20 mg total) by mouth daily with breakfast.   [DISCONTINUED] predniSONE (DELTASONE) 20 MG tablet Take 1 tablet (20 mg total) by mouth daily with breakfast.   [DISCONTINUED] predniSONE (DELTASONE) 10 MG tablet TAKE 1 TABLET (10 MG TOTAL) BY MOUTH DAILY WITH BREAKFAST. (Patient not taking: Reported on 02/06/2022)   No facility-administered encounter medications on file as of 02/11/2022.     Review of Systems  Review of Systems  N/a Physical Exam  BP 132/78   Pulse 91   Temp 98 F (36.7 C) (Oral)   Ht 5\' 2"  (1.575 m)   Wt 101 lb 3.2 oz (45.9 kg)   SpO2 97%   BMI 18.51 kg/m   Wt Readings from Last 5 Encounters:  02/11/22 101 lb 3.2 oz (45.9 kg)  02/06/22 102 lb (46.3 kg)  10/21/21 103 lb 9.6 oz (47 kg)  09/09/21 102 lb 12.8 oz (46.6 kg)  07/15/21 105 lb 12.8 oz (48 kg)    BMI Readings from Last 5 Encounters:  02/11/22 18.51 kg/m  02/06/22 18.66 kg/m  10/21/21 18.95 kg/m  09/09/21 18.80 kg/m  07/15/21 19.35 kg/m     Physical Exam General: Chronically ill appearing, in NAD Eyes EOMI, no icterus Neck: No JVD appreciated,  supple Respiratory: Distant breath sounds, no wheezing Cardiovascular: Regular rhythm, no murmurs Abdomen: Nondistended, bowel sounds present Extremities: Warm, no edema MSK: No synovitis, joint effusion Neuro: Normal gait, no weakness Psych: Normal mood, flat affect   Assessment & Plan:   Severe COPD/asthma overlap:. Significant smoking history, hyperinflation on CXR.  PFTs interpreted as severe obstruction with significant bronchodilator response in both FEV1 and FVC.  Continue Breztri as well as increase prednisone 20 mg daily for now.  Has helped with symptoms.  Paperwork today for [facility] for steroid-dependent asthma.  Referral to palliative care today.  Mediastinal mass: Seen on CT chest.  Subsequent PET scan negative for PET avidity.  She is not interested in surgery or surgical referral at this time.  Does not want any other treatment.  After shared decision making, decided no further imaging or workup.   Return in about 3 months (around 05/13/2022).  I spent 40 minutes in the care of the patient including face-to-face visit, coordination care, review of records.  [name], MD 02/11/2022

## 2022-02-11 NOTE — Telephone Encounter (Signed)
(  3:53 pm) PC SW scheduled initial palliative care visit with patient. RN/SW team to see patient on 02/18/22 per her request at 8:45 am.

## 2022-02-12 ENCOUNTER — Telehealth: Payer: Self-pay | Admitting: Pulmonary Disease

## 2022-02-12 NOTE — Telephone Encounter (Signed)
Pt called in regards to yesterday's visit with MH. In OV note, it is mentioned that pt has been on morphine and lorazepam in the past.  While speaking with pt, she stated that she does not recall being on either lorazepam or morphine in the past.  Routing this info to Dr. Silas Flood for review.

## 2022-02-14 NOTE — Telephone Encounter (Signed)
This was a dictation error - I will correct - thanks

## 2022-02-15 NOTE — Telephone Encounter (Signed)
Pt states her insurance [employer] and Medicare does not cover any palliative care in [place]. She states she has called every facility and no one takes her insurance.   PCC's can you please help?

## 2022-02-15 NOTE — Telephone Encounter (Signed)
Noted. I have the paperwork for Jasmin Munoz. Will have [name] sign Wednesday. Nothing further needed

## 2022-02-15 NOTE — Telephone Encounter (Signed)
Spoke with patient she states she is not concerned about palliative care at the moment. She wants to see how Dupixent works for her first. She has filled out the paperwork and will bring it to the office one day this week. Nothing further needed.

## 2022-02-15 NOTE — Telephone Encounter (Signed)
She will need to call her insurance company then we dont know who takes what on those I would have assumed that for pallitive they would take all kinds of insurance

## 2022-02-18 ENCOUNTER — Other Ambulatory Visit: Payer: Self-pay

## 2022-02-19 ENCOUNTER — Telehealth: Payer: Self-pay | Admitting: Pharmacist

## 2022-02-19 NOTE — Telephone Encounter (Signed)
Patient is Dupixent new start. Received outdated [place] MyWay enrollment form. Completed updated form and placed in PAP pending info folder pending PA determination  Submitted a Prior Authorization request to [facility] for [place] via CoverMyMeds. Will update once we receive a response.  Key: [id number]  Knox Saliva, PharmD, MPH, BCPS, CPP Clinical Pharmacist (Rheumatology and Pulmonology)

## 2022-02-26 NOTE — Telephone Encounter (Signed)
PT is calling regarding the status of this PA. Pls call to advise  [phone]

## 2022-03-01 ENCOUNTER — Other Ambulatory Visit (HOSPITAL_COMMUNITY): Payer: Self-pay

## 2022-03-01 NOTE — Telephone Encounter (Signed)
Pt returned call and update provided along with next steps. [place] phone number given to pt and advised to call at least 2 days from now. I informed her that [facility] will be reaching out sometime this week in order to get her scheduled for a new start visit, pt is very eager to get started asap. Will f/u once PAP application has been submitted.

## 2022-03-01 NOTE — Telephone Encounter (Signed)
Received notification from [facility] regarding a prior authorization for [place]. Authorization has been APPROVED from 02/01/2022 to 02/01/2023. There is no approval letter attached to the CMM response, will await delivery via fax and send to scan center once received.  Per test claim, copay for 28 days supply is $1,139.24  Authorization # [id number]  ATC pt to provide update, LVM requesting return call. Direct office number provided. Will go ahead and ensure everything is in order for PAP submission.

## 2022-03-02 NOTE — Telephone Encounter (Signed)
[employer] patient assistance application has been submitted. Will be able to schedule new start after she is officially approved  Knox Saliva, PharmD, MPH, BCPS, CPP Clinical Pharmacist (Rheumatology and Pulmonology)

## 2022-03-08 NOTE — Telephone Encounter (Signed)
F

## 2022-03-16 ENCOUNTER — Encounter: Payer: Self-pay | Admitting: Adult Health

## 2022-03-16 ENCOUNTER — Ambulatory Visit: Payer: Medicare HMO | Admitting: Adult Health

## 2022-03-16 ENCOUNTER — Telehealth: Payer: Self-pay | Admitting: *Deleted

## 2022-03-16 VITALS — BP 140/100 | HR 100 | Temp 97.9°F | Ht 61.0 in | Wt 103.0 lb

## 2022-03-16 DIAGNOSIS — R0789 Other chest pain: Secondary | ICD-10-CM | POA: Diagnosis not present

## 2022-03-16 DIAGNOSIS — R0600 Dyspnea, unspecified: Secondary | ICD-10-CM | POA: Diagnosis not present

## 2022-03-16 DIAGNOSIS — J449 Chronic obstructive pulmonary disease, unspecified: Secondary | ICD-10-CM | POA: Diagnosis not present

## 2022-03-16 NOTE — Assessment & Plan Note (Signed)
Severe COPD and active smoker with significant symptom burden.  Patient declines palliative care.  Continue on triple therapy maintenance regimen with Breztri.  Chronic steroids with prednisone 20 mg daily.  Steroid patient education given.  Will add an albuterol nebulizer to see if this may help as well.  Advised to use either her nebulizer or inhaler or not both.  Awaiting Dupixent approval.  Plan  Patient Instructions  Refer to Cardiology for shortness of breath and chest tightness, FH of CAD.  Continue on Breztri 2 puffs Twice daily rinse after use.  May try Albuterol Neb every 6hrs as needed. Or Albuterol inhaler As needed   Continue on Prednisone 31m daily  We will be in touch about Dupixent.  Activity as tolerated  Follow up with Dr. HSilas Floodin 2 months and As needed   Please contact office for sooner follow up if symptoms do not improve or worsen or seek emergency care

## 2022-03-16 NOTE — Patient Instructions (Addendum)
Refer to Cardiology for shortness of breath and chest tightness, FH of CAD.  Continue on Breztri 2 puffs Twice daily rinse after use.  May try Albuterol Neb every 6hrs as needed. Or Albuterol inhaler As needed   Continue on Prednisone 50m daily  We will be in touch about Dupixent.  Activity as tolerated  Follow up with Dr. HSilas Floodin 2 months and As needed   Please contact office for sooner follow up if symptoms do not improve or worsen or seek emergency care

## 2022-03-16 NOTE — Telephone Encounter (Signed)
Patient was seen in the office today.  She stated that she is awaiting a call regarding her Dupixent from [facility].  Please call patient and give update.  Thank you.

## 2022-03-16 NOTE — Assessment & Plan Note (Signed)
Intermittent episodes of chest tightness.  Suspect is mostly secondary to her severe COPD with emphysema.  However she has a strong family history of heart disease.  She is also a smoker and has hypertension.  Will refer to cardiology to evaluate for possible underlying cardiac etiology  Plan  Patient Instructions  Refer to Cardiology for shortness of breath and chest tightness, FH of CAD.  Continue on Breztri 2 puffs Twice daily rinse after use.  May try Albuterol Neb every 6hrs as needed. Or Albuterol inhaler As needed   Continue on Prednisone 21m daily  We will be in touch about Dupixent.  Activity as tolerated  Follow up with Dr. HSilas Floodin 2 months and As needed   Please contact office for sooner follow up if symptoms do not improve or worsen or seek emergency care

## 2022-03-16 NOTE — Progress Notes (Signed)
$@PatientB$  ID: Jasmin Munoz, female    DOB: 21-Feb-1950, 73 y.o.   MRN: [id number]  Chief Complaint  Patient presents with   Acute Visit    Referring provider: [facility]  HPI: 72 yo female active smoker followed for COPD with Emphysema   TEST/EVENTS :  CT chest October 02, 2021 right lower lobe pulmonary nodules measuring up to 4 mm, mediastinal nodule and emphysema  PET scan October 16, 2021 shows a 2.0 well-defined nodule in the mediastinum not hypermetabolic, stable pulmonary nodules with no discernible hypermetabolism  PFTs November 04, 2019 showed severe airflow obstruction with FEV1 at 44%, ratio 41, FVC 81%, positive bronchodilator response (30% bronchodilator change), DLCO 53%  03/16/2022 Follow up ; COPD  Patient returns for a follow-up visit.  Patient has underlying severe COPD with emphysema.  She is on aggressive maintenance regimen with Breztri twice daily.  Chronic steroids with prednisone 20 mg.  Last visit patient palliative care referral was made.  Patient says that she decided to cancel this is not ready for palliative care yet.  She continues to be short of breath with minimum activity.  She uses her albuterol several times a day.  Patient does continue smoke.  We discussed smoking cessation.  She has been recommended to begin [place].  We are awaiting approval through.  Patient does complain of intermittent chest tightness that occurs during her shortness of breath episodes.  She denies any syncope or jaw pain.  Does have some intermittent palpitations.  Patient has a family history of heart disease father had a heart attack at 48 and required CABG.   Allergies  Allergen Reactions   Sulfa Antibiotics Hives    Immunization History  Administered Date(s) Administered   PFIZER(Purple Top)SARS-COV-2 Vaccination 07/20/2019, 08/07/2019    Past Medical History:  Diagnosis Date   Anxiety    Arthritis    Cataract    COPD (chronic obstructive  pulmonary disease) ([place])    Depression    Fibromyalgia    Macular degeneration    Seizures (HCC)     Tobacco History: Social History   Tobacco Use  Smoking Status Every Day   Packs/day: 1.00   Years: 35.00   Total pack years: 35.00   Types: Cigarettes   Last attempt to quit: 01/27/2013   Years since quitting: 9.1  Smokeless Tobacco Current  Tobacco Comments   Smoking less than a ppd.  Trying to quit.  03/16/2022 hfb   Ready to quit: Not Answered Counseling given: Not Answered Tobacco comments: Smoking less than a ppd.  Trying to quit.  03/16/2022 hfb   Outpatient Medications Prior to Visit  Medication Sig Dispense Refill   albuterol (VENTOLIN HFA) 108 (90 Base) MCG/ACT inhaler Inhale 2 puffs into the lungs every 6 (six) hours as needed for wheezing or shortness of breath. 3 each 11   BREZTRI AEROSPHERE 160-9-4.8 MCG/ACT AERO INHALE 2 PUFFS INTO THE LUNGS IN THE MORNING AND AT BEDTIME. 3 each 3   citalopram (CELEXA) 20 MG tablet Take 1 tablet (20 mg total) by mouth daily. 30 tablet 0   diazepam (VALIUM) 10 MG tablet Take 1 tablet (10 mg total) by mouth every 6 (six) hours as needed for anxiety. (Patient taking differently: Take 10 mg by mouth 2 (two) times daily as needed for anxiety.) 30 tablet 5   HYDROcodone-acetaminophen (NORCO/VICODIN) 5-325 MG tablet Take 1 tablet by mouth every 6 (six) hours as needed for moderate pain or severe pain.  predniSONE (DELTASONE) 20 MG tablet Take 1 tablet (20 mg total) by mouth daily with breakfast. 30 tablet 3   amoxicillin-clavulanate (AUGMENTIN) 875-125 MG tablet Take 1 tablet by mouth 2 (two) times daily. 14 tablet 2   atorvastatin (LIPITOR) 20 MG tablet Take 20 mg by mouth daily.     pantoprazole (PROTONIX) 40 MG tablet TAKE 1 TABLET BY MOUTH EVERY DAY 90 tablet 4   No facility-administered medications prior to visit.     Review of Systems:   Constitutional:   No  weight loss, night sweats,  Fevers, chills,  +fatigue, or   lassitude.  HEENT:   No headaches,  Difficulty swallowing,  Tooth/dental problems, or  Sore throat,                No sneezing, itching, ear ache, nasal congestion, post nasal drip,   CV:  No chest pain,  Orthopnea, PND, swelling in lower extremities, anasarca, dizziness, palpitations, syncope.   GI  No heartburn, indigestion, abdominal pain, nausea, vomiting, diarrhea, change in bowel habits, loss of appetite, bloody stools.   Resp:  No chest wall deformity  Skin: no rash or lesions.  GU: no dysuria, change in color of urine, no urgency or frequency.  No flank pain, no hematuria   MS:  No joint pain or swelling.  No decreased range of motion.  No back pain.    Physical Exam  BP (!) 140/100 (BP Location: Left Arm, Patient Position: Sitting, Cuff Size: Normal)   Pulse 100   Temp 97.9 F (36.6 C) (Oral)   Ht 5' 1"$  (1.549 m)   Wt 103 lb (46.7 kg)   SpO2 93%   BMI 19.46 kg/m   GEN: A/Ox3; pleasant , NAD, well nourished    HEENT:  [place]/AT,   NOSE-clear, THROAT-clear, no lesions, no postnasal drip or exudate noted.   NECK:  Supple w/ fair ROM; no JVD; normal carotid impulses w/o bruits; no thyromegaly or nodules palpated; no lymphadenopathy.    RESP  Clear  P & A; w/o, wheezes/ rales/ or rhonchi. no accessory muscle use, no dullness to percussion  CARD:  RRR, no m/r/g, no peripheral edema, pulses intact, no cyanosis or clubbing.  GI:   Soft & nt; nml bowel sounds; no organomegaly or masses detected.   Musco: Warm bil, no deformities or joint swelling noted.   Neuro: alert, no focal deficits noted.    Skin: Warm, no lesions or rashes    Lab Results:   BMET   ProBNP   Imaging: No results found.       Latest Ref Rng & Units 12/05/2019   11:55 AM  PFT Results  FVC-Pre L 1.67   FVC-Predicted Pre % 63   FVC-Post L 2.15   FVC-Predicted Post % 81   Pre FEV1/FVC % % 41   Post FEV1/FCV % % 41   FEV1-Pre L 0.68   FEV1-Predicted Pre % 33   FEV1-Post L 0.88    DLCO uncorrected ml/min/mmHg 9.37   DLCO UNC% % 53   DLCO corrected ml/min/mmHg 9.37   DLCO COR %Predicted % 53   DLVA Predicted % 56     No results found for: "NITRICOXIDE"      Assessment & Plan:   COPD (chronic obstructive pulmonary disease) Severe COPD and active smoker with significant symptom burden.  Patient declines palliative care.  Continue on triple therapy maintenance regimen with Breztri.  Chronic steroids with prednisone 20 mg daily.  Steroid patient education given.  Will add an albuterol nebulizer to see if this may help as well.  Advised to use either her nebulizer or inhaler or not both.  Awaiting Dupixent approval.  Plan  Patient Instructions  Refer to Cardiology for shortness of breath and chest tightness, FH of CAD.  Continue on Breztri 2 puffs Twice daily rinse after use.  May try Albuterol Neb every 6hrs as needed. Or Albuterol inhaler As needed   Continue on Prednisone 94m daily  We will be in touch about Dupixent.  Activity as tolerated  Follow up with Dr. HSilas Floodin 2 months and As needed   Please contact office for sooner follow up if symptoms do not improve or worsen or seek emergency care        Chest tightness Intermittent episodes of chest tightness.  Suspect is mostly secondary to her severe COPD with emphysema.  However she has a strong family history of heart disease.  She is also a smoker and has hypertension.  Will refer to cardiology to evaluate for possible underlying cardiac etiology  Plan  Patient Instructions  Refer to Cardiology for shortness of breath and chest tightness, FH of CAD.  Continue on Breztri 2 puffs Twice daily rinse after use.  May try Albuterol Neb every 6hrs as needed. Or Albuterol inhaler As needed   Continue on Prednisone 220mdaily  We will be in touch about Dupixent.  Activity as tolerated  Follow up with Dr. HuSilas Floodn 2 months and As needed   Please contact office for sooner follow up if symptoms do  not improve or worsen or seek emergency care        TaRexene EdisonNP 03/16/2022

## 2022-03-17 NOTE — Telephone Encounter (Signed)
Per update from [facility], [place] confirmed that they have everything to move forward and have completed enrollment for pt. We have not yet received an approval letter for pt at this time.  Called Corene Cornea and spoke with him, he informs me that pt was approved for PAP yesterday (2/13) and that supposedly an approval letter has already been sent. It was likely placed into a provider box and we will just need to wait for it to find it's way into ours.  Will route to [facility] so that she can reach out to the pt and schedule new start, pt has already reached out to [place] yesterday looking for an update.

## 2022-03-17 NOTE — Telephone Encounter (Signed)
Per [facility], patient is approved for Dupixent PAP through 02/01/2023. Patient is scheduled for new start appt on 03/18/2022  Knox Saliva, PharmD, MPH, BCPS, CPP Clinical Pharmacist (Rheumatology and Pulmonology)

## 2022-03-17 NOTE — Telephone Encounter (Signed)
Per [facility]M, patient is approved for Dupixent PAP through 02/01/2023. Patient is scheduled for new start appt on 03/18/2022. Will use sample  Knox Saliva, PharmD, MPH, BCPS, CPP Clinical Pharmacist (Rheumatology and Pulmonology)

## 2022-03-18 ENCOUNTER — Ambulatory Visit: Payer: Medicare HMO | Admitting: Pharmacist

## 2022-03-18 DIAGNOSIS — J449 Chronic obstructive pulmonary disease, unspecified: Secondary | ICD-10-CM

## 2022-03-18 DIAGNOSIS — J4540 Moderate persistent asthma, uncomplicated: Secondary | ICD-10-CM

## 2022-03-18 DIAGNOSIS — Z7189 Other specified counseling: Secondary | ICD-10-CM

## 2022-03-18 MED ORDER — ALBUTEROL SULFATE HFA 108 (90 BASE) MCG/ACT IN AERS: 2.0000 | INHALATION_SPRAY | Freq: Four times a day (QID) | RESPIRATORY_TRACT | 11 refills | Status: DC | PRN

## 2022-03-18 MED ORDER — DUPIXENT 300 MG/2ML ~~LOC~~ SOAJ: 300.0000 mg | SUBCUTANEOUS | 1 refills | Status: DC

## 2022-03-18 NOTE — Patient Instructions (Signed)
Your next [place] dose is due on 04/01/2022, 04/15/2022, and every 2 weeks thereafter  CONTINUE Breztri 160-9-4.65mg (2 puffs twice daily), prednisone 234mdaily  [facility] for [facility] [phone] call to schedule shipment and confirm address. They will mail your medication to your home.  You will need to be seen by your provider in 3 to 4 months to assess how DUSan Pablos working for you. Please ensure you have a follow-up appointment scheduled in May or June 2024. Call our clinic if you need to make this appointment.  How to manage an injection site reaction: Remember the 5 C's: COUNTER - leave on the counter at least 30 minutes but up to overnight to bring medication to room temperature. This may help prevent stinging COLD - place something cold (like an ice gel pack or cold water bottle) on the injection site just before cleansing with alcohol. This may help reduce pain CLARITIN - use Claritin (generic name is loratadine) for the first two weeks of treatment or the day of, the day before, and the day after injecting. This will help to minimize injection site reactions CORTISONE CREAM - apply if injection site is irritated and itching CALL ME - if injection site reaction is bigger than the size of your fist, looks infected, blisters, or if you develop hives

## 2022-03-18 NOTE — Progress Notes (Signed)
HPI Patient presents today to [facility] to see pharmacy team for [place] new start. She is accompanied by Paula Libra, her grandson. Past medical history includes asthma-COPD overlap. Has steroid-dependent asthma. History of  HTN, MDD, GAD.  She expresses some nervousness with starting injectable medication. She has never self-administered an injectable medication but is willing to try to help wean down prednisone dose  Respiratory Medications Current regimen: Breztri 160-9-4.21mg (2 puffs twice daily), prednisone 262mdaily Patient reports no known adherence challenges  OBJECTIVE Allergies  Allergen Reactions   Sulfa Antibiotics Hives    Outpatient Encounter Medications as of 03/18/2022  Medication Sig   albuterol (VENTOLIN HFA) 108 (90 Base) MCG/ACT inhaler Inhale 2 puffs into the lungs every 6 (six) hours as needed for wheezing or shortness of breath.   BREZTRI AEROSPHERE 160-9-4.8 MCG/ACT AERO INHALE 2 PUFFS INTO THE LUNGS IN THE MORNING AND AT BEDTIME.   citalopram (CELEXA) 20 MG tablet Take 1 tablet (20 mg total) by mouth daily.   diazepam (VALIUM) 10 MG tablet Take 1 tablet (10 mg total) by mouth every 6 (six) hours as needed for anxiety. (Patient taking differently: Take 10 mg by mouth 2 (two) times daily as needed for anxiety.)   HYDROcodone-acetaminophen (NORCO/VICODIN) 5-325 MG tablet Take 1 tablet by mouth every 6 (six) hours as needed for moderate pain or severe pain.   predniSONE (DELTASONE) 20 MG tablet Take 1 tablet (20 mg total) by mouth daily with breakfast.   No facility-administered encounter medications on file as of 03/18/2022.     Immunization History  Administered Date(s) Administered   PFIZER(Purple Top)SARS-COV-2 Vaccination 07/20/2019, 08/07/2019     PFTs    Latest Ref Rng & Units 12/05/2019   11:55 AM  PFT Results  FVC-Pre L 1.67   FVC-Predicted Pre % 63   FVC-Post L 2.15   FVC-Predicted Post % 81   Pre FEV1/FVC % % 41   Post FEV1/FCV % % 41    FEV1-Pre L 0.68   FEV1-Predicted Pre % 33   FEV1-Post L 0.88   DLCO uncorrected ml/min/mmHg 9.37   DLCO UNC% % 53   DLCO corrected ml/min/mmHg 9.37   DLCO COR %Predicted % 53   DLVA Predicted % 56      Eosinophils Most recent blood eosinophil count was <50 cells/microL taken on 02/06/2022 but has been 200 in past  IgE: 58 on 07/15/2021   Assessment   Biologics training for dupilumab (Dupixent)  Goals of therapy: Mechanism: human monoclonal IgG4 antibody that inhibits interleukin-4 and interleukin-13 cytokine-induced responses, including release of proinflammatory cytokines, chemokines, and IgE Reviewed that Dupixent is add-on medication and patient must continue maintenance inhaler regimen. Response to therapy: may take 4 months to determine efficacy. Discussed that patients generally feel improvement sooner than 4 months.  Side effects: injection site reaction (6-18%), antibody development (5-16%), ophthalmic conjunctivitis (2-16%), transient blood eosinophilia (1-2%)  Dose: 60027mt Week 0 (administered today in clinic) followed by 300m99mery 14 days thereafter  Administration/Storage:  Reviewed administration sites of thigh or abdomen (at least 2-3 inches away from abdomen). Reviewed the upper arm is only appropriate if caregiver is administering injection  Do not shake pen/syringe as this could lead to product foaming or precipitation. Do not use if solution is discolored or contains particulate matter or if window on prefilled pen is yellow (indicates pen has been used).  Reviewed storage of medication in refrigerator. Reviewed that Dupi[place] be stored at room temperature in unopened carton for  up to 14 days.  Access: Approval of Dupixent through: patient assistance  Patient self-administered Dupixent 340m/2ml x 2 (total dose 6042m in left lower abdomen and her gradnson administered in back of left arm using sample Dupixent 30032mmL autoinjector pen NDC:  [phone]: [id number] Expiration: 01/01/2024  Patient monitored for 30 minutes for adverse reaction.  Patient tolerated without issue. Reported minimal discomfort with injection. Injection site checked and no redness or swelling noted at injection site. Patient denies itchiness and irritation.  Medication Reconciliation  A drug regimen assessment was performed, including review of allergies, interactions, disease-state management, dosing and immunization history. Medications were reviewed with the patient, including name, instructions, indication, goals of therapy, potential side effects, importance of adherence, and safe use.  Drug interaction(s): none noted  PLAN Continue Dupixent 300m49mery 14 days.  Next dose is due 04/01/2022 and every 14 days thereafter. Rx sent to: [facility]: [phone] provided with pharmacy phone number. She has already scheduled shipment to her home - will arrive before end of month. Continue maintenance asthma regimen of: Breztri 160-9-4.8mcg50m puffs twice daily), prednisone 20mg 70my  All questions encouraged and answered.  Instructed patient to reach out with any further questions or concerns.  Thank you for allowing pharmacy to participate in this patient's care.  This appointment required 60 minutes of patient care (this includes precharting, chart review, review of results, face-to-face care, etc.).  Karnell Vanderloop Knox SalivamD, MPH, BCPS, CPP Clinical Pharmacist (Rheumatology and Pulmonology)

## 2022-03-22 ENCOUNTER — Emergency Department (HOSPITAL_COMMUNITY)
Admission: EM | Admit: 2022-03-22 | Discharge: 2022-03-22 | Disposition: A | Discharge status: Home / Self Care | Payer: Medicare HMO | Attending: Emergency Medicine | Admitting: Emergency Medicine

## 2022-03-22 ENCOUNTER — Emergency Department (HOSPITAL_COMMUNITY): Payer: Medicare HMO

## 2022-03-22 ENCOUNTER — Other Ambulatory Visit: Payer: Self-pay

## 2022-03-22 DIAGNOSIS — R0603 Acute respiratory distress: Secondary | ICD-10-CM | POA: Diagnosis present

## 2022-03-22 DIAGNOSIS — F17210 Nicotine dependence, cigarettes, uncomplicated: Secondary | ICD-10-CM | POA: Insufficient documentation

## 2022-03-22 DIAGNOSIS — Z1152 Encounter for screening for COVID-19: Secondary | ICD-10-CM | POA: Diagnosis not present

## 2022-03-22 DIAGNOSIS — J441 Chronic obstructive pulmonary disease with (acute) exacerbation: Secondary | ICD-10-CM | POA: Diagnosis not present

## 2022-03-22 LAB — BASIC METABOLIC PANEL
Anion gap: 10 (ref 5–15)
BUN: 18 mg/dL (ref 8–23)
CO2: 27 mmol/L (ref 22–32)
Calcium: 8.6 mg/dL — ABNORMAL LOW (ref 8.9–10.3)
Chloride: 104 mmol/L (ref 98–111)
Creatinine, Ser: 0.92 mg/dL (ref 0.44–1.00)
GFR, Estimated: >60 mL/min (ref >60)
Glucose, Bld: 126 mg/dL — ABNORMAL HIGH (ref 70–99)
Potassium: 3.1 mmol/L — ABNORMAL LOW (ref 3.5–5.1)
Sodium: 141 mmol/L (ref 135–145)

## 2022-03-22 LAB — CBC WITH DIFFERENTIAL/PLATELET
Abs Immature Granulocytes: 0 10*3/uL (ref 0.00–0.07)
Basophils Absolute: 0.2 10*3/uL — ABNORMAL HIGH (ref 0.0–0.1)
Basophils Relative: 1 %
Eosinophils Absolute: 0.4 10*3/uL (ref 0.0–0.5)
Eosinophils Relative: 2 %
HCT: 40 % (ref 36.0–46.0)
Hemoglobin: 12.7 g/dL (ref 12.0–15.0)
Lymphocytes Relative: 25 %
Lymphs Abs: 4.4 10*3/uL — ABNORMAL HIGH (ref 0.7–4.0)
MCH: 31.1 pg (ref 26.0–34.0)
MCHC: 31.8 g/dL (ref 30.0–36.0)
MCV: 98 fL (ref 80.0–100.0)
Monocytes Absolute: 0.7 10*3/uL (ref 0.1–1.0)
Monocytes Relative: 4 %
Neutro Abs: 12 10*3/uL — ABNORMAL HIGH (ref 1.7–7.7)
Neutrophils Relative %: 68 %
Platelets: 277 10*3/uL (ref 150–400)
RBC: 4.08 MIL/uL (ref 3.87–5.11)
RDW: 12.6 % (ref 11.5–15.5)
WBC: 17.6 10*3/uL — ABNORMAL HIGH (ref 4.0–10.5)
nRBC: 0 % (ref 0.0–0.2)

## 2022-03-22 LAB — RESP PANEL BY RT-PCR (RSV, FLU A&B, COVID)  RVPGX2
Influenza A by PCR: NEGATIVE
Influenza B by PCR: NEGATIVE
Resp Syncytial Virus by PCR: NEGATIVE
SARS Coronavirus 2 by RT PCR: NEGATIVE

## 2022-03-22 MED ORDER — ALBUTEROL SULFATE (2.5 MG/3ML) 0.083% IN NEBU
10.0000 mg | INHALATION_SOLUTION | Freq: Once | RESPIRATORY_TRACT | Status: AC
  Administered 2022-03-22: 10 mg via RESPIRATORY_TRACT
  Filled 2022-03-22: qty 12

## 2022-03-22 NOTE — Discharge Instructions (Addendum)
I strongly encourage you to make every effort to stop smoking.  Smoking can cause worsening COPD and emphysema.  Please contact your primary care doctor or clinic to arrange for follow-up visit for your ER visit today.

## 2022-03-22 NOTE — ED Triage Notes (Signed)
Pt BIB EMS from home complaining of severe shortness of breath. Upon EMS arrival, patient had absent breath sounds and was working hard. Patient received 2g of magnesium, 12m of solumedrol, 572mof nebulized albuterol, 0.54m74mf Atrovent, 0.3mg70m epinephrine in the left shoulder. 12-lead was unremarkable with PVCs. Patient has a history of COPD. With interventions with EMS, patient was wheezing. RR 22 on arrival. 18# R AC

## 2022-03-22 NOTE — ED Provider Notes (Signed)
[facility] Provider Note   CSN: [id number] Arrival date & time: 03/22/22  1854     History  Chief Complaint  Patient presents with   Respiratory Distress    Jasmin Munoz is a 72 y.o. female with history of smoking, emphysema, presented ED with shortness of breath.  Patient reports that she did feel short of breath for a week but it abruptly worsened today.  She was smoking cigarettes when this happened.  EMS arrived on scene reports patient had very diminished breath sounds bilaterally and was in respiratory distress.  I gave the patient 5 mg of albuterol, 0.5 of Atrovent, 0.3 mg epinephrine, 125 mg Solu-Medrol, 2 g of magnesium.  Patient reports significant improvement of her symptoms since arriving in the ED.  HPI     Home Medications Prior to Admission medications   Medication Sig Start Date End Date Taking? Authorizing Provider  albuterol (VENTOLIN HFA) 108 (90 Base) MCG/ACT inhaler Inhale 2 puffs into the lungs every 6 (six) hours as needed for wheezing or shortness of breath. 03/18/22   Hunsucker, Bonna Gains, MD  BREZTRI AEROSPHERE 160-9-4.8 MCG/ACT AERO INHALE 2 PUFFS INTO THE LUNGS IN THE MORNING AND AT BEDTIME. 01/12/22   Hunsucker, Bonna Gains, MD  citalopram (CELEXA) 20 MG tablet Take 1 tablet (20 mg total) by mouth daily. 09/16/17   Mordecai Maes, NP  diazepam (VALIUM) 10 MG tablet Take 1 tablet (10 mg total) by mouth every 6 (six) hours as needed for anxiety. Patient taking differently: Take 10 mg by mouth 2 (two) times daily as needed for anxiety. 05/23/13   Robyn Haber, MD  Dupilumab (DUPIXENT) 300 MG/2ML SOPN Inject 300 mg into the skin every 14 (fourteen) days. Loading dose completed in clinic on 03/18/2022 03/18/22   Hunsucker, Bonna Gains, MD  HYDROcodone-acetaminophen (NORCO/VICODIN) 5-325 MG tablet Take 1 tablet by mouth every 6 (six) hours as needed for moderate pain or severe pain. 06/12/21   [provider]   predniSONE (DELTASONE) 20 MG tablet Take 1 tablet (20 mg total) by mouth daily with breakfast. 02/11/22   Hunsucker, Bonna Gains, MD      Allergies    Sulfa antibiotics    Review of Systems   Review of Systems  Physical Exam Updated Vital Signs BP 138/86   Pulse 89   Temp 97.7 F (36.5 C)   Resp 18   SpO2 93%  Physical Exam Constitutional:      General: She is not in acute distress. HENT:     Head: Normocephalic and atraumatic.  Eyes:     Conjunctiva/sclera: Conjunctivae normal.     Pupils: Pupils are equal, round, and reactive to light.  Cardiovascular:     Rate and Rhythm: Regular rhythm. Tachycardia present.  Pulmonary:     Effort: Pulmonary effort is normal. No respiratory distress.     Comments: Satting 93% on room air, poor air movement bilaterally, no retractions, speaking full sentences Abdominal:     General: There is no distension.     Tenderness: There is no abdominal tenderness.  Skin:    General: Skin is warm and dry.  Neurological:     General: No focal deficit present.     Mental Status: She is alert. Mental status is at baseline.  Psychiatric:        Mood and Affect: Mood normal.        Behavior: Behavior normal.     ED Results / Procedures /  Treatments   Labs (all labs ordered are listed, but only abnormal results are displayed) Labs Reviewed  BASIC METABOLIC PANEL - Abnormal; Notable for the following components:      Result Value   Potassium 3.1 (*)    Glucose, Bld 126 (*)    Calcium 8.6 (*)    All other components within normal limits  CBC WITH DIFFERENTIAL/PLATELET - Abnormal; Notable for the following components:   WBC 17.6 (*)    Neutro Abs 12.0 (*)    Lymphs Abs 4.4 (*)    Basophils Absolute 0.2 (*)    All other components within normal limits  RESP PANEL BY RT-PCR (RSV, FLU A&B, COVID)  [id number]    EKG EKG Interpretation  Date/Time:  Monday March 22 2022 19:01:47 EST Ventricular Rate:  88 PR Interval:  160 QRS  Duration: 73 QT Interval:  393 QTC Calculation: 476 R Axis:   24 Text Interpretation: Sinus rhythm Probable left atrial enlargement Confirmed by Octaviano Glow [phone]) on 03/22/2022 8:04:30 PM  Radiology DG Chest Port 1 View  Result Date: 03/22/2022 CLINICAL DATA:  Shortness of breath EXAM: PORTABLE CHEST 1 VIEW COMPARISON:  X-ray 09/09/2021 FINDINGS: Hyperinflation. Apical pleural thickening. No consolidation, pneumothorax or effusion. No edema. Normal cardiopericardial silhouette. Calcified aorta. Overlapping cardiac leads. IMPRESSION: Hyperinflation.  No acute cardiopulmonary disease Electronically Signed   By: Jill Side M.D.   On: 03/22/2022 20:04    Procedures Procedures    Medications Ordered in ED Medications  albuterol (PROVENTIL) (2.5 MG/3ML) 0.083% nebulizer solution 10 mg (10 mg Nebulization Given 03/22/22 1940)    ED Course/ Medical Decision Making/ A&P Clinical Course as of 03/22/22 2243  Mon Mar 22, 2022  2038 Patient reassessed and symptomatically she is feeling much better.  She is still not moving air well but this may be a chronic emphysema.  She remained stable on room air.  Will continue to monitor her in the ED to determine disposition.  She is very much wanting to go home. [MT]  2241 Symptomatic improvement in the patient's symptoms.  She is wanting to go home.  Family, to pick her up.  Okay to go home and resume her 20 mg/day chronic steroid taper [MT]    Clinical Course User Index [MT] Loyce Flaming, Carola Rhine, MD                             Medical Decision Making Amount and/or Complexity of Data Reviewed Labs: ordered. Radiology: ordered.  Risk Prescription drug management.   This patient presents to the ED with concern for shortness of breath, wheezing. This involves an extensive number of treatment options, and is a complaint that carries with it a high risk of complications and morbidity.  The differential diagnosis includes COPD exacerbation most  likely versus pneumonia versus pleural effusion versus other  Co-morbidities that complicate the patient evaluation: History of smoking and emphysema and high risk of exacerbation  Additional history obtained from EMS  I ordered and personally interpreted labs.  The pertinent results include: Labs are baseline level.  She is a leukocytosis may be related to her chronic steroids, she takes 20 mg of prednisone daily, also received Solu-Medrol by EMS.  I ordered imaging studies including x-ray of the chest I independently visualized and interpreted imaging which showed hyperinflated lungs with no infiltrate I agree with the radiologist interpretation  The patient was maintained on a cardiac monitor.  I personally viewed and  interpreted the cardiac monitored which showed an underlying rhythm of: Sinus rhythm and sinus tachycardia  Per my interpretation the patient's ECG shows no acute ischemic findings  I ordered medication including continuous nebulizers for 1 hour.  Patient has already received IV magnesium, steroids, epi by EMS.  I have reviewed the patients home medicines and have made adjustments as needed  Test Considered: Low suspicion for acute PE.  Do not feel that CT angiogram is clinically indicated.   After the interventions noted above, I reevaluated the patient and found that they have: improved  Social Determinants of Health: counseled patient about smoking cessation  Dispostion:  After consideration of the diagnostic results and the patients response to treatment, I feel that the patent would benefit from close outpatient PCP follow-up.         Final Clinical Impression(s) / ED Diagnoses Final diagnoses:  COPD exacerbation [facility])    Rx / DC Orders ED Discharge Orders     None         Trudi Morgenthaler, Carola Rhine, MD 03/22/22 2243

## 2022-03-22 NOTE — ED Notes (Signed)
Pt ambulated to bathroom and on the way back became sob an had to stop in the hall. Pt was unable to ambulate the rest of the way to room. Assisted pt with wheelchair to room, RT at bedside and started albuterol.

## 2022-04-07 ENCOUNTER — Ambulatory Visit: Payer: Medicare HMO | Admitting: Pulmonary Disease

## 2022-04-07 ENCOUNTER — Encounter: Payer: Self-pay | Admitting: Pulmonary Disease

## 2022-04-07 VITALS — BP 116/66 | HR 79 | Temp 98.8°F | Wt 107.4 lb

## 2022-04-07 DIAGNOSIS — J449 Chronic obstructive pulmonary disease, unspecified: Secondary | ICD-10-CM | POA: Diagnosis not present

## 2022-04-07 MED ORDER — PREDNISONE 10 MG PO TABS: 10.0000 mg | ORAL_TABLET | Freq: Every day | ORAL | 0 refills | Status: DC

## 2022-04-07 NOTE — Patient Instructions (Signed)
Nice to see you again  We will start decreasing prednisone, use 10 mg once a day every day.  You can cut the 20 mg tablets in half and use the supply of 10 mg you have at home.  I will send a new prescription for 10 mg.  Assuming you are doing well, we will continue this for 3 months and decrease again at the next visit.  If your breathing worsens, just let me know or go to 20 mg, with an alternative plan to slowly decrease the prednisone.  I am glad you have quit smoking, this is great  I think the Dupixent is helping a lot as well  Return to clinic in 3 months or sooner as needed with Dr. Silas Flood

## 2022-04-07 NOTE — Progress Notes (Signed)
Patient ID: Jasmin Munoz, female    DOB: 1950/05/10, 72 y.o.   MRN: [id number]  Chief Complaint  Patient presents with   Hospitalization Follow-up    Pt is here for hospital follow up from 2/19 for Jasmin Munoz COPD falire up. Pt states that Jasmin Munoz has stopped smoking now and is doing well. Pt states that the Jasmin Munoz is working well now since Jasmin Munoz has stopped smoking.     Referring provider: [facility]  HPI:   Jasmin Munoz is a 71 y.o. woman with reported history of COPD whom are seeing in follow-up for dyspnea due to COPD/asthma overlap.  Increased Jasmin Munoz prednisone dose to 20 mg a day.  Last visit.  Seems of help.  In addition, started Dupixent.  3 doses then.  Jasmin Munoz feels back to normal.  Breathing much better.  Starting a little weight.  Jasmin Munoz quit smoking February 19.  Jasmin Munoz was congratulated on this.  Jasmin Munoz went to the ED with a mild exacerbation.  They related to do anything further in terms of changing medicines.  But after going to the hospital again Jasmin Munoz finally put away the cigarettes.  Jasmin Munoz thinks this is helped immensely.  Also Jasmin Munoz would rather go to patient's negative for difference given what I presume to be asthma overlap given Jasmin Munoz steroid dependence.  HPI initial visit I cannot find any primary care PFTs to review.  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  Jasmin Munoz has had recurrent exacerbations including hospitalization in 2020 for acute hypoxic respiratory failure presumed due to COPD.  Jasmin Munoz was treated with antibiotics and steroids and improved .  On 10/06/2019 Jasmin Munoz went to the ED.  There are no provider notes.  Reportedly Jasmin Munoz got a dose of Solu-Medrol and was discharged home.  Mild improvement for short period time with resumption of current symptoms.  Jasmin Munoz has severe dyspnea on exertion with minimal exertion.  This includes walking on flat surfaces.  Worse on inclines.  Jasmin Munoz has productive cough that is worse.  Jasmin Munoz feels short of breath at  rest.  Jasmin Munoz occasionally feels wheezy.  Jasmin Munoz has felt this way on and off for some time.  Recently seen by Jasmin Munoz primary care provider 10/17/2019 giving Spiriva.  Jasmin Munoz cannot afford this and not using this.  Jasmin Munoz states Jasmin Munoz has tried made inhalers in the past and none have been effective.  Jasmin Munoz has been on Qvar recently and not effective.  Steroids seem to help for short period time but goes back to Jasmin Munoz bad breathing pattern.  Says that Jasmin Munoz has significant arthritis. This was well treated with opiates but was stopped. Jasmin Munoz says this helped Jasmin Munoz breathing as well.   PMH:  Tobacco abuse, COPD Surgical history: Hyeterctomy Family History: COPD, CAD, Cancer Social History: Current smoker, lives alone  Licensed conveyancer / Pulmonary Flowsheets:   ACT:      No data to display           MMRC:     No data to display           Epworth:      No data to display           Tests:   FENO:  No results found for: "NITRICOXIDE"  PFT:    Latest Ref Rng & Units 12/05/2019   11:55 AM  PFT Results  FVC-Pre L 1.67   FVC-Predicted Pre % 63   FVC-Post L 2.15   FVC-Predicted Post %  81   Pre FEV1/FVC % % 41   Post FEV1/FCV % % 41   FEV1-Pre L 0.68   FEV1-Predicted Pre % 33   FEV1-Post L 0.88   DLCO uncorrected ml/min/mmHg 9.37   DLCO UNC% % 53   DLCO corrected ml/min/mmHg 9.37   DLCO COR %Predicted % 53   DLVA Predicted % 56   Personally reviewed and interpreted as severe fixed obstruction, moderately reduced DLCO.  WALK:      No data to display           Imaging: Personally reviewed and as per EMR and discussion this note DG Chest Port 1 View  Result Date: 03/22/2022 CLINICAL DATA:  Shortness of breath EXAM: PORTABLE CHEST 1 VIEW COMPARISON:  X-ray 09/09/2021 FINDINGS: Hyperinflation. Apical pleural thickening. No consolidation, pneumothorax or effusion. No edema. Normal cardiopericardial silhouette. Calcified aorta. Overlapping cardiac leads. IMPRESSION: Hyperinflation.  No acute  cardiopulmonary disease Electronically Signed   By: Jill Side M.D.   On: 03/22/2022 20:04    Lab Results: Personally reviewed, no significant elevation in eosinophils CBC    Component Value Date/Time   WBC 17.6 (H) 03/22/2022 1940   RBC 4.08 03/22/2022 1940   HGB 12.7 03/22/2022 1940   HGB 12.7 10/17/2019 1622   HCT 40.0 03/22/2022 1940   HCT 39.8 10/17/2019 1622   PLT 277 03/22/2022 1940   PLT 228 07/05/2016 1000   MCV 98.0 03/22/2022 1940   MCV 91 10/17/2019 1622   MCH 31.1 03/22/2022 1940   MCHC 31.8 03/22/2022 1940   RDW 12.6 03/22/2022 1940   RDW 12.0 10/17/2019 1622   LYMPHSABS 4.4 (H) 03/22/2022 1940   LYMPHSABS 4.5 (H) 10/17/2019 1622   MONOABS 0.7 03/22/2022 1940   EOSABS 0.4 03/22/2022 1940   EOSABS 0.1 10/17/2019 1622   BASOSABS 0.2 (H) 03/22/2022 1940   BASOSABS 0.1 10/17/2019 1622    BMET    Component Value Date/Time   NA 141 03/22/2022 1940   NA 145 (H) 10/17/2019 1622   K 3.1 (L) 03/22/2022 1940   CL 104 03/22/2022 1940   CO2 27 03/22/2022 1940   GLUCOSE 126 (H) 03/22/2022 1940   BUN 18 03/22/2022 1940   BUN 11 10/17/2019 1622   CREATININE 0.92 03/22/2022 1940   CREATININE 0.75 06/17/2011 1040   CALCIUM 8.6 (L) 03/22/2022 1940   GFRNONAA >60 03/22/2022 1940   GFRAA 74 10/17/2019 1622    BNP    Component Value Date/Time   BNP 43.7 02/06/2022 0033    ProBNP    Component Value Date/Time   PROBNP 590.1 (H) 02/03/2013 1220    Specialty Problems       Pulmonary Problems   COPD (chronic obstructive pulmonary disease) (HCC)   COPD exacerbation (HCC)    Allergies  Allergen Reactions   Sulfa Antibiotics Hives    Immunization History  Administered Date(s) Administered   PFIZER(Purple Top)SARS-COV-2 Vaccination 07/20/2019, 08/07/2019    Past Medical History:  Diagnosis Date   Anxiety    Arthritis    Cataract    COPD (chronic obstructive pulmonary disease) ([place])    Depression    Fibromyalgia    Macular degeneration     Seizures (HCC)     Tobacco History: Social History   Tobacco Use  Smoking Status Former   Packs/day: 1.00   Years: 35.00   Total pack years: 35.00   Types: Cigarettes   Quit date: 03/22/2022   Years since quitting: 0.0  Smokeless Tobacco Current  Ready to quit: Not Answered Counseling given: Not Answered   Outpatient Encounter Medications as of 04/07/2022  Medication Sig   albuterol (VENTOLIN HFA) 108 (90 Base) MCG/ACT inhaler Inhale 2 puffs into the lungs every 6 (six) hours as needed for wheezing or shortness of breath.   BREZTRI AEROSPHERE 160-9-4.8 MCG/ACT AERO INHALE 2 PUFFS INTO THE LUNGS IN THE MORNING AND AT BEDTIME.   citalopram (CELEXA) 20 MG tablet Take 1 tablet (20 mg total) by mouth daily.   diazepam (VALIUM) 10 MG tablet Take 1 tablet (10 mg total) by mouth every 6 (six) hours as needed for anxiety. (Patient taking differently: Take 10 mg by mouth 2 (two) times daily as needed for anxiety.)   Dupilumab (DUPIXENT) 300 MG/2ML SOPN Inject 300 mg into the skin every 14 (fourteen) days. Loading dose completed in clinic on 03/18/2022   HYDROcodone-acetaminophen (NORCO/VICODIN) 5-325 MG tablet Take 1 tablet by mouth every 6 (six) hours as needed for moderate pain or severe pain.   predniSONE (DELTASONE) 20 MG tablet Take 1 tablet (20 mg total) by mouth daily with breakfast.   No facility-administered encounter medications on file as of 04/07/2022.     Review of Systems  Review of Systems  N/a Physical Exam  BP 116/66 (BP Location: Left Arm, Patient Position: Sitting, Cuff Size: Normal)   Pulse 79   Temp 98.8 F (37.1 C) (Oral)   Wt 107 lb 6.4 oz (48.7 kg)   SpO2 95%   BMI 20.29 kg/m   Wt Readings from Last 5 Encounters:  04/07/22 107 lb 6.4 oz (48.7 kg)  03/16/22 103 lb (46.7 kg)  02/11/22 101 lb 3.2 oz (45.9 kg)  02/06/22 102 lb (46.3 kg)  10/21/21 103 lb 9.6 oz (47 kg)    BMI Readings from Last 5 Encounters:  04/07/22 20.29 kg/m  03/16/22 19.46 kg/m   02/11/22 18.51 kg/m  02/06/22 18.66 kg/m  10/21/21 18.95 kg/m     Physical Exam General: Chronically ill appearing, in NAD Eyes EOMI, no icterus Neck: No JVD appreciated, supple Respiratory: Distant breath sounds, no wheezing Cardiovascular: Regular rhythm, no murmurs Abdomen: Nondistended, bowel sounds present Extremities: Warm, no edema MSK: No synovitis, joint effusion Neuro: Normal gait, no weakness Psych: Normal mood, flat affect   Assessment & Plan:   Severe COPD/asthma overlap:. Significant smoking history, hyperinflation on CXR.  PFTs interpreted as severe obstruction with significant bronchodilator response in both FEV1 and FVC.  Continue Breztri .  Dupixent started 03/2022.  Marked improvement in symptoms.  As well as smoking cessation has helped.  Continue Dupixent.  Wean prednisone to 10 mg daily.  Mediastinal mass: Seen on CT chest.  Subsequent PET scan negative for PET avidity.  Jasmin Munoz is not interested in surgery or surgical referral at this time.  Does not want any other treatment.  After shared decision making, decided no further imaging or workup.   Return in about 3 months (around 07/08/2022).    [name], MD 04/07/2022

## 2022-04-08 ENCOUNTER — Ambulatory Visit: Payer: Medicare HMO | Admitting: Behavioral Health

## 2022-04-08 ENCOUNTER — Encounter: Payer: Self-pay | Admitting: Behavioral Health

## 2022-04-08 VITALS — BP 152/104 | HR 100 | Ht 61.0 in | Wt 106.0 lb

## 2022-04-08 DIAGNOSIS — F330 Major depressive disorder, recurrent, mild: Secondary | ICD-10-CM

## 2022-04-08 DIAGNOSIS — F411 Generalized anxiety disorder: Secondary | ICD-10-CM | POA: Diagnosis not present

## 2022-04-08 NOTE — Progress Notes (Signed)
[facility] MD/PA/NP Initial Note  04/08/2022 7:27 PM Jasmin Munoz  MRN:  [id number]  Chief Complaint:  Chief Complaint   Depression; Anxiety; Establish Care; Medication Refill; Patient Education     HPI:   "Jasmin Munoz", 72 year old patient presents to this office for initial visit and to establish care.  Collateral information should be considered reliable.  Patient says that she is here today to find a new provider to manage her psychiatric medications.  She is a former patient of Dr. Launa Flight retired.  She reports a long period of stability and is very happy with her current medication regimen.  She needs no new refills today and will notify this office when needed.  She is requesting follow-up in 6 months.  She reports her anxiety today at 1/10, and depression at 1/10.  She is aware of the effects of long term benzodiazepine use. She is sleeping 7 to 8 hours per night.  Reports no history of mania, no psychosis, no auditory or visual hallucinations or delirium.  No SI or HI.  Patient is retired and lives at home alone.  Past psychiatric medication trials.  Patient will provide complete list next visit. Celexa Valium    Visit Diagnosis:    ICD-10-CM   1. Mild episode of recurrent major depressive disorder ([place])  F33.0     2. Generalized anxiety disorder  F41.1       Past Psychiatric History: Anxiety, MDD  Past Medical History:  Past Medical History:  Diagnosis Date   Anxiety    Arthritis    Cataract    COPD (chronic obstructive pulmonary disease) (HCC)    Depression    Fibromyalgia    Macular degeneration    Seizures ([place])     Past Surgical History:  Procedure Laterality Date   ABDOMINAL HYSTERECTOMY      Family Psychiatric History: none note this visit.  Family History:  Family History  Problem Relation Age of Onset   Emphysema Mother    Arthritis Father    Anxiety disorder Sister    Drug abuse Sister    Depression Sister    Alcohol abuse Sister     Bipolar disorder Sister    Arthritis Sister    Joint hypermobility Brother     Social History:  Social History   Socioeconomic History   Marital status: Widowed    Spouse name: Not on file   Number of children: 1   Years of education: 16   Highest education level: Bachelor's degree (e.g., BA, AB, BS)  Occupational History   Occupation: Retired  Tobacco Use   Smoking status: Former    Packs/day: 1.00    Years: 35.00    Total pack years: 35.00    Types: Cigarettes    Quit date: 03/22/2022    Years since quitting: 0.0   Smokeless tobacco: Current  Substance and Sexual Activity   Alcohol use: No   Drug use: No   Sexual activity: Not Currently  Other Topics Concern   Not on file  Social History Narrative   Lives alone in [place]. Sings Karaoke in free time.    Social Determinants of Health   Financial Resource Strain: Not on file  Food Insecurity: Not on file  Transportation Needs: Not on file  Physical Activity: Not on file  Stress: Not on file  Social Connections: Not on file    Allergies:  Allergies  Allergen Reactions   Sulfa Antibiotics Hives    Metabolic Disorder Labs: Lab Results  Component Value Date   HGBA1C 5.3 10/17/2019   MPG 96.8 09/14/2017   No results found for: "PROLACTIN" Lab Results  Component Value Date   CHOL 174 01/15/2019   TRIG 128 01/15/2019   HDL 77 01/15/2019   CHOLHDL 2.3 01/15/2019   VLDL 25 09/14/2017   LDLCALC 75 01/15/2019   LDLCALC 123 (H) 09/14/2017   Lab Results  Component Value Date   TSH 1.880 10/17/2019   TSH 2.711 09/14/2017    Therapeutic Level Labs: No results found for: "LITHIUM" No results found for: "VALPROATE" No results found for: "CBMZ"  Current Medications: Current Outpatient Medications  Medication Sig Dispense Refill   albuterol (VENTOLIN HFA) 108 (90 Base) MCG/ACT inhaler Inhale 2 puffs into the lungs every 6 (six) hours as needed for wheezing or shortness of breath. 3 each 11   BREZTRI  AEROSPHERE 160-9-4.8 MCG/ACT AERO INHALE 2 PUFFS INTO THE LUNGS IN THE MORNING AND AT BEDTIME. 3 each 3   citalopram (CELEXA) 20 MG tablet Take 1 tablet (20 mg total) by mouth daily. 30 tablet 0   diazepam (VALIUM) 10 MG tablet Take 1 tablet (10 mg total) by mouth every 6 (six) hours as needed for anxiety. (Patient taking differently: Take 10 mg by mouth 2 (two) times daily as needed for anxiety.) 30 tablet 5   Dupilumab (DUPIXENT) 300 MG/2ML SOPN Inject 300 mg into the skin every 14 (fourteen) days. Loading dose completed in clinic on 03/18/2022 12 mL 1   HYDROcodone-acetaminophen (NORCO/VICODIN) 5-325 MG tablet Take 1 tablet by mouth every 6 (six) hours as needed for moderate pain or severe pain.     naloxone (NARCAN) nasal spray 4 mg/0.1 mL SMARTSIG:Both Nares     predniSONE (DELTASONE) 10 MG tablet Take 1 tablet (10 mg total) by mouth daily with breakfast. 90 tablet 0   No current facility-administered medications for this visit.    Medication Side Effects: none  Orders placed this visit:  No orders of the defined types were placed in this encounter.   Psychiatric Specialty Exam:  Review of Systems  Constitutional: Negative.   Allergic/Immunologic: Negative.   Neurological: Negative.   Psychiatric/Behavioral: Negative.      There were no vitals taken for this visit.There is no height or weight on file to calculate BMI.  General Appearance: Casual, Neat, and Well Groomed  Eye Contact:  Good  Speech:  Talkative  Volume:  Normal  Mood:  NA  Affect:  Appropriate  Thought Process:  Coherent  Orientation:  Full (Time, Place, and Person)  Thought Content: Logical   Suicidal Thoughts:  No  Homicidal Thoughts:  No  Memory:  WNL  Judgement:  Good  Insight:  Good  Psychomotor Activity:  Normal  Concentration:  Concentration: Good  Recall:  Good  Fund of Knowledge: Good  Language: Good  Assets:  Desire for Improvement  ADL's:  Intact  Cognition: WNL  Prognosis:  Good    Screenings:  GAD-7    Flowsheet Row Office Visit from 10/31/2019 in [facility] at [facility]  Total GAD-7 Score 0      PHQ2-9    [place] Office Visit from 10/31/2019 in Primary Care at [place] from 10/17/2019 in Primary Care at [place] from 01/15/2019 in Primary Care at [place] from 09/19/2017 in [place] at [place] from 08/17/2017 in [place] at [facility] Total Score 0 0 0 [place] ED  from 03/22/2022 in [facility] Emergency Department at [facility] from 02/05/2022 in [facility] Emergency Department at [facility] ED from 08/25/2020 in [facility] Emergency Department at [place] No Risk No Risk No Risk       Receiving Psychotherapy: No   Treatment Plan/Recommendations:   Greater than 50% of face to face time with patient was spent on counseling and coordination of care. We discussed her current medications and previous plan of care under retired Dr. Launa Flight.  She has been stable for a very long period and just needing a new provider for medication management.  I reviewed past and current medication. We agreed to: Patient was counseled and advised of her current high BP and it was recommended that she follow-up with her PCP promptly. Patient is a current smoker but states that she has not had any cigarettes in approximately 3 weeks.  Says that her goal is to completely stop smoking over the next 6 months. Will continue Valium 10 mg 2 times daily as needed for severe anxiety To continue Celexa 20 mg daily Will report worsening symptoms or side effects promptly To follow-up in 6 months to reassess per patient. Provided emergency contact information Discussed potential benefits, risk, and side effects of benzodiazepines to include potential risk of tolerance and dependence, as well as possible drowsiness.  Advised patient not to drive if experiencing  drowsiness and to take lowest possible effective dose to minimize risk of dependence and tolerance.  Reviewed PDMP       Elwanda Brooklyn, NP

## 2022-04-16 ENCOUNTER — Other Ambulatory Visit: Payer: Medicare HMO

## 2022-06-18 ENCOUNTER — Telehealth: Payer: Self-pay | Admitting: Pulmonary Disease

## 2022-06-18 MED ORDER — PREDNISONE 20 MG PO TABS: ORAL_TABLET | ORAL | 0 refills | Status: DC

## 2022-06-18 MED ORDER — DOXYCYCLINE HYCLATE 100 MG PO TABS: 100.0000 mg | ORAL_TABLET | Freq: Two times a day (BID) | ORAL | 0 refills | Status: DC

## 2022-06-18 NOTE — Telephone Encounter (Signed)
Doxycycline 100 mg twice a day for 7 days and prednisone 40 mg for 5 days then 20 mg for 5 days sent to pharmacy of choice.  After completing the 20 mg of prednisone she can resume her daily 10 mg of prednisone.  If not improving early next week I advise she come be seen in person or seek emergency care.

## 2022-06-18 NOTE — Telephone Encounter (Addendum)
Spoke to pt and she states she is having sob and bruising on arms and legs. She thinks this is due to her COPD she has taken albuterol (VENTOLIN HFA) 108 (90 Base) MCG/ACT inhaler, BREZTRI AEROSPHERE 160-9-4.8 MCG/ACT AERO and just received her Dupilumab (DUPIXENT) 300 MG/2ML SOPN, no otc meds. She has no way of coming to the office. I informed her I will send this message to Dr Judeth Horn. Pt verbalized understanding. UPDATE: called pt back and informed her of Doxycycline 100 mg and prednisone 20 mg being sent to [facility]. Nothing further needed.

## 2022-06-21 ENCOUNTER — Other Ambulatory Visit: Payer: Self-pay | Admitting: Behavioral Health

## 2022-06-21 ENCOUNTER — Telehealth: Payer: Self-pay | Admitting: Behavioral Health

## 2022-06-21 DIAGNOSIS — F411 Generalized anxiety disorder: Secondary | ICD-10-CM

## 2022-06-21 DIAGNOSIS — F330 Major depressive disorder, recurrent, mild: Secondary | ICD-10-CM

## 2022-06-21 MED ORDER — DIAZEPAM 10 MG PO TABS: 10.0000 mg | ORAL_TABLET | Freq: Two times a day (BID) | ORAL | 2 refills | Status: AC | PRN

## 2022-06-21 MED ORDER — CITALOPRAM HYDROBROMIDE 20 MG PO TABS: 20.0000 mg | ORAL_TABLET | Freq: Every day | ORAL | 2 refills | Status: DC

## 2022-06-21 NOTE — Telephone Encounter (Signed)
Pt left message requesting RF for Citalopram 20 mg 1/d and Valium 10 mg 2/d PRN. [facility] [address]

## 2022-06-21 NOTE — Telephone Encounter (Signed)
Kaede called back at 1:05 and said to send both medications to [facility] mail order.  Send 90 day supply.  Appt 10/08/22.[facility] - [place], [place] - [address]

## 2022-06-22 MED ORDER — CITALOPRAM HYDROBROMIDE 20 MG PO TABS: 20.0000 mg | ORAL_TABLET | Freq: Every day | ORAL | 0 refills | Status: AC

## 2022-06-22 NOTE — Telephone Encounter (Signed)
Sent!

## 2022-06-23 ENCOUNTER — Inpatient Hospital Stay (HOSPITAL_COMMUNITY)
Admission: EM | Admit: 2022-06-23 | Discharge: 2022-06-25 | DRG: 190 | Disposition: A | Discharge status: Home / Self Care | Payer: Medicare HMO | Attending: Family Medicine | Admitting: Family Medicine

## 2022-06-23 ENCOUNTER — Encounter (HOSPITAL_COMMUNITY): Payer: Self-pay | Admitting: Emergency Medicine

## 2022-06-23 ENCOUNTER — Other Ambulatory Visit: Payer: Self-pay

## 2022-06-23 ENCOUNTER — Emergency Department (HOSPITAL_COMMUNITY): Payer: Medicare HMO

## 2022-06-23 DIAGNOSIS — Z1152 Encounter for screening for COVID-19: Secondary | ICD-10-CM

## 2022-06-23 DIAGNOSIS — F17290 Nicotine dependence, other tobacco product, uncomplicated: Secondary | ICD-10-CM | POA: Diagnosis present

## 2022-06-23 DIAGNOSIS — J439 Emphysema, unspecified: Secondary | ICD-10-CM | POA: Diagnosis present

## 2022-06-23 DIAGNOSIS — F419 Anxiety disorder, unspecified: Secondary | ICD-10-CM | POA: Diagnosis present

## 2022-06-23 DIAGNOSIS — Z882 Allergy status to sulfonamides status: Secondary | ICD-10-CM

## 2022-06-23 DIAGNOSIS — Z825 Family history of asthma and other chronic lower respiratory diseases: Secondary | ICD-10-CM

## 2022-06-23 DIAGNOSIS — J441 Chronic obstructive pulmonary disease with (acute) exacerbation: Principal | ICD-10-CM | POA: Diagnosis present

## 2022-06-23 DIAGNOSIS — R0902 Hypoxemia: Secondary | ICD-10-CM | POA: Diagnosis present

## 2022-06-23 DIAGNOSIS — Z79620 Long term (current) use of immunosuppressive biologic: Secondary | ICD-10-CM

## 2022-06-23 DIAGNOSIS — Z7952 Long term (current) use of systemic steroids: Secondary | ICD-10-CM

## 2022-06-23 DIAGNOSIS — D72829 Elevated white blood cell count, unspecified: Secondary | ICD-10-CM

## 2022-06-23 DIAGNOSIS — Z66 Do not resuscitate: Secondary | ICD-10-CM | POA: Diagnosis present

## 2022-06-23 DIAGNOSIS — R918 Other nonspecific abnormal finding of lung field: Secondary | ICD-10-CM | POA: Diagnosis present

## 2022-06-23 DIAGNOSIS — Z818 Family history of other mental and behavioral disorders: Secondary | ICD-10-CM

## 2022-06-23 DIAGNOSIS — G479 Sleep disorder, unspecified: Secondary | ICD-10-CM | POA: Diagnosis present

## 2022-06-23 DIAGNOSIS — H3530 Unspecified macular degeneration: Secondary | ICD-10-CM | POA: Diagnosis present

## 2022-06-23 DIAGNOSIS — M1990 Unspecified osteoarthritis, unspecified site: Secondary | ICD-10-CM | POA: Diagnosis present

## 2022-06-23 DIAGNOSIS — J9601 Acute respiratory failure with hypoxia: Secondary | ICD-10-CM | POA: Diagnosis present

## 2022-06-23 DIAGNOSIS — M797 Fibromyalgia: Secondary | ICD-10-CM | POA: Diagnosis present

## 2022-06-23 DIAGNOSIS — Z7951 Long term (current) use of inhaled steroids: Secondary | ICD-10-CM

## 2022-06-23 DIAGNOSIS — D72820 Lymphocytosis (symptomatic): Secondary | ICD-10-CM | POA: Diagnosis present

## 2022-06-23 DIAGNOSIS — Z79899 Other long term (current) drug therapy: Secondary | ICD-10-CM

## 2022-06-23 DIAGNOSIS — Z8261 Family history of arthritis: Secondary | ICD-10-CM

## 2022-06-23 DIAGNOSIS — I712 Thoracic aortic aneurysm, without rupture, unspecified: Secondary | ICD-10-CM | POA: Diagnosis present

## 2022-06-23 DIAGNOSIS — Z90710 Acquired absence of both cervix and uterus: Secondary | ICD-10-CM

## 2022-06-23 DIAGNOSIS — F32A Depression, unspecified: Secondary | ICD-10-CM | POA: Diagnosis present

## 2022-06-23 DIAGNOSIS — R0602 Shortness of breath: Secondary | ICD-10-CM | POA: Diagnosis not present

## 2022-06-23 LAB — CBC WITH DIFFERENTIAL/PLATELET
Abs Immature Granulocytes: 0.09 10*3/uL — ABNORMAL HIGH (ref 0.00–0.07)
Basophils Absolute: 0.1 10*3/uL (ref 0.0–0.1)
Basophils Relative: 0 %
Eosinophils Absolute: 0.1 10*3/uL (ref 0.0–0.5)
Eosinophils Relative: 1 %
HCT: 44.8 % (ref 36.0–46.0)
Hemoglobin: 13.5 g/dL (ref 12.0–15.0)
Immature Granulocytes: 1 %
Lymphocytes Relative: 41 %
Lymphs Abs: 7.5 10*3/uL — ABNORMAL HIGH (ref 0.7–4.0)
MCH: 29.9 pg (ref 26.0–34.0)
MCHC: 30.1 g/dL (ref 30.0–36.0)
MCV: 99.3 fL (ref 80.0–100.0)
Monocytes Absolute: 1.1 10*3/uL — ABNORMAL HIGH (ref 0.1–1.0)
Monocytes Relative: 6 %
Neutro Abs: 9.2 10*3/uL — ABNORMAL HIGH (ref 1.7–7.7)
Neutrophils Relative %: 51 %
Platelets: 286 10*3/uL (ref 150–400)
RBC: 4.51 MIL/uL (ref 3.87–5.11)
RDW: 12.8 % (ref 11.5–15.5)
WBC: 18.1 10*3/uL — ABNORMAL HIGH (ref 4.0–10.5)
nRBC: 0 % (ref 0.0–0.2)

## 2022-06-23 LAB — COMPREHENSIVE METABOLIC PANEL
ALT: 22 U/L (ref 0–44)
AST: 26 U/L (ref 15–41)
Albumin: 3.8 g/dL (ref 3.5–5.0)
Alkaline Phosphatase: 53 U/L (ref 38–126)
Anion gap: 11 (ref 5–15)
BUN: 19 mg/dL (ref 8–23)
CO2: 28 mmol/L (ref 22–32)
Calcium: 8.9 mg/dL (ref 8.9–10.3)
Chloride: 103 mmol/L (ref 98–111)
Creatinine, Ser: 0.96 mg/dL (ref 0.44–1.00)
GFR, Estimated: >60 mL/min (ref >60)
Glucose, Bld: 114 mg/dL — ABNORMAL HIGH (ref 70–99)
Potassium: 3.4 mmol/L — ABNORMAL LOW (ref 3.5–5.1)
Sodium: 142 mmol/L (ref 135–145)
Total Bilirubin: 0.6 mg/dL (ref 0.3–1.2)
Total Protein: 6.9 g/dL (ref 6.5–8.1)

## 2022-06-23 LAB — BLOOD GAS, VENOUS
Acid-Base Excess: 5 mmol/L — ABNORMAL HIGH (ref 0.0–2.0)
Bicarbonate: 33.7 mmol/L — ABNORMAL HIGH (ref 20.0–28.0)
O2 Saturation: 42.8 %
Patient temperature: 37
pCO2, Ven: 70 mmHg — ABNORMAL HIGH (ref 44–60)
pH, Ven: 7.29 (ref 7.25–7.43)
pO2, Ven: <31 mmHg — CL (ref 32–45)

## 2022-06-23 LAB — BLOOD GAS, ARTERIAL
Acid-base deficit: 1.2 mmol/L (ref 0.0–2.0)
Bicarbonate: 23.6 mmol/L (ref 20.0–28.0)
Drawn by: 331471
O2 Content: 2.5 L/min
O2 Saturation: 99.3 %
Patient temperature: 37
pCO2 arterial: 39 mmHg (ref 32–48)
pH, Arterial: 7.39 (ref 7.35–7.45)
pO2, Arterial: 84 mmHg (ref 83–108)

## 2022-06-23 LAB — TROPONIN I (HIGH SENSITIVITY)
Troponin I (High Sensitivity): 12 ng/L (ref <18)
Troponin I (High Sensitivity): 9 ng/L (ref <18)

## 2022-06-23 LAB — SARS CORONAVIRUS 2 BY RT PCR: SARS Coronavirus 2 by RT PCR: NEGATIVE

## 2022-06-23 LAB — MAGNESIUM
Magnesium: 3.4 mg/dL — ABNORMAL HIGH (ref 1.7–2.4)
Magnesium: 2.8 mg/dL — ABNORMAL HIGH (ref 1.7–2.4)

## 2022-06-23 MED ORDER — IPRATROPIUM-ALBUTEROL 0.5-2.5 (3) MG/3ML IN SOLN
3.0000 mL | Freq: Once | RESPIRATORY_TRACT | Status: AC
  Administered 2022-06-23: 3 mL via RESPIRATORY_TRACT
  Filled 2022-06-23: qty 3

## 2022-06-23 MED ORDER — SODIUM CHLORIDE 0.9 % IV BOLUS
1000.0000 mL | Freq: Once | INTRAVENOUS | Status: AC
  Administered 2022-06-23: 1000 mL via INTRAVENOUS

## 2022-06-23 MED ORDER — LORAZEPAM 1 MG PO TABS
1.0000 mg | ORAL_TABLET | Freq: Once | ORAL | Status: AC
  Administered 2022-06-23: 1 mg via ORAL
  Filled 2022-06-23: qty 1

## 2022-06-23 MED ORDER — MAGNESIUM SULFATE 2 GM/50ML IV SOLN
2.0000 g | Freq: Once | INTRAVENOUS | Status: AC
  Administered 2022-06-23: 2 g via INTRAVENOUS
  Filled 2022-06-23: qty 50

## 2022-06-23 MED ORDER — MELATONIN 5 MG PO TABS
5.0000 mg | ORAL_TABLET | Freq: Every evening | ORAL | Status: DC | PRN
  Filled 2022-06-23: qty 1

## 2022-06-23 MED ORDER — ENOXAPARIN SODIUM 40 MG/0.4ML IJ SOSY
40.0000 mg | PREFILLED_SYRINGE | INTRAMUSCULAR | Status: DC
  Administered 2022-06-24 – 2022-06-25 (×2): 40 mg via SUBCUTANEOUS
  Filled 2022-06-23 (×2): qty 0.4

## 2022-06-23 MED ORDER — ACETAMINOPHEN 650 MG RE SUPP: 650.0000 mg | Freq: Four times a day (QID) | RECTAL | Status: DC | PRN

## 2022-06-23 MED ORDER — IOHEXOL 350 MG/ML SOLN
80.0000 mL | Freq: Once | INTRAVENOUS | Status: AC | PRN
  Administered 2022-06-23: 80 mL via INTRAVENOUS

## 2022-06-23 MED ORDER — DIAZEPAM 5 MG PO TABS
10.0000 mg | ORAL_TABLET | Freq: Two times a day (BID) | ORAL | Status: DC | PRN
  Administered 2022-06-23 – 2022-06-24 (×2): 10 mg via ORAL
  Filled 2022-06-23 (×2): qty 2

## 2022-06-23 MED ORDER — IPRATROPIUM-ALBUTEROL 0.5-2.5 (3) MG/3ML IN SOLN
3.0000 mL | Freq: Three times a day (TID) | RESPIRATORY_TRACT | Status: DC
  Administered 2022-06-24: 3 mL via RESPIRATORY_TRACT
  Filled 2022-06-23: qty 3

## 2022-06-23 MED ORDER — ALBUTEROL SULFATE (2.5 MG/3ML) 0.083% IN NEBU
2.5000 mg | INHALATION_SOLUTION | RESPIRATORY_TRACT | Status: DC | PRN
  Administered 2022-06-23: 2.5 mg via RESPIRATORY_TRACT
  Filled 2022-06-23 (×2): qty 3

## 2022-06-23 MED ORDER — METHYLPREDNISOLONE SODIUM SUCC 40 MG IJ SOLR
40.0000 mg | Freq: Two times a day (BID) | INTRAMUSCULAR | Status: DC
  Administered 2022-06-24 – 2022-06-25 (×3): 40 mg via INTRAVENOUS
  Filled 2022-06-23 (×4): qty 1

## 2022-06-23 MED ORDER — PANTOPRAZOLE SODIUM 20 MG PO TBEC
20.0000 mg | DELAYED_RELEASE_TABLET | Freq: Every day | ORAL | Status: DC
  Administered 2022-06-23 – 2022-06-25 (×3): 20 mg via ORAL
  Filled 2022-06-23 (×3): qty 1

## 2022-06-23 MED ORDER — ACETAMINOPHEN 325 MG PO TABS
650.0000 mg | ORAL_TABLET | Freq: Four times a day (QID) | ORAL | Status: DC | PRN
  Filled 2022-06-23: qty 2

## 2022-06-23 MED ORDER — IPRATROPIUM-ALBUTEROL 0.5-2.5 (3) MG/3ML IN SOLN
3.0000 mL | RESPIRATORY_TRACT | Status: DC
  Administered 2022-06-23: 3 mL via RESPIRATORY_TRACT
  Filled 2022-06-23: qty 3

## 2022-06-23 MED ORDER — SODIUM CHLORIDE 0.9% FLUSH
3.0000 mL | Freq: Two times a day (BID) | INTRAVENOUS | Status: DC
  Administered 2022-06-23 – 2022-06-25 (×4): 3 mL via INTRAVENOUS

## 2022-06-23 NOTE — ED Provider Notes (Addendum)
[place] [facility]ERGENCY DEPARTMENT AT [facility] Provider Note   CSN: [id number] Arrival date & time: 06/23/22  1306     History  Chief Complaint  Patient presents with   Shortness of Breath    Jasmin Munoz is a 72 y.o. female, history of emphysema, who presents to the ED secondary to shortness of breath for the last week.  She states that for the last week she has been very short of breath and cannot even ambulate or talk in full sentences because she is so short of breath.  Was started on doxycycline and prednisone about 5 days ago by her primary care doctor, but she states that she has not had any relief with this.  Notes that she has been using her maintenance inhaler present to treat, as well as her injectable, for her emphysema, but not having any relief.  Sees Dr. Judeth Horn for pulmonology.  Endorses some chest tightness, but no radiation of this.  Is relieved by DuoNebs.   Received 2 DuoNeb's by EMS as well as 125 mg of Solu-Medrol.    Home Medications Prior to Admission medications   Medication Sig Start Date End Date Taking? Authorizing Provider  albuterol (VENTOLIN HFA) 108 (90 Base) MCG/ACT inhaler Inhale 2 puffs into the lungs every 6 (six) hours as needed for wheezing or shortness of breath. 03/18/22   Hunsucker, Lesia Sago, MD  BREZTRI AEROSPHERE 160-9-4.8 MCG/ACT AERO INHALE 2 PUFFS INTO THE LUNGS IN THE MORNING AND AT BEDTIME. 01/12/22   Hunsucker, Lesia Sago, MD  citalopram (CELEXA) 20 MG tablet Take 1 tablet (20 mg total) by mouth daily. 06/22/22   Joan Flores, NP  diazepam (VALIUM) 10 MG tablet Take 1 tablet (10 mg total) by mouth 2 (two) times daily as needed for anxiety. 06/21/22   Joan Flores, NP  doxycycline (VIBRA-TABS) 100 MG tablet Take 1 tablet (100 mg total) by mouth 2 (two) times daily. 06/18/22   Hunsucker, Lesia Sago, MD  Dupilumab (DUPIXENT) 300 MG/2ML SOPN Inject 300 mg into the skin every 14 (fourteen) days. Loading dose completed in clinic on  03/18/2022 03/18/22   Hunsucker, Lesia Sago, MD  HYDROcodone-acetaminophen (NORCO/VICODIN) 5-325 MG tablet Take 1 tablet by mouth every 6 (six) hours as needed for moderate pain or severe pain. 06/12/21   [provider]  naloxone [facility]) nasal spray 4 mg/0.1 mL SMARTSIG:Both Nares 04/01/22   [provider]  predniSONE (DELTASONE) 10 MG tablet Take 1 tablet (10 mg total) by mouth daily with breakfast. 04/07/22   Hunsucker, Lesia Sago, MD  predniSONE (DELTASONE) 20 MG tablet Take 2 tablets (40 mg total) by mouth daily with breakfast for 5 days, THEN 1 tablet (20 mg total) daily with breakfast for 5 days. 06/18/22 06/28/22  Hunsucker, Lesia Sago, MD      Allergies    Sulfa antibiotics    Review of Systems   Review of Systems  Respiratory:  Positive for shortness of breath and wheezing. Negative for cough.     Physical Exam Updated Vital Signs BP (!) 153/93   Pulse 85   Temp 97.8 F (36.6 C) (Oral)   Resp 15   SpO2 100%  Physical Exam Vitals and nursing note reviewed.  Constitutional:      General: She is not in acute distress.    Appearance: She is well-developed.  HENT:     Head: Normocephalic and atraumatic.  Eyes:     Conjunctiva/sclera: Conjunctivae normal.  Cardiovascular:  Rate and Rhythm: Normal rate and regular rhythm.     Heart sounds: No murmur heard. Pulmonary:     Effort: Tachypnea present.     Breath sounds: Examination of the right-upper field reveals wheezing. Examination of the left-upper field reveals wheezing. Decreased breath sounds and wheezing present.  Abdominal:     Palpations: Abdomen is soft.     Tenderness: There is no abdominal tenderness.  Musculoskeletal:        General: No swelling.     Cervical back: Neck supple.  Skin:    General: Skin is warm and dry.     Capillary Refill: Capillary refill takes less than 2 seconds.  Neurological:     Mental Status: She is alert.     Comments: Tremulous  Psychiatric:        Mood and Affect:  Mood normal.     ED Results / Procedures / Treatments   Labs (all labs ordered are listed, but only abnormal results are displayed) Labs Reviewed  MAGNESIUM - Abnormal; Notable for the following components:      Result Value   Magnesium 3.4 (*)    All other components within normal limits  CBC WITH DIFFERENTIAL/PLATELET - Abnormal; Notable for the following components:   WBC 18.1 (*)    All other components within normal limits  COMPREHENSIVE METABOLIC PANEL - Abnormal; Notable for the following components:   Potassium 3.4 (*)    Glucose, Bld 114 (*)    All other components within normal limits  BLOOD GAS, VENOUS - Abnormal; Notable for the following components:   pCO2, Ven 70 (*)    pO2, Ven <31 (*)    Bicarbonate 33.7 (*)    Acid-Base Excess 5.0 (*)    All other components within normal limits  PATHOLOGIST SMEAR REVIEW  TROPONIN I (HIGH SENSITIVITY)  TROPONIN I (HIGH SENSITIVITY)    EKG EKG Interpretation  Date/Time:  Wednesday Jun 23 2022 13:22:05 EDT Ventricular Rate:  91 PR Interval:  156 QRS Duration: 74 QT Interval:  389 QTC Calculation: 479 R Axis:   51 Text Interpretation: Sinus rhythm Paired ventricular premature complexes Right atrial enlargement Borderline T wave abnormalities Artifact in lead(s) I II V3 V4 V5 V6 Confirmed by Lorre Nick ([id number]) on 06/23/2022 2:21:14 PM  Radiology DG Chest 2 View  Result Date: 06/23/2022 CLINICAL DATA:  Shortness of breath.  History of emphysema. EXAM: CHEST - 2 VIEW COMPARISON:  Chest radiographs 03/22/2022 and 09/09/2021; CT chest 10/02/2021 FINDINGS: Cardiac silhouette and mediastinal contours are within normal limits. Mild calcification within aortic arch. There is flattening of the diaphragms and moderate hyperinflation, unchanged. There increased lucencies within the bilateral lungs, especially superiorly, consistent with the moderate chronic emphysematous changes seen on prior CT. No acute airspace opacity.  Right-greater-than-left apical scarring, similar to prior radiographs and CT. No pleural effusion pneumothorax. Moderate multilevel degenerative disc changes of the thoracic spine. IMPRESSION: 1. No acute lung process. 2. Moderate chronic emphysematous changes, similar to prior CT. Electronically Signed   By: Neita Garnet M.D.   On: 06/23/2022 14:10    Procedures Procedures    Medications Ordered in ED Medications  ipratropium-albuterol (DUONEB) 0.5-2.5 (3) MG/3ML nebulizer solution 3 mL (3 mLs Nebulization Given 06/23/22 1412)  sodium chloride 0.9 % bolus 1,000 mL (1,000 mLs Intravenous New Bag/Given 06/23/22 1411)  LORazepam (ATIVAN) tablet 1 mg (1 mg Oral Given 06/23/22 1411)  magnesium sulfate IVPB 2 g 50 mL (2 g Intravenous New Bag/Given 06/23/22 1411)  ED Course/ Medical Decision Making/ A&P                             Medical Decision Making Patient is a 72 year old female, history of emphysema, who presents here secondary to shortness of breath has been going on for the last week, that is worsening.  She has tried DuoNebs, doxycycline, prednisone outpatient, but is not improving.  She is alert has labored respirations, is tachypneic, and has diminished breath sounds, we will give her magnesium as she is already received Solu-Medrol by EMS, as well as Ativan as she is tremulous, possibly secondary to anxiety due to his shortness of breath versus use of albuterol.  Additionally will have respiratory eval.  Labs obtained.  Amount and/or Complexity of Data Reviewed Labs: ordered.    Details: Leukocytosis of 18,000, negative troponin, mag within normal limits. Radiology: ordered.    Details: Chest x-ray shows no acute findings just finding similar for COPD ECG/medicine tests:  Decision-making details documented in ED Course.    Details: Sinus rhythm Discussion of management or test interpretation with external provider(s): Patient somewhat improved, less reduced breath sounds, is able  to take deeper breaths now.  However given that she has been refractory to outpatient treatment, and is still having a difficult time working breathing, we will admit her for COPD exacerbation.  Discussed with Dr. Maryjean Ka, accepts admission of patient.  Requires frequent nebulizers, and further management inpatient for COPD.  Risk Prescription drug management. Decision regarding hospitalization.    Final Clinical Impression(s) / ED Diagnoses Final diagnoses:  COPD exacerbation [facility])    Rx / DC Orders ED Discharge Orders     None         Khalise Billard, [place], PA 06/23/22 1645    Ronnesha Mester, [place], [place] 06/23/22 1835    Lorre Nick, MD 06/25/22 [phone]

## 2022-06-23 NOTE — H&P (Addendum)
History and Physical    Patient: Jasmin Munoz ZOX:[id number] DOB: Aug 04, 1950 DOA: 06/23/2022 DOS: the patient was seen and examined on 06/23/2022 PCP: [facility]  Patient coming from: Home  Chief Complaint:  Chief Complaint  Patient presents with   Shortness of Breath   HPI: Jasmin Munoz is a 72 y.o. female with medical history significant of ED.  However patient reports not using oxygen at home.  Patient was in her usual state of health till about 5 days ago when she reports a new onset of worsening shortness of breath especially when she would walk across the room.  Since then the shortness of had breath has progressed and patient was short of breath even at rest today.  There is no associated cough chest pain presyncope palpitations or leg swelling.  Patient did start outpatient prednisone and doxycycline about 5 days ago with poor symptomatic response.  Patient comes to the ER because of ongoing symptoms.  ER workup negative for high hypoxia on pulse oximetry.  Patient has received inhaled bronchodilators.  In spite of this patient continues to have symptoms of shortness of breath and medical evaluation is sought.   Review of Systems: As mentioned in the history of present illness. All other systems reviewed and are negative. Past Medical History:  Diagnosis Date   Anxiety    Arthritis    Cataract    COPD (chronic obstructive pulmonary disease) (HCC)    Depression    Fibromyalgia    Macular degeneration    Seizures (HCC)    Past Surgical History:  Procedure Laterality Date   ABDOMINAL HYSTERECTOMY     Social History:  reports that she quit smoking about 3 months ago. Her smoking use included cigarettes. She has a 35.00 pack-year smoking history. She uses smokeless tobacco. She reports that she does not drink alcohol and does not use drugs.  Allergies  Allergen Reactions   Sulfa Antibiotics Hives    Family History  Problem Relation Age of Onset    Emphysema Mother    Arthritis Father    Anxiety disorder Sister    Drug abuse Sister    Depression Sister    Alcohol abuse Sister    Bipolar disorder Sister    Arthritis Sister    Joint hypermobility Brother     Prior to Admission medications   Medication Sig Start Date End Date Taking? Authorizing Provider  albuterol (VENTOLIN HFA) 108 (90 Base) MCG/ACT inhaler Inhale 2 puffs into the lungs every 6 (six) hours as needed for wheezing or shortness of breath. 03/18/22   Hunsucker, Lesia Sago, MD  BREZTRI AEROSPHERE 160-9-4.8 MCG/ACT AERO INHALE 2 PUFFS INTO THE LUNGS IN THE MORNING AND AT BEDTIME. 01/12/22   Hunsucker, Lesia Sago, MD  citalopram (CELEXA) 20 MG tablet Take 1 tablet (20 mg total) by mouth daily. 06/22/22   Joan Flores, NP  diazepam (VALIUM) 10 MG tablet Take 1 tablet (10 mg total) by mouth 2 (two) times daily as needed for anxiety. 06/21/22   Joan Flores, NP  doxycycline (VIBRA-TABS) 100 MG tablet Take 1 tablet (100 mg total) by mouth 2 (two) times daily. 06/18/22   Hunsucker, Lesia Sago, MD  Dupilumab (DUPIXENT) 300 MG/2ML SOPN Inject 300 mg into the skin every 14 (fourteen) days. Loading dose completed in clinic on 03/18/2022 03/18/22   Hunsucker, Lesia Sago, MD  HYDROcodone-acetaminophen (NORCO/VICODIN) 5-325 MG tablet Take 1 tablet by mouth every 6 (six) hours as needed for moderate pain or  severe pain. 06/12/21   [provider]  naloxone [facility]) nasal spray 4 mg/0.1 mL SMARTSIG:Both Nares 04/01/22   [provider]  predniSONE (DELTASONE) 10 MG tablet Take 1 tablet (10 mg total) by mouth daily with breakfast. 04/07/22   Hunsucker, Lesia Sago, MD  predniSONE (DELTASONE) 20 MG tablet Take 2 tablets (40 mg total) by mouth daily with breakfast for 5 days, THEN 1 tablet (20 mg total) daily with breakfast for 5 days. 06/18/22 06/28/22  Hunsucker, Lesia Sago, MD    Physical Exam: Vitals:   06/23/22 1630 06/23/22 1700 06/23/22 1730 06/23/22 1746  BP: (!) 146/91 (!)  142/85 130/86   Pulse: 87 87 91   Resp: 16 18 (!) 23   Temp:    98.3 F (36.8 C)  TempSrc:    Oral  SpO2: 100% 99% 99%    General: Patient did not appear to be in distress initially as she was laying, however turning in bed and talking seem to cause tachypnea.  Patient gives a coherent account of her history Respiratory exam: Anterior air entry is good, there is an upper airway wheeze heard during expiration.  However posterior basilar air entry is markedly diminished with some dullness to percussion. Cardiovascular exam S1-S2 normal Abdomen soft nontender Extremities warm without edema. Data Reviewed:  Labs on Admission:  Results for orders placed or performed during the hospital encounter of 06/23/22 (from the past 24 hour(s))  Magnesium     Status: Abnormal   Collection Time: 06/23/22  1:31 PM  Result Value Ref Range   Magnesium 3.4 (H) 1.7 - 2.4 mg/dL  Troponin I (High Sensitivity)     Status: None   Collection Time: 06/23/22  1:31 PM  Result Value Ref Range   Troponin I (High Sensitivity) 12 <18 ng/L  CBC with Differential     Status: Abnormal   Collection Time: 06/23/22  1:31 PM  Result Value Ref Range   WBC 18.1 (H) 4.0 - 10.5 K/uL   RBC 4.51 3.87 - 5.11 MIL/uL   Hemoglobin 13.5 12.0 - 15.0 g/dL   HCT 16.1 09.6 - 04.5 %   MCV 99.3 80.0 - 100.0 fL   MCH 29.9 26.0 - 34.0 pg   MCHC 30.1 30.0 - 36.0 g/dL   RDW 40.9 81.1 - 91.4 %   Platelets 286 150 - 400 K/uL   nRBC 0.0 0.0 - 0.2 %   Neutrophils Relative % 51 %   Neutro Abs 9.2 (H) 1.7 - 7.7 K/uL   Lymphocytes Relative 41 %   Lymphs Abs 7.5 (H) 0.7 - 4.0 K/uL   Monocytes Relative 6 %   Monocytes Absolute 1.1 (H) 0.1 - 1.0 K/uL   Eosinophils Relative 1 %   Eosinophils Absolute 0.1 0.0 - 0.5 K/uL   Basophils Relative 0 %   Basophils Absolute 0.1 0.0 - 0.1 K/uL   WBC Morphology ATYPICAL LYMPHOCYTES    Immature Granulocytes 1 %   Abs Immature Granulocytes 0.09 (H) 0.00 - 0.07 K/uL   Large Granular Lymphocytes PRESENT    Comprehensive metabolic panel     Status: Abnormal   Collection Time: 06/23/22  1:31 PM  Result Value Ref Range   Sodium 142 135 - 145 mmol/L   Potassium 3.4 (L) 3.5 - 5.1 mmol/L   Chloride 103 98 - 111 mmol/L   CO2 28 22 - 32 mmol/L   Glucose, Bld 114 (H) 70 - 99 mg/dL   BUN 19 8 - 23 mg/dL  Creatinine, Ser 0.96 0.44 - 1.00 mg/dL   Calcium 8.9 8.9 - 40.9 mg/dL   Total Protein 6.9 6.5 - 8.1 g/dL   Albumin 3.8 3.5 - 5.0 g/dL   AST 26 15 - 41 U/L   ALT 22 0 - 44 U/L   Alkaline Phosphatase 53 38 - 126 U/L   Total Bilirubin 0.6 0.3 - 1.2 mg/dL   GFR, Estimated >81 >19 mL/min   Anion gap 11 5 - 15  Blood gas, venous     Status: Abnormal   Collection Time: 06/23/22  2:30 PM  Result Value Ref Range   pH, Ven 7.29 7.25 - 7.43   pCO2, Ven 70 (H) 44 - 60 mmHg   pO2, Ven <31 (LL) 32 - 45 mmHg   Bicarbonate 33.7 (H) 20.0 - 28.0 mmol/L   Acid-Base Excess 5.0 (H) 0.0 - 2.0 mmol/L   O2 Saturation 42.8 %   Patient temperature 37.0    Basic Metabolic Panel: Recent Labs  Lab 06/23/22 1331  NA 142  K 3.4*  CL 103  CO2 28  GLUCOSE 114*  BUN 19  CREATININE 0.96  CALCIUM 8.9  MG 3.4*   Liver Function Tests: Recent Labs  Lab 06/23/22 1331  AST 26  ALT 22  ALKPHOS 53  BILITOT 0.6  PROT 6.9  ALBUMIN 3.8   No results for input(s): "LIPASE", "AMYLASE" in the last 168 hours. No results for input(s): "AMMONIA" in the last 168 hours. CBC: Recent Labs  Lab 06/23/22 1331  WBC 18.1*  NEUTROABS 9.2*  HGB 13.5  HCT 44.8  MCV 99.3  PLT 286   Cardiac Enzymes: Recent Labs  Lab 06/23/22 1331  TROPONINIHS 12    BNP (last 3 results) No results for input(s): "PROBNP" in the last 8760 hours. CBG: No results for input(s): "GLUCAP" in the last 168 hours.  Radiological Exams on Admission:  DG Chest 2 View  Result Date: 06/23/2022 CLINICAL DATA:  Shortness of breath.  History of emphysema. EXAM: CHEST - 2 VIEW COMPARISON:  Chest radiographs 03/22/2022 and 09/09/2021; CT chest  10/02/2021 FINDINGS: Cardiac silhouette and mediastinal contours are within normal limits. Mild calcification within aortic arch. There is flattening of the diaphragms and moderate hyperinflation, unchanged. There increased lucencies within the bilateral lungs, especially superiorly, consistent with the moderate chronic emphysematous changes seen on prior CT. No acute airspace opacity. Right-greater-than-left apical scarring, similar to prior radiographs and CT. No pleural effusion pneumothorax. Moderate multilevel degenerative disc changes of the thoracic spine. IMPRESSION: 1. No acute lung process. 2. Moderate chronic emphysematous changes, similar to prior CT. Electronically Signed   By: Neita Garnet M.D.   On: 06/23/2022 14:10    EKG: Independently reviewed. NSR   Assessment and Plan: * COPD with acute exacerbation (HCC) Not associated with acute hypoxemia as per pulse oximetry.  However finding of hypercapnia on VBG.  Given poor symptomatic response of patient to therapy offered thus far, I do feel that we should get a CAT scan and make sure were not missing a pleural effusion or pneumonia given marked leukocytosis.  Also we should get an ABG to and make sure that were not missing a hypoxemia that is being mixed on pulse oximetry.  We will continue with methylprednisolone for tonight and inhaled bronchodilators.  At this time I do not see an indication for starting IV antibiotics for this patient.  Outpatient course of doxycycline can be finished  Hypermagnesemia Dental finding.  However patient subsequently received additional magnesium  treatment as part of COPD management.  Therefore I will check magnesium level again and make sure were not having sending toxic levels.  Leukocytosis Patient with both neutrophilia as well as leuk lymphocytosis. Lymphocytosis is slightly surprising as patient has been on prednisone recently.  Also some atypical lymphocytes are reported by peripheral smear review.   This will need hematology follow-up. I will order flow cytometry   Please revisit Med-Rec sheet in AM     Advance Care Planning:   Code Status: DNR patient advises me that in case of cardiopulmonary arrest she would not want to be resuscitated.  Consults: None  Family Communication: As per patient  Severity of Illness: The appropriate patient status for this patient is OBSERVATION. Observation status is judged to be reasonable and necessary in order to provide the required intensity of service to ensure the patient's safety. The patient's presenting symptoms, physical exam findings, and initial radiographic and laboratory data in the context of their medical condition is felt to place them at decreased risk for further clinical deterioration. Furthermore, it is anticipated that the patient will be medically stable for discharge from the hospital within 2 midnights of admission.   Author: Nolberto Hanlon, MD 06/23/2022 6:29 PM  For on call review [email or web address].

## 2022-06-23 NOTE — ED Provider Notes (Signed)
I provided a substantive portion of the care of this patient.  I personally made/approved the management plan for this patient and take responsibility for the patient management.  EKG Interpretation  Date/Time:  Wednesday Jun 23 2022 13:22:05 EDT Ventricular Rate:  91 PR Interval:  156 QRS Duration: 74 QT Interval:  389 QTC Calculation: 479 R Axis:   51 Text Interpretation: Sinus rhythm Paired ventricular premature complexes Right atrial enlargement Borderline T wave abnormalities Artifact in lead(s) I II V3 V4 V5 V6 Confirmed by Lorre Nick ([id number]) on 06/23/2022 2:21:47 PM    72 year old who presented with shortness of breath.  History of COPD.  Patient given medical therapy for this.  Chest x-ray shows no acute lung process per interpretation.  EKG shows no acute coronary process.  Plan will be for admission   Lorre Nick, MD 06/23/22 1423

## 2022-06-23 NOTE — ED Triage Notes (Signed)
Patient presents with shortness of breath since Friday. Nebs did not help her. EMS noted wheezing in all lung fields and was 94% SPO2 on room air. EMS administered 2 duo nebs, 125 solu and 2 mag   EMS vitals: 157/98 BP 94 HR 100 % SPO2 on neb 99 CBG

## 2022-06-23 NOTE — Assessment & Plan Note (Signed)
Not associated with acute hypoxemia as per pulse oximetry.  However finding of hypercapnia on VBG.  Given poor symptomatic response of patient to therapy offered thus far, I do feel that we should get a CAT scan and make sure were not missing a pleural effusion or pneumonia given marked leukocytosis.  Also we should get an ABG to and make sure that were not missing a hypoxemia that is being mixed on pulse oximetry.  We will continue with methylprednisolone for tonight and inhaled bronchodilators.  At this time I do not see an indication for starting IV antibiotics for this patient.  Outpatient course of doxycycline can be finished

## 2022-06-23 NOTE — Assessment & Plan Note (Signed)
Dental finding.  However patient subsequently received additional magnesium treatment as part of COPD management.  Therefore I will check magnesium level again and make sure were not having sending toxic levels.

## 2022-06-23 NOTE — Plan of Care (Signed)
Discuss and review plan of care with patient/family  

## 2022-06-23 NOTE — Assessment & Plan Note (Addendum)
Patient with both neutrophilia as well as leuk lymphocytosis. Lymphocytosis is slightly surprising as patient has been on prednisone recently.  Also some atypical lymphocytes are reported by peripheral smear review.  This will need hematology follow-up. I will order flow cytometry

## 2022-06-24 DIAGNOSIS — R918 Other nonspecific abnormal finding of lung field: Secondary | ICD-10-CM | POA: Diagnosis present

## 2022-06-24 DIAGNOSIS — J439 Emphysema, unspecified: Secondary | ICD-10-CM | POA: Diagnosis present

## 2022-06-24 DIAGNOSIS — Z818 Family history of other mental and behavioral disorders: Secondary | ICD-10-CM | POA: Diagnosis not present

## 2022-06-24 DIAGNOSIS — G479 Sleep disorder, unspecified: Secondary | ICD-10-CM | POA: Diagnosis present

## 2022-06-24 DIAGNOSIS — Z79620 Long term (current) use of immunosuppressive biologic: Secondary | ICD-10-CM | POA: Diagnosis not present

## 2022-06-24 DIAGNOSIS — Z90710 Acquired absence of both cervix and uterus: Secondary | ICD-10-CM | POA: Diagnosis not present

## 2022-06-24 DIAGNOSIS — R0609 Other forms of dyspnea: Secondary | ICD-10-CM | POA: Diagnosis not present

## 2022-06-24 DIAGNOSIS — R0902 Hypoxemia: Secondary | ICD-10-CM | POA: Diagnosis present

## 2022-06-24 DIAGNOSIS — Z66 Do not resuscitate: Secondary | ICD-10-CM | POA: Diagnosis present

## 2022-06-24 DIAGNOSIS — R0602 Shortness of breath: Secondary | ICD-10-CM | POA: Diagnosis present

## 2022-06-24 DIAGNOSIS — Z882 Allergy status to sulfonamides status: Secondary | ICD-10-CM | POA: Diagnosis not present

## 2022-06-24 DIAGNOSIS — Z8261 Family history of arthritis: Secondary | ICD-10-CM | POA: Diagnosis not present

## 2022-06-24 DIAGNOSIS — Z825 Family history of asthma and other chronic lower respiratory diseases: Secondary | ICD-10-CM | POA: Diagnosis not present

## 2022-06-24 DIAGNOSIS — M797 Fibromyalgia: Secondary | ICD-10-CM | POA: Diagnosis present

## 2022-06-24 DIAGNOSIS — M1990 Unspecified osteoarthritis, unspecified site: Secondary | ICD-10-CM | POA: Diagnosis present

## 2022-06-24 DIAGNOSIS — J9601 Acute respiratory failure with hypoxia: Secondary | ICD-10-CM | POA: Diagnosis present

## 2022-06-24 DIAGNOSIS — Z1152 Encounter for screening for COVID-19: Secondary | ICD-10-CM | POA: Diagnosis not present

## 2022-06-24 DIAGNOSIS — F32A Depression, unspecified: Secondary | ICD-10-CM | POA: Diagnosis present

## 2022-06-24 DIAGNOSIS — F17290 Nicotine dependence, other tobacco product, uncomplicated: Secondary | ICD-10-CM | POA: Diagnosis present

## 2022-06-24 DIAGNOSIS — Z7952 Long term (current) use of systemic steroids: Secondary | ICD-10-CM | POA: Diagnosis not present

## 2022-06-24 DIAGNOSIS — Z7951 Long term (current) use of inhaled steroids: Secondary | ICD-10-CM | POA: Diagnosis not present

## 2022-06-24 DIAGNOSIS — H3530 Unspecified macular degeneration: Secondary | ICD-10-CM | POA: Diagnosis present

## 2022-06-24 DIAGNOSIS — I712 Thoracic aortic aneurysm, without rupture, unspecified: Secondary | ICD-10-CM | POA: Diagnosis present

## 2022-06-24 DIAGNOSIS — J441 Chronic obstructive pulmonary disease with (acute) exacerbation: Secondary | ICD-10-CM | POA: Diagnosis not present

## 2022-06-24 DIAGNOSIS — D72820 Lymphocytosis (symptomatic): Secondary | ICD-10-CM | POA: Diagnosis present

## 2022-06-24 DIAGNOSIS — F419 Anxiety disorder, unspecified: Secondary | ICD-10-CM | POA: Diagnosis present

## 2022-06-24 LAB — RESPIRATORY PANEL BY PCR

## 2022-06-24 LAB — BASIC METABOLIC PANEL
Anion gap: 11 (ref 5–15)
BUN: 20 mg/dL (ref 8–23)
CO2: 24 mmol/L (ref 22–32)
Calcium: 8 mg/dL — ABNORMAL LOW (ref 8.9–10.3)
Chloride: 105 mmol/L (ref 98–111)
Creatinine, Ser: 1.01 mg/dL — ABNORMAL HIGH (ref 0.44–1.00)
GFR, Estimated: 60 mL/min — ABNORMAL LOW (ref >60)
Glucose, Bld: 121 mg/dL — ABNORMAL HIGH (ref 70–99)
Potassium: 3.3 mmol/L — ABNORMAL LOW (ref 3.5–5.1)
Sodium: 140 mmol/L (ref 135–145)

## 2022-06-24 LAB — CBC
HCT: 38 % (ref 36.0–46.0)
Hemoglobin: 11.7 g/dL — ABNORMAL LOW (ref 12.0–15.0)
MCH: 30.4 pg (ref 26.0–34.0)
MCHC: 30.8 g/dL (ref 30.0–36.0)
MCV: 98.7 fL (ref 80.0–100.0)
Platelets: 229 10*3/uL (ref 150–400)
RBC: 3.85 MIL/uL — ABNORMAL LOW (ref 3.87–5.11)
RDW: 13 % (ref 11.5–15.5)
WBC: 14.7 10*3/uL — ABNORMAL HIGH (ref 4.0–10.5)
nRBC: 0 % (ref 0.0–0.2)

## 2022-06-24 LAB — MAGNESIUM: Magnesium: 2.7 mg/dL — ABNORMAL HIGH (ref 1.7–2.4)

## 2022-06-24 LAB — SURGICAL PATHOLOGY

## 2022-06-24 LAB — PROTIME-INR
INR: 1 (ref 0.8–1.2)
Prothrombin Time: 13.4 seconds (ref 11.4–15.2)

## 2022-06-24 LAB — APTT: aPTT: 24 seconds (ref 24–36)

## 2022-06-24 MED ORDER — HYDROCODONE-ACETAMINOPHEN 5-325 MG PO TABS
1.0000 | ORAL_TABLET | Freq: Four times a day (QID) | ORAL | Status: DC | PRN
  Administered 2022-06-24: 1 via ORAL
  Filled 2022-06-24: qty 1

## 2022-06-24 MED ORDER — REVEFENACIN 175 MCG/3ML IN SOLN
175.0000 ug | Freq: Every day | RESPIRATORY_TRACT | Status: DC
  Administered 2022-06-25: 175 ug via RESPIRATORY_TRACT
  Filled 2022-06-24 (×2): qty 3

## 2022-06-24 MED ORDER — BUDESONIDE 0.25 MG/2ML IN SUSP
0.2500 mg | Freq: Two times a day (BID) | RESPIRATORY_TRACT | Status: DC
  Administered 2022-06-24 – 2022-06-25 (×3): 0.25 mg via RESPIRATORY_TRACT
  Filled 2022-06-24 (×3): qty 2

## 2022-06-24 MED ORDER — ARFORMOTEROL TARTRATE 15 MCG/2ML IN NEBU
15.0000 ug | INHALATION_SOLUTION | Freq: Two times a day (BID) | RESPIRATORY_TRACT | Status: DC
  Administered 2022-06-24 – 2022-06-25 (×2): 15 ug via RESPIRATORY_TRACT
  Filled 2022-06-24 (×4): qty 2

## 2022-06-24 MED ORDER — POTASSIUM CHLORIDE CRYS ER 20 MEQ PO TBCR
20.0000 meq | EXTENDED_RELEASE_TABLET | Freq: Once | ORAL | Status: AC
  Administered 2022-06-24: 20 meq via ORAL
  Filled 2022-06-24: qty 1

## 2022-06-24 MED ORDER — IPRATROPIUM-ALBUTEROL 0.5-2.5 (3) MG/3ML IN SOLN
3.0000 mL | RESPIRATORY_TRACT | Status: DC | PRN
  Administered 2022-06-24: 3 mL via RESPIRATORY_TRACT
  Filled 2022-06-24: qty 3

## 2022-06-24 MED ORDER — TRAZODONE HCL 50 MG PO TABS
50.0000 mg | ORAL_TABLET | Freq: Every evening | ORAL | Status: DC | PRN
  Administered 2022-06-24: 50 mg via ORAL
  Filled 2022-06-24: qty 1

## 2022-06-24 MED ORDER — TRAZODONE HCL 50 MG PO TABS: 50.0000 mg | ORAL_TABLET | Freq: Every day | ORAL | Status: DC

## 2022-06-24 NOTE — Progress Notes (Signed)
  Transition of Care [facility]) Screening Note   Patient Details  Name: Jasmin Munoz Date of Birth: Aug 30, 1950   [facility]) CM/SW Contact:    Otelia Santee, LCSW Phone Number: 06/24/2022, 10:27 AM    Transition of Care Department [facility]) has reviewed patient and no TOC needs have been identified at this time. We will continue to monitor patient advancement through interdisciplinary progression rounds. If new patient transition needs arise, please place a TOC consult.

## 2022-06-24 NOTE — Progress Notes (Signed)
Mobility Specialist - Progress Note   06/24/22 0953  Mobility  Activity Ambulated with assistance in hallway  Level of Assistance Standby assist, set-up cues, supervision of patient - no hands on  Assistive Device None  Distance Ambulated (ft) 500 ft  Range of Motion/Exercises Active  Activity Response Tolerated well  Mobility Referral Yes  $Mobility charge 1 Mobility  Mobility Specialist Start Time (ACUTE ONLY) 0945  Mobility Specialist Stop Time (ACUTE ONLY) 0954  Mobility Specialist Time Calculation (min) (ACUTE ONLY) 9 min   Pt received in bed and agreed to mobility. Began on RA, SpO2% was at 94%. After ambulating 250 feet, SpO2% dropped to 85%, after 1 minute of deep breathing and resting returned to 93%.  Returned to room where SpO2% dropped to 83%, did not recover on RA, placed [place] on 2L and returned to 95%.  Pt in bed with all needs met.  Marilynne Halsted Mobility Specialist

## 2022-06-24 NOTE — Progress Notes (Addendum)
    Patient Name: Jasmin Munoz           DOB: 04/23/1950  MRN: [id number]      Admission Date: 06/23/2022  Attending Provider: Tyrone Nine, MD  Primary Diagnosis: COPD with acute exacerbation [facility])   Level of care: Med-Surg    CROSS COVER NOTE   Date of Service   06/24/2022   Jasmin Munoz, 72 y.o. female, was admitted on 06/23/2022 for COPD with acute exacerbation (HCC).    HPI/Events of Note   Nursing staff reports patient is wanting to leave Against Medical Advice, she has been persistent and requesting this even during daytime.  Patient remains on 2 L O2, and desats on room air and when ambulating.  RN has requested I speak with patient.  At bedside, patient is alert and oriented x 4.  No distress noted.  She appears calm and comfortable in bed.  I spent time discussing her treatment plan and reasons for needing to be hospitalized.  I also educated her on AMA risks for medical complications like Death and Disability.   Overall, patient expressed sadness and depression related to "heartbreak from family matters." This seems to be contributing to her decision to leave AMA. Patient was hesitant and did not want to discuss further personal matter, however she agreeded to stay the night in the hospital after our discussion.   She endorses having difficulty sleeping.  Patient reports melatonin has not helped in the past.  Will trial trazodone for sleep.  Nursing staff has been asked to cluster care and minimize sleep interruption.    Interventions/ Plan   X        Anthoney Harada, DNP, ACNPC- AG [facility]

## 2022-06-24 NOTE — Consult Note (Signed)
NAME:  Jasmin Munoz, MRN:  [id number], DOB:  06-10-1950, LOS: 0 ADMISSION DATE:  06/23/2022, CONSULTATION DATE:  06/24/22 REFERRING MD:  TRH, CHIEF COMPLAINT:  DOE   History of Present Illness:  72 y.o. woman followed in pulmonary clinic for asthma and COPD overlap with history of recurrent exacerbations,, steroid-dependent, recently some improvement with initiation of biologic therapy, presents with concern for exacerbation of underlying lung disease now hospitalized given lack of improvement with outpatient management.  Contacted pulmonary office last week.  Worsening shortness of breath.  Called in prednisone taper as well as course of doxycycline.  Despite taking this for few days she was no better.  She presented to the ED.  Chest x-ray unchanged.  Given relative lack of cough concern for PE contributing symptoms of shortness of breath.  CTA PE protocol was performed and showed, no acute changes, pneumonia etc. in the chest on my review interpretation.  She had a first dose of steroids this morning.  Has not had a lot of breathing treatments in the interim either.  Pertinent  Medical History  COPD, asthma  Significant Hospital Events: Including procedures, antibiotic start and stop dates in addition to other pertinent events   5/22 admitted with COPD exacerbation with failure to respond to outpatient treatment, CTA negative for PE or other infiltrate  Interim History / Subjective:    Objective   Blood pressure (!) 151/98, pulse 75, temperature 97.7 F (36.5 C), temperature source Oral, resp. rate 20, height 5\' 1"  (1.549 m), weight 51.1 kg, SpO2 100 %.        Intake/Output Summary (Last 24 hours) at 06/24/2022 1143 Last data filed at 06/23/2022 2300 Gross per 24 hour  Intake 1280.8 ml  Output --  Net 1280.8 ml   Filed Weights   06/23/22 2112  Weight: 51.1 kg    Examination: General: Frail, chronically ill-appearing, lying in bed Neck: Supple, no JVP Eyes: EOMI,  icterus Lungs: Clear, distant Cardiovascular: Warm, no edema Abdomen: Nondistended, bowel sounds present MSK: No synovitis, no joint effusions Neuro: No deficits, sensation intact Psych: Normal mood, full affect  Resolved Hospital Problem list     Assessment & Plan:  Acute exacerbation of underlying obstructive lung disease, asthma/COPD overlap on biologic therapy, steroid-dependent: Not responded to outpatient prednisone and antibiotic therapy.  Chest images clear.  No PE on CTA PE protocol. -- Continue IV steroids, plan 48 to 72 hours and hopefully will see some improvement in being able to wean to oral steroids -- Yupelri, arformoterol, budesonide ordered, DuoNebs changed to as needed  DOE: Suspect related to the above. TTE for further evaluation given lack of response to initial treatment.   Best Practice (right click and "Reselect all SmartList Selections" daily)   Per primary  Labs   CBC: Recent Labs  Lab 06/23/22 1331 06/24/22 0104  WBC 18.1* 14.7*  NEUTROABS 9.2*  --   HGB 13.5 11.7*  HCT 44.8 38.0  MCV 99.3 98.7  PLT 286 229    Basic Metabolic Panel: Recent Labs  Lab 06/23/22 1331 06/23/22 1858 06/24/22 0104  NA 142  --  140  K 3.4*  --  3.3*  CL 103  --  105  CO2 28  --  24  GLUCOSE 114*  --  121*  BUN 19  --  20  CREATININE 0.96  --  1.01*  CALCIUM 8.9  --  8.0*  MG 3.4* 2.8* 2.7*   GFR: Estimated Creatinine Clearance: 38.6 mL/min (A) (  by C-G formula based on SCr of 1.01 mg/dL (H)). Recent Labs  Lab 06/23/22 1331 06/24/22 0104  WBC 18.1* 14.7*    Liver Function Tests: Recent Labs  Lab 06/23/22 1331  AST 26  ALT 22  ALKPHOS 53  BILITOT 0.6  PROT 6.9  ALBUMIN 3.8   No results for input(s): "LIPASE", "AMYLASE" in the last 168 hours. No results for input(s): "AMMONIA" in the last 168 hours.  ABG    Component Value Date/Time   PHART 7.39 06/23/2022 1850   PCO2ART 39 06/23/2022 1850   PO2ART 84 06/23/2022 1850   HCO3 23.6  06/23/2022 1850   ACIDBASEDEF 1.2 06/23/2022 1850   O2SAT 99.3 06/23/2022 1850     Coagulation Profile: Recent Labs  Lab 06/24/22 0104  INR 1.0    Cardiac Enzymes: No results for input(s): "CKTOTAL", "CKMB", "CKMBINDEX", "TROPONINI" in the last 168 hours.  HbA1C: Hemoglobin A1C  Date/Time Value Ref Range Status  10/17/2019 04:09 PM 5.3 4.0 - 5.6 % Final   Hgb A1c MFr Bld  Date/Time Value Ref Range Status  09/14/2017 06:29 AM 5.0 4.8 - 5.6 % Final    Comment:    (NOTE) Pre diabetes:          5.7%-6.4% Diabetes:              >6.4% Glycemic control for   <7.0% adults with diabetes     CBG: No results for input(s): "GLUCAP" in the last 168 hours.  Review of Systems:   No chest pain with exertion.  No orthopnea or PND.  Comprehensive review of systems otherwise negative.  Past Medical History:  She,  has a past medical history of Anxiety, Arthritis, Cataract, COPD (chronic obstructive pulmonary disease) (HCC), Depression, Fibromyalgia, Macular degeneration, and Seizures (HCC).   Surgical History:   Past Surgical History:  Procedure Laterality Date   ABDOMINAL HYSTERECTOMY       Social History:   reports that she quit smoking about 3 months ago. Her smoking use included cigarettes. She has a 35.00 pack-year smoking history. She uses smokeless tobacco. She reports that she does not drink alcohol and does not use drugs.   Family History:  Her family history includes Alcohol abuse in her sister; Anxiety disorder in her sister; Arthritis in her father and sister; Bipolar disorder in her sister; Depression in her sister; Drug abuse in her sister; Emphysema in her mother; Joint hypermobility in her brother.   Allergies Allergies  Allergen Reactions   Sulfa Antibiotics Hives     Home Medications  Prior to Admission medications   Medication Sig Start Date End Date Taking? Authorizing Provider  albuterol (VENTOLIN HFA) 108 (90 Base) MCG/ACT inhaler Inhale 2 puffs into  the lungs every 6 (six) hours as needed for wheezing or shortness of breath. 03/18/22  Yes Brynlyn Dade, Lesia Sago, MD  aluminum-magnesium hydroxide 200-200 MG/5ML suspension Take 15 mLs by mouth every 6 (six) hours as needed for indigestion.   Yes [provider]  bismuth subsalicylate (PEPTO BISMOL) 262 MG/15ML suspension Take 30 mLs by mouth every 6 (six) hours as needed for indigestion or diarrhea or loose stools.   Yes [provider]  BREZTRI AEROSPHERE 160-9-4.8 MCG/ACT AERO INHALE 2 PUFFS INTO THE LUNGS IN THE MORNING AND AT BEDTIME. 01/12/22  Yes Tiffany Calmes, Lesia Sago, MD  citalopram (CELEXA) 20 MG tablet Take 1 tablet (20 mg total) by mouth daily. 06/22/22  Yes White, Arlys John A, NP  diazepam (VALIUM) 10 MG tablet Take 1  tablet (10 mg total) by mouth 2 (two) times daily as needed for anxiety. 06/21/22  Yes Joan Flores, NP  doxycycline (VIBRA-TABS) 100 MG tablet Take 1 tablet (100 mg total) by mouth 2 (two) times daily. 06/18/22  Yes Pamila Mendibles, Lesia Sago, MD  Dupilumab (DUPIXENT) 300 MG/2ML SOPN Inject 300 mg into the skin every 14 (fourteen) days. Loading dose completed in clinic on 03/18/2022 03/18/22  Yes Devinn Hurwitz, Lesia Sago, MD  HYDROcodone-acetaminophen (NORCO/VICODIN) 5-325 MG tablet Take 1 tablet by mouth every 6 (six) hours as needed for moderate pain or severe pain. 06/12/21  Yes [provider]  predniSONE (DELTASONE) 20 MG tablet Take 2 tablets (40 mg total) by mouth daily with breakfast for 5 days, THEN 1 tablet (20 mg total) daily with breakfast for 5 days. 06/18/22 06/28/22 Yes Aleira Deiter, Lesia Sago, MD  predniSONE (DELTASONE) 10 MG tablet Take 1 tablet (10 mg total) by mouth daily with breakfast. Patient not taking: Reported on 06/23/2022 04/07/22   Juni Glaab, Lesia Sago, MD     Critical care time: n/a    Karren Burly, MD

## 2022-06-24 NOTE — Progress Notes (Addendum)
[facility] PROGRESS NOTE  Jasmin Munoz (DOB: 05-05-1950) WUJ:[id number] PCP: [facility]: Pulmonology, Dr. Judeth Horn  Brief Narrative: Jasmin Munoz is a 72 y.o. female with a history of COPD, tobacco use, depression, fibromyalgia who presented to the ED on 06/23/2022 with shortness of breath despite increasing prednisone and doxycycline as an outpatient, subsequently admitted for COPD exacerbation.  CTA chest showed no PE, unchanged 4.1cm ascending thoracic aortic aneurysm, stable 4mm RLL pulmonary nodule and new 3mm adjacent nodule, stable anterior mediastinal nodule (non-FDG avid on PET Sept 2023).   Subjective: Very short of breath with exertion, only slightly improved from admission. Wants to go home. Required 2L O2 to regain adequate saturations during/after ambulation today.  Objective: BP (!) 170/90 (BP Location: Left Arm)   Pulse 89   Temp 98 F (36.7 C) (Oral)   Resp 20   Ht 5\' 1"  (1.549 m)   Wt 51.1 kg   SpO2 97%   BMI 21.29 kg/m   Gen: Frail female in no distress Pulm: Diminished without crackles or wheezes, nonlabored at rest on 1.5L  CV: RRR, no MRG or pitting edema GI: Soft, NT, ND, +BS Neuro: Alert and oriented. No new focal deficits. Ext: Warm, no deformities. Skin: No rashes, lesions or ulcers on visualized skin   Assessment & Plan: Acute hypoxic respiratory failure presumably due to COPD exacerbation: Exertional hypoxemia confirmed today along with significant dyspnea. RVP negative, covid negative, no clinical evidence of heart failure, effusion, etc. - Continue 2L O2 and/or titrate to maintain SpO2 >88% and normal respiratory effort.  - Continue IV steroids, scheduled and prn nebulized therapies.  - No antibiotics since no increased sputum and no infiltrate on CXR, though defer to pulm. - Appreciate pulmonology consultation.  - Palliative care involvement may be appropriate.   Depression: Continue  SSRI  Lymphocytosis: Unclear etiology, though seems to have been present on blood work earlier this year. Atypical morphology on smear.  - Recommend formal hematology consultation after discharge (depending on goals of care going forward).   Thoracic aortic aneurysm and Pulmonary nodules:  - Suggest appropriate surveillance imaging in 12 months depending on goals of care.   Tyrone Nine, MD [facility] [email or web address] 06/24/2022, 1:37 PM

## 2022-06-25 ENCOUNTER — Inpatient Hospital Stay (HOSPITAL_COMMUNITY): Payer: Medicare HMO

## 2022-06-25 DIAGNOSIS — R0609 Other forms of dyspnea: Secondary | ICD-10-CM | POA: Diagnosis not present

## 2022-06-25 DIAGNOSIS — J441 Chronic obstructive pulmonary disease with (acute) exacerbation: Secondary | ICD-10-CM | POA: Diagnosis not present

## 2022-06-25 LAB — PATHOLOGIST SMEAR REVIEW

## 2022-06-25 LAB — ECHOCARDIOGRAM COMPLETE
AR max vel: 2.07 cm2
AV Area VTI: 2.05 cm2
AV Area mean vel: 1.83 cm2
AV Mean grad: 3 mmHg
AV Peak grad: 6.4 mmHg
Ao pk vel: 1.26 m/s
Area-P 1/2: 3.53 cm2
Height: 61 in
S' Lateral: 2.5 cm
Single Plane A4C EF: 68 %
Weight: 1802.48 oz

## 2022-06-25 MED ORDER — PREDNISONE 20 MG PO TABS: ORAL_TABLET | ORAL | 0 refills | Status: DC

## 2022-06-25 MED ORDER — PREDNISONE 10 MG PO TABS: 10.0000 mg | ORAL_TABLET | Freq: Every day | ORAL | 0 refills | Status: DC

## 2022-06-25 MED ORDER — HYDRALAZINE HCL 20 MG/ML IJ SOLN: 5.0000 mg | INTRAMUSCULAR | Status: DC | PRN

## 2022-06-25 NOTE — Plan of Care (Signed)

## 2022-06-25 NOTE — Progress Notes (Addendum)
SATURATION QUALIFICATIONS: (This note is used to comply with regulatory documentation for home oxygen)  Patient Saturations on Room Air at Rest = 93%  Patient Saturations on Room Air while Ambulating = 88%  Patient Saturations on 2 Liters of oxygen while Ambulating = 95%  Please briefly explain why patient needs home oxygen: When pt ambulating long distances, pt drops to mid to low 80's

## 2022-06-25 NOTE — Progress Notes (Signed)
NAME:  Jasmin Munoz, MRN:  [id number], DOB:  [birth year], LOS: 1 ADMISSION DATE:  06/23/2022, CONSULTATION DATE:  06/25/22 REFERRING MD:  TRH, CHIEF COMPLAINT:  DOE   History of Present Illness:  72 y.o. woman followed in pulmonary clinic for asthma and COPD overlap with history of recurrent exacerbations,, steroid-dependent, recently some improvement with initiation of biologic therapy, presents with concern for exacerbation of underlying lung disease now hospitalized given lack of improvement with outpatient management.  Contacted pulmonary office last week.  Worsening shortness of breath.  Called in prednisone taper as well as course of doxycycline.  Despite taking this for few days she was no better.  She presented to the ED.  Chest x-ray unchanged.  Given relative lack of cough concern for PE contributing symptoms of shortness of breath.  CTA PE protocol was performed and showed, no acute changes, pneumonia etc. in the chest on my review interpretation.  She had a first dose of steroids this morning.  Has not had a lot of breathing treatments in the interim either.  Pertinent  Medical History  COPD, asthma  Significant Hospital Events: Including procedures, antibiotic start and stop dates in addition to other pertinent events   5/22 admitted with COPD exacerbation with failure to respond to outpatient treatment, CTA negative for PE or other infiltrate  Interim History / Subjective:  No acute overnight.  She feels some better this morning.  Seems to be moving a bit more air on exam.  Objective   Blood pressure (!) 175/105, pulse 86, temperature 97.9 F (36.6 C), resp. rate 18, height 5\' 1"  (1.549 m), weight 51.1 kg, SpO2 99 %.        Intake/Output Summary (Last 24 hours) at 06/25/2022 1050 Last data filed at 06/25/2022 4098 Gross per 24 hour  Intake 480 ml  Output --  Net 480 ml    Filed Weights   06/23/22 2112  Weight: 51.1 kg    Examination: General: Frail,  chronically ill-appearing, sitting up in bed Neck: Supple, no JVP Eyes: EOMI, icterus Lungs: Clear, distant air movement a bit better compared to day prior Cardiovascular: Warm, no edema Abdomen: Nondistended, bowel sounds present MSK: No synovitis, no joint effusions Neuro: No deficits, sensation intact Psych: Normal mood, full affect  Resolved Hospital Problem list     Assessment & Plan:  Acute exacerbation of underlying obstructive lung disease, asthma/COPD overlap on biologic therapy, steroid-dependent: Not responded to outpatient prednisone and antibiotic therapy.  Chest images clear.  No PE on CTA PE protocol.  Admit improved with initial IV steroid therapy. -- Continue IV steroids, on discharge taper prednisone 40 mg for 5 days then 20 mg for 5 days then return to home 10 mg daily -- Yupelri, arformoterol, budesonide ordered, DuoNebs changed to as needed -- Resume home inhaler and rescue inhaler use on discharge  DOE: Suspect related to the above. TTE for further evaluation given lack of response to initial treatment.   Best Practice (right click and "Reselect all SmartList Selections" daily)   Per primary  Labs   CBC: Recent Labs  Lab 06/23/22 1331 06/24/22 0104  WBC 18.1* 14.7*  NEUTROABS 9.2*  --   HGB 13.5 11.7*  HCT 44.8 38.0  MCV 99.3 98.7  PLT 286 229     Basic Metabolic Panel: Recent Labs  Lab 06/23/22 1331 06/23/22 1858 06/24/22 0104  NA 142  --  140  K 3.4*  --  3.3*  CL 103  --  105  CO2 28  --  24  GLUCOSE 114*  --  121*  BUN 19  --  20  CREATININE 0.96  --  1.01*  CALCIUM 8.9  --  8.0*  MG 3.4* 2.8* 2.7*    GFR: Estimated Creatinine Clearance: 38.6 mL/min (A) (by C-G formula based on SCr of 1.01 mg/dL (H)). Recent Labs  Lab 06/23/22 1331 06/24/22 0104  WBC 18.1* 14.7*     Liver Function Tests: Recent Labs  Lab 06/23/22 1331  AST 26  ALT 22  ALKPHOS 53  BILITOT 0.6  PROT 6.9  ALBUMIN 3.8    No results for input(s):  "LIPASE", "AMYLASE" in the last 168 hours. No results for input(s): "AMMONIA" in the last 168 hours.  ABG    Component Value Date/Time   PHART 7.39 06/23/2022 1850   PCO2ART 39 06/23/2022 1850   PO2ART 84 06/23/2022 1850   HCO3 23.6 06/23/2022 1850   ACIDBASEDEF 1.2 06/23/2022 1850   O2SAT 99.3 06/23/2022 1850     Coagulation Profile: Recent Labs  Lab 06/24/22 0104  INR 1.0     Cardiac Enzymes: No results for input(s): "CKTOTAL", "CKMB", "CKMBINDEX", "TROPONINI" in the last 168 hours.  HbA1C: Hemoglobin A1C  Date/Time Value Ref Range Status  10/17/2019 04:09 PM 5.3 4.0 - 5.6 % Final   Hgb A1c MFr Bld  Date/Time Value Ref Range Status  09/14/2017 06:29 AM 5.0 4.8 - 5.6 % Final    Comment:    (NOTE) Pre diabetes:          5.7%-6.4% Diabetes:              >6.4% Glycemic control for   <7.0% adults with diabetes     CBG: No results for input(s): "GLUCAP" in the last 168 hours.  Review of Systems:   No chest pain with exertion.  No orthopnea or PND.  Comprehensive review of systems otherwise negative.  Past Medical History:  She,  has a past medical history of Anxiety, Arthritis, Cataract, COPD (chronic obstructive pulmonary disease) (HCC), Depression, Fibromyalgia, Macular degeneration, and Seizures (HCC).   Surgical History:   Past Surgical History:  Procedure Laterality Date   ABDOMINAL HYSTERECTOMY       Social History:   reports that she quit smoking about 3 months ago. Her smoking use included cigarettes. She has a 35.00 pack-year smoking history. She uses smokeless tobacco. She reports that she does not drink alcohol and does not use drugs.   Family History:  Her family history includes Alcohol abuse in her sister; Anxiety disorder in her sister; Arthritis in her father and sister; Bipolar disorder in her sister; Depression in her sister; Drug abuse in her sister; Emphysema in her mother; Joint hypermobility in her brother.   Allergies Allergies   Allergen Reactions   Sulfa Antibiotics Hives     Home Medications  Prior to Admission medications   Medication Sig Start Date End Date Taking? Authorizing Provider  albuterol (VENTOLIN HFA) 108 (90 Base) MCG/ACT inhaler Inhale 2 puffs into the lungs every 6 (six) hours as needed for wheezing or shortness of breath. 03/18/22  Yes Trenee Igoe, Lesia Sago, MD  aluminum-magnesium hydroxide 200-200 MG/5ML suspension Take 15 mLs by mouth every 6 (six) hours as needed for indigestion.   Yes [provider]  bismuth subsalicylate (PEPTO BISMOL) 262 MG/15ML suspension Take 30 mLs by mouth every 6 (six) hours as needed for indigestion or diarrhea or loose stools.   Yes [provider]  BREZTRI AEROSPHERE 160-9-4.8 MCG/ACT AERO INHALE 2 PUFFS INTO THE LUNGS IN THE MORNING AND AT BEDTIME. 01/12/22  Yes Ritchard Paragas, Lesia Sago, MD  citalopram (CELEXA) 20 MG tablet Take 1 tablet (20 mg total) by mouth daily. 06/22/22  Yes White, Arlys John A, NP  diazepam (VALIUM) 10 MG tablet Take 1 tablet (10 mg total) by mouth 2 (two) times daily as needed for anxiety. 06/21/22  Yes Joan Flores, NP  doxycycline (VIBRA-TABS) 100 MG tablet Take 1 tablet (100 mg total) by mouth 2 (two) times daily. 06/18/22  Yes Lynden Carrithers, Lesia Sago, MD  Dupilumab (DUPIXENT) 300 MG/2ML SOPN Inject 300 mg into the skin every 14 (fourteen) days. Loading dose completed in clinic on 03/18/2022 03/18/22  Yes Cheick Suhr, Lesia Sago, MD  HYDROcodone-acetaminophen (NORCO/VICODIN) 5-325 MG tablet Take 1 tablet by mouth every 6 (six) hours as needed for moderate pain or severe pain. 06/12/21  Yes [provider]  predniSONE (DELTASONE) 20 MG tablet Take 2 tablets (40 mg total) by mouth daily with breakfast for 5 days, THEN 1 tablet (20 mg total) daily with breakfast for 5 days. 06/18/22 06/28/22 Yes Margareta Laureano, Lesia Sago, MD  predniSONE (DELTASONE) 10 MG tablet Take 1 tablet (10 mg total) by mouth daily with breakfast. Patient not taking:  Reported on 06/23/2022 04/07/22   Julie-Ann Vanmaanen, Lesia Sago, MD     Critical care time: n/a    Karren Burly, MD

## 2022-06-25 NOTE — TOC Initial Note (Addendum)
Transition of Care [facility]) - Initial/Assessment Note    Patient Details  Name: Jasmin Munoz MRN: [id number] Date of Birth: 04-Mar-1950  Transition of Care [facility]) CM/SW Contact:    Adrian Prows, RN Phone Number: 06/25/2022, 11:57 AM  Clinical Narrative:                 [facility] consulted for home oxygen; spoke w/ pt in room; she agrees to service and does not have an agency preference; pt says she is from home and plans to return at d/c; she identified POC  Elonda Husky (dtr) [phone]; pt says she has transportation; she denies IPV, food insecurity, and difficulty paying for housing and utilities; pt says she has glasses; she does not have HA, dentures, DME, HH services, and home oxygen; pt verified d/c address [address] [place], [address]; contacted Barbara Cower at [employer] and he says agency can provide service; confirmed w/ Barbara Cower travel tank will be delivered to room; pt given name of agency; agency contact information also placed follow-up provider section of d/c instructions; no TOC needs.  Expected Discharge Plan: Home/Self Care Barriers to Discharge: No Barriers Identified   Patient Goals and CMS Choice Patient states their goals for this hospitalization and ongoing recovery are:: home          Expected Discharge Plan and Services   Discharge Planning Services: CM Consult Post Acute Care Choice: Durable Medical Equipment (home oxygen) Living arrangements for the past 2 months: Single Family Home Expected Discharge Date: 06/25/22               DME Arranged: Oxygen DME Agency: [employer] Date DME Agency Contacted: 06/25/22                Prior Living Arrangements/Services Living arrangements for the past 2 months: Single Family Home Lives with:: Self Patient language and need for interpreter reviewed:: Yes Do you feel safe going back to the place where you live?: Yes      Need for Family Participation in Patient Care: Yes (Comment) Care giver support system in  place?: Yes (comment) Current home services:  (n/a) Criminal Activity/Legal Involvement Pertinent to Current Situation/Hospitalization: No - Comment as needed  Activities of Daily Living Home Assistive Devices/Equipment: None ADL Screening (condition at time of admission) Patient's cognitive ability adequate to safely complete daily activities?: Yes Is the patient deaf or have difficulty hearing?: Yes Does the patient have difficulty seeing, even when wearing glasses/contacts?: No Does the patient have difficulty concentrating, remembering, or making decisions?: No Patient able to express need for assistance with ADLs?: Yes Does the patient have difficulty dressing or bathing?: No Independently performs ADLs?: Yes (appropriate for developmental age) Does the patient have difficulty walking or climbing stairs?: Yes Weakness of Legs: Both Weakness of Arms/Hands: None  Permission Sought/Granted Permission sought to share information with : Case Manager Permission granted to share information with : Yes, Verbal Permission Granted  Share Information with NAME: Burnard Bunting, RN, CM     Permission granted to share info w Relationship: Elonda Husky (dtr) [phone]     Emotional Assessment Appearance:: Appears stated age Attitude/Demeanor/Rapport: Gracious Affect (typically observed): Accepting Orientation: : Oriented to Self, Oriented to Place, Oriented to  Time, Oriented to Situation Alcohol / Substance Use: Not Applicable Psych Involvement: No (comment)  Admission diagnosis:  COPD exacerbation (HCC) [J44.1] COPD with acute exacerbation (HCC) [J44.1] Acute respiratory failure with hypoxia (HCC) [J96.01] Patient Active Problem List   Diagnosis Date Noted  Acute respiratory failure with hypoxia (HCC) 06/24/2022   COPD exacerbation (HCC) 06/23/2022   Leukocytosis 06/23/2022   Hypermagnesemia 06/23/2022   Chest tightness 03/16/2022   MDD (major depressive disorder), recurrent  episode, severe (HCC) 09/13/2017   COPD (chronic obstructive pulmonary disease) (HCC) 02/03/2013   COPD with acute exacerbation (HCC) 02/03/2013   Hypoxemia 02/03/2013   Generalized anxiety disorder 02/03/2013   HTN (hypertension) 02/03/2013   Nonspecific abnormal electrocardiogram (ECG) (EKG) 02/03/2013   DEPRESSION 05/24/2006   TENDINITIS 05/24/2006   PCP:  [facility]:   [id number] [place], [place] - [address] [place] [address] Phone: [phone] Fax: [phone]  [facility] - [place], [place] - [address] [place] [address] Phone: [phone] Fax: [phone]  [facility] - [place], [place] - [address] [place] [address] Phone: [phone] Fax: [phone]  [id number] [place] [facility] - [place] [address] [place] [address] Phone: [phone] Fax: [phone]  [facility] - [address] [place] [address] Phone: [phone] Fax: [phone]     Social Determinants of Health (SDOH) Social History: SDOH Screenings   Food Insecurity: No Food Insecurity (06/25/2022)  Housing: Patient Declined (06/25/2022)  Transportation Needs: No Transportation Needs (06/25/2022)  Utilities: Not At Risk (06/25/2022)  Alcohol Screen: Low Risk  (09/13/2017)  Depression (PHQ2-9): Low Risk  (10/31/2019)  Tobacco Use: High Risk (06/23/2022)   SDOH Interventions: Food Insecurity Interventions: Inpatient TOC Housing Interventions: Inpatient TOC Transportation Interventions: Inpatient TOC Utilities Interventions: Inpatient TOC   Readmission Risk Interventions     No data to display

## 2022-06-25 NOTE — Progress Notes (Signed)
  Echocardiogram 2D Echocardiogram has been performed.  Tadashi Burkel Wynn Banker 06/25/2022, 11:46 AM

## 2022-06-25 NOTE — Discharge Summary (Signed)
Physician Discharge Summary   Patient: Jasmin Munoz MRN: [id number] DOB: March 26, 1950  Admit date:     06/23/2022  Discharge date: 06/25/22  Discharge Physician: Tyrone Nine   PCP: [facility], [facility]   Recommendations at discharge:  Follow up with pulmonology after discharge. Continue prednisone taper back to 10mg  daily dose over next 10 days. Discharged on 2L O2 (new). Consider palliative re-referral.  Consider work up with hematology for lymphocytosis if persistent at follow up. Suggest appropriate surveillance imaging for incidental/stable findings on CT (see details below).  Discharge Diagnoses: Principal Problem:   COPD with acute exacerbation (HCC) Active Problems:   COPD exacerbation (HCC)   Leukocytosis   Hypermagnesemia   Acute respiratory failure with hypoxia [facility])  Hospital Course: Jasmin Munoz is a 72 y.o. female with a history of COPD, tobacco use, depression, fibromyalgia who presented to the ED on 06/23/2022 with shortness of breath despite increasing prednisone and doxycycline as an outpatient, subsequently admitted for COPD exacerbation.   CTA chest showed no PE, unchanged 4.1cm ascending thoracic aortic aneurysm, stable 4mm RLL pulmonary nodule and new 3mm adjacent nodule, stable anterior mediastinal nodule (non-FDG avid on PET Sept 2023).   Assessment and Plan: Acute hypoxic respiratory failure presumably due to COPD exacerbation: Exertional hypoxemia confirmed today along with significant dyspnea. RVP negative, covid negative, no clinical evidence of heart failure, effusion, etc. - Echocardiogram unremarkable, normal LV and RV and estimated PASP. - Continue 2L O2 (qualifies for supplemental oxygen on morning of discharge, but improved and stable for discharge at this time) - Continue augmented steroid dosing. Wheezing is resolved and high dose steroids may be worsening anxiety, as is inpatient management, so will discharge with oral prednisone burst as  recommended by pulmonology. Continue inhaled bronchodilators.  - No antibiotics since no increased sputum and no infiltrate on CXR - Appreciate pulmonology consultation, evaluated by Dr. Judeth Horn    Depression: Continue SSRI   Lymphocytosis: Unclear etiology, though seems to have been present on blood work earlier this year. Atypical morphology on smear.  - Recommend formal hematology consultation after discharge (depending on goals of care going forward).    Thoracic aortic aneurysm and Pulmonary nodules:  - Suggest appropriate surveillance imaging in 12 months depending on goals of care.   Consultants: Pulmonology Procedures performed: Echocardiogram  Disposition: Home Diet recommendation:  Discharge Diet Orders (From admission, onward)     Start     Ordered   06/25/22 0000  Diet - low sodium heart healthy        06/25/22 1010          DISCHARGE MEDICATION: Allergies as of 06/25/2022       Reactions   Sulfa Antibiotics Hives        Medication List     TAKE these medications    albuterol 108 (90 Base) MCG/ACT inhaler Commonly known as: VENTOLIN HFA Inhale 2 puffs into the lungs every 6 (six) hours as needed for wheezing or shortness of breath.   aluminum-magnesium hydroxide 200-200 MG/5ML suspension Take 15 mLs by mouth every 6 (six) hours as needed for indigestion.   bismuth subsalicylate 262 MG/15ML suspension Commonly known as: PEPTO BISMOL Take 30 mLs by mouth every 6 (six) hours as needed for indigestion or diarrhea or loose stools.   Breztri Aerosphere 160-9-4.8 MCG/ACT Aero Generic drug: Budeson-Glycopyrrol-Formoterol INHALE 2 PUFFS INTO THE LUNGS IN THE MORNING AND AT BEDTIME.   citalopram 20 MG tablet Commonly known as: CELEXA Take 1 tablet (20  mg total) by mouth daily.   diazepam 10 MG tablet Commonly known as: VALIUM Take 1 tablet (10 mg total) by mouth 2 (two) times daily as needed for anxiety.   doxycycline 100 MG tablet Commonly known as:  VIBRA-TABS Take 1 tablet (100 mg total) by mouth 2 (two) times daily.   Dupixent 300 MG/2ML Sopn Generic drug: Dupilumab Inject 300 mg into the skin every 14 (fourteen) days. Loading dose completed in clinic on 03/18/2022   HYDROcodone-acetaminophen 5-325 MG tablet Commonly known as: NORCO/VICODIN Take 1 tablet by mouth every 6 (six) hours as needed for moderate pain or severe pain.   predniSONE 20 MG tablet Commonly known as: DELTASONE Take 2 tablets (40 mg total) by mouth daily with breakfast for 5 days, THEN 1 tablet (20 mg total) daily with breakfast for 5 days. Start taking on: Jun 25, 2022 What changed: See the new instructions.   predniSONE 10 MG tablet Commonly known as: DELTASONE Take 1 tablet (10 mg total) by mouth daily with breakfast. restart chronic daily dose after prednisone taper What changed: additional instructions               Durable Medical Equipment  (From admission, onward)           Start     Ordered   06/25/22 0957  For home use only DME oxygen  Once       Question Answer Comment  Length of Need 6 Months   Mode or (Route) Nasal cannula   Liters per Minute 2   Frequency Continuous (stationary and portable oxygen unit needed)   Oxygen delivery system Gas      06/25/22 0956            Follow-up Information     [facility]. Schedule an appointment as soon as possible for a visit.   Contact information: [address] [place] [address] [phone]         Hunsucker, Lesia Sago, MD Follow up.   Specialty: Pulmonary Disease Contact information: [address] [place] [address] [phone]         [facility] Follow up.   Contact information: [address] [place] [address] [phone]                Discharge Exam: Filed Weights   06/23/22 2112  Weight: 51.1 kg  BP (!) 167/104 (BP Location: Right Arm)   Pulse 95   Temp 97.9 F (36.6 C) (Oral)    Resp 18   Ht 5\' 1"  (1.549 m)   Wt 51.1 kg   SpO2 93%   BMI 21.29 kg/m   Thin frail female in no acute distress Diminished but improved aeration from yesterday. No wheezes or crackles. RRR, no MRG  Condition at discharge: stable  The results of significant diagnostics from this hospitalization (including imaging, microbiology, ancillary and laboratory) are listed below for reference.   Imaging Studies: ECHOCARDIOGRAM COMPLETE  Result Date: 06/25/2022    ECHOCARDIOGRAM REPORT   Patient Name:   AKEELA THALMAN Date of Exam: 06/25/2022 Medical Rec #:  [id number]      Height:       61.0 in Accession #:    [id number]     Weight:       112.7 lb Date of Birth:  [birth year]      BSA:          1.480 m Patient Age:    84 years  BP:           175/105 mmHg Patient Gender: F              HR:           88 bpm. Exam Location:  Inpatient Procedure: 2D Echo, Color Doppler and Cardiac Doppler Indications:    Dyspnea  History:        Patient has no prior history of Echocardiogram examinations.                 COPD, Signs/Symptoms:Dyspnea; Risk Factors:Hypertension.  Sonographer:    Lucy Antigua Referring Phys: [phone] MATTHEW R HUNSUCKER  Sonographer Comments: Suboptimal parasternal window and suboptimal subcostal window. Image acquisition challenging due to COPD. IMPRESSIONS  1. Left ventricular ejection fraction, by estimation, is 60 to 65%. The left ventricle has hyperdynamic function. The left ventricle has no regional wall motion abnormalities. Left ventricular diastolic parameters were normal.  2. Right ventricular systolic function is normal. The right ventricular size is normal. There is normal pulmonary artery systolic pressure.  3. The mitral valve is normal in structure. No evidence of mitral valve regurgitation. No evidence of mitral stenosis.  4. The aortic valve is normal in structure. Aortic valve regurgitation is not visualized. No aortic stenosis is present.  5. The inferior vena cava is normal in size  with greater than 50% respiratory variability, suggesting right atrial pressure of 3 mmHg. FINDINGS  Left Ventricle: Left ventricular ejection fraction, by estimation, is 60 to 65%. The left ventricle has hyperdynamic function. The left ventricle has no regional wall motion abnormalities. The left ventricular internal cavity size was normal in size. There is no left ventricular hypertrophy. Left ventricular diastolic parameters were normal. Right Ventricle: The right ventricular size is normal. No increase in right ventricular wall thickness. Right ventricular systolic function is normal. There is normal pulmonary artery systolic pressure. The tricuspid regurgitant velocity is 1.89 m/s, and  with an assumed right atrial pressure of 3 mmHg, the estimated right ventricular systolic pressure is 17.3 mmHg. Left Atrium: Left atrial size was normal in size. Right Atrium: Right atrial size was normal in size. Pericardium: There is no evidence of pericardial effusion. Mitral Valve: The mitral valve is normal in structure. No evidence of mitral valve regurgitation. No evidence of mitral valve stenosis. Tricuspid Valve: The tricuspid valve is normal in structure. Tricuspid valve regurgitation is not demonstrated. No evidence of tricuspid stenosis. Aortic Valve: The aortic valve is normal in structure. Aortic valve regurgitation is not visualized. No aortic stenosis is present. Aortic valve mean gradient measures 3.0 mmHg. Aortic valve peak gradient measures 6.4 mmHg. Aortic valve area, by VTI measures 2.05 cm. Pulmonic Valve: The pulmonic valve was normal in structure. Pulmonic valve regurgitation is not visualized. No evidence of pulmonic stenosis. Aorta: The aortic root is normal in size and structure. Venous: The inferior vena cava is normal in size with greater than 50% respiratory variability, suggesting right atrial pressure of 3 mmHg. IAS/Shunts: No atrial level shunt detected by color flow Doppler.  LEFT VENTRICLE  PLAX 2D LVIDd:         4.00 cm     Diastology LVIDs:         2.50 cm     LV e' medial:    3.37 cm/s LV PW:         1.00 cm     LV E/e' medial:  21.5 LV IVS:        0.70 cm  LV e' lateral:   8.05 cm/s LVOT diam:     2.00 cm     LV E/e' lateral: 9.0 LV SV:         57 LV SV Index:   38 LVOT Area:     3.14 cm  LV Volumes (MOD) LV vol d, MOD A4C: 42.5 ml LV vol s, MOD A4C: 13.6 ml LV SV MOD A4C:     42.5 ml RIGHT VENTRICLE RV S prime:     16.00 cm/s TAPSE (M-mode): 2.8 cm LEFT ATRIUM           Index        RIGHT ATRIUM           Index LA Vol (A4C): 18.2 ml 12.30 ml/m  RA Area:     10.80 cm                                    RA Volume:   25.20 ml  17.03 ml/m  AORTIC VALVE AV Area (Vmax):    2.07 cm AV Area (Vmean):   1.83 cm AV Area (VTI):     2.05 cm AV Vmax:           126.00 cm/s AV Vmean:          86.000 cm/s AV VTI:            0.277 m AV Peak Grad:      6.4 mmHg AV Mean Grad:      3.0 mmHg LVOT Vmax:         82.90 cm/s LVOT Vmean:        50.200 cm/s LVOT VTI:          0.181 m LVOT/AV VTI ratio: 0.65  AORTA Ao Root diam: 2.80 cm Ao Asc diam:  3.60 cm MITRAL VALVE               TRICUSPID VALVE MV Area (PHT): 3.53 cm    TR Peak grad:   14.3 mmHg MV Decel Time: 215 msec    TR Vmax:        189.00 cm/s MV E velocity: 72.40 cm/s MV A velocity: 96.40 cm/s  SHUNTS MV E/A ratio:  0.75        Systemic VTI:  0.18 m                            Systemic Diam: 2.00 cm Aditya Sabharwal Electronically signed by Dorthula Nettles Signature Date/Time: 06/25/2022/1:23:33 PM    Final    CT Angio Chest Pulmonary Embolism (PE) W or WO Contrast  Result Date: 06/23/2022 CLINICAL DATA:  Shortness of breath for 1 week.  PE suspected. EXAM: CT ANGIOGRAPHY CHEST WITH CONTRAST TECHNIQUE: Multidetector CT imaging of the chest was performed using the standard protocol during bolus administration of intravenous contrast. Multiplanar CT image reconstructions and MIPs were obtained to evaluate the vascular anatomy. RADIATION DOSE REDUCTION:  This exam was performed according to the departmental dose-optimization program which includes automated exposure control, adjustment of the mA and/or kV according to patient size and/or use of iterative reconstruction technique. CONTRAST:  80mL OMNIPAQUE IOHEXOL 350 MG/ML SOLN COMPARISON:  Chest radiograph 06/23/2022; PET/CT 06/15/2021; CT chest 10/02/2021 FINDINGS: Cardiovascular: Normal heart size. Coronary artery and aortic atherosclerotic calcification. Satisfactory opacification of the pulmonary arteries to the segmental level. No pulmonary embolism. Unchanged dilation the  ascending aorta measuring 41 mm in maximum diameter. Mediastinum/Nodes: Unchanged anterior mediastinal nodule measuring 2.3 cm in greatest dimension (series 10/image 189). This was not FDG avid on PET/CT 10/16/2021. Unremarkable esophagus and trachea. No thoracic adenopathy. Lungs/Pleura: Centrilobular emphysema greatest in the upper lobes. Mild bronchial wall thickening and mucous plugging in the lower lobes. No focal consolidation, pleural effusion, or pneumothorax. Unchanged 4 mm nodule in the right lower lobe (12/87). There is a new 3 mm nodule adjacent to this nodule (12/87). Upper Abdomen: Nonobstructing bilateral nephrolithiasis. No acute abnormality. Musculoskeletal: Remote left rib fracture.  No acute fractures. Review of the MIP images confirms the above findings. IMPRESSION: 1. No evidence of pulmonary embolism. 2. 4.1 cm ascending thoracic aortic aneurysm, unchanged. Recommend annual imaging followup by CTA or MRA. This recommendation follows 2010 ACCF/AHA/AATS/ACR/ASA/SCA/SCAI/SIR/STS/SVM Guidelines for the Diagnosis and Management of Patients with Thoracic Aortic Disease. Circulation. 2010; 121: Z610-R604. Aortic aneurysm NOS (ICD10-I71.9) 3. Stable 4 mm nodule in the right lower lobe compared with 10/02/2021. There is a new adjacent 3 mm pulmonary nodule. Non-contrast chest CT can be considered in 12 months in this high-risk  patient. This recommendation follows the consensus statement: Guidelines for Management of Incidental Pulmonary Nodules Detected on CT Images: From the Fleischner Society 2017; Radiology 2017; 284:228-243. 4. Stable circumscribed nodule in the anterior mediastinum which was not FDG avid on PET/CT 10/16/2021 and is likely benign. Aortic Atherosclerosis (ICD10-I70.0) and Emphysema (ICD10-J43.9). Electronically Signed   By: Minerva Fester M.D.   On: 06/23/2022 19:03   DG Chest 2 View  Result Date: 06/23/2022 CLINICAL DATA:  Shortness of breath.  History of emphysema. EXAM: CHEST - 2 VIEW COMPARISON:  Chest radiographs 03/22/2022 and 09/09/2021; CT chest 10/02/2021 FINDINGS: Cardiac silhouette and mediastinal contours are within normal limits. Mild calcification within aortic arch. There is flattening of the diaphragms and moderate hyperinflation, unchanged. There increased lucencies within the bilateral lungs, especially superiorly, consistent with the moderate chronic emphysematous changes seen on prior CT. No acute airspace opacity. Right-greater-than-left apical scarring, similar to prior radiographs and CT. No pleural effusion pneumothorax. Moderate multilevel degenerative disc changes of the thoracic spine. IMPRESSION: 1. No acute lung process. 2. Moderate chronic emphysematous changes, similar to prior CT. Electronically Signed   By: Neita Garnet M.D.   On: 06/23/2022 14:10    Microbiology: Results for orders placed or performed during the hospital encounter of 06/23/22  SARS Coronavirus 2 by RT PCR (hospital order, performed in [facility] hospital lab) *cepheid single result test* Anterior Nasal Swab     Status: None   Collection Time: 06/23/22  6:58 PM   Specimen: Anterior Nasal Swab  Result Value Ref Range Status   SARS Coronavirus 2 by RT PCR NEGATIVE NEGATIVE Final    Comment: (NOTE) SARS-CoV-2 target nucleic acids are NOT DETECTED.  The SARS-CoV-2 RNA is generally detectable in upper and  lower respiratory specimens during the acute phase of infection. The lowest concentration of SARS-CoV-2 viral copies this assay can detect is 250 copies / mL. A negative result does not preclude SARS-CoV-2 infection and should not be used as the sole basis for treatment or other patient management decisions.  A negative result may occur with improper specimen collection / handling, submission of specimen other than nasopharyngeal swab, presence of viral mutation(s) within the areas targeted by this assay, and inadequate number of viral copies (<250 copies / mL). A negative result must be combined with clinical observations, patient history, and epidemiological information.  Fact Sheet for  Patients:   RoadLapTop.co.za  Fact Sheet for Healthcare Providers: http://kim-miller.com/  This test is not yet approved or  cleared by the [place] FDA and has been authorized for detection and/or diagnosis of SARS-CoV-2 by FDA under an Emergency Use Authorization (EUA).  This EUA will remain in effect (meaning this test can be used) for the duration of the COVID-19 declaration under Section 564(b)(1) of the Act, 21 U.S.C. section 360bbb-3(b)(1), unless the authorization is terminated or revoked sooner.  Performed at [facility], [address] [place], [place] [address]   Respiratory (~20 pathogens) panel by PCR     Status: None   Collection Time: 06/23/22  6:58 PM   Specimen: Anterior Nasal Swab; Respiratory  Result Value Ref Range Status   Adenovirus NOT DETECTED NOT DETECTED Final   Coronavirus 229E NOT DETECTED NOT DETECTED Final    Comment: (NOTE) The Coronavirus on the Respiratory Panel, DOES NOT test for the novel  Coronavirus (2019 nCoV)    Coronavirus HKU1 NOT DETECTED NOT DETECTED Final   Coronavirus NL63 NOT DETECTED NOT DETECTED Final   Coronavirus OC43 NOT DETECTED NOT DETECTED Final   Metapneumovirus NOT  DETECTED NOT DETECTED Final   Rhinovirus / Enterovirus NOT DETECTED NOT DETECTED Final   Influenza A NOT DETECTED NOT DETECTED Final   Influenza B NOT DETECTED NOT DETECTED Final   Parainfluenza Virus 1 NOT DETECTED NOT DETECTED Final   Parainfluenza Virus 2 NOT DETECTED NOT DETECTED Final   Parainfluenza Virus 3 NOT DETECTED NOT DETECTED Final   Parainfluenza Virus 4 NOT DETECTED NOT DETECTED Final   Respiratory Syncytial Virus NOT DETECTED NOT DETECTED Final   Bordetella pertussis NOT DETECTED NOT DETECTED Final   Bordetella Parapertussis NOT DETECTED NOT DETECTED Final   Chlamydophila pneumoniae NOT DETECTED NOT DETECTED Final   Mycoplasma pneumoniae NOT DETECTED NOT DETECTED Final    Comment: Performed at [facility], [address] [place], [place] [address]    Labs: CBC: Recent Labs  Lab 06/23/22 1331 06/24/22 0104  WBC 18.1* 14.7*  NEUTROABS 9.2*  --   HGB 13.5 11.7*  HCT 44.8 38.0  MCV 99.3 98.7  PLT 286 229   Basic Metabolic Panel: Recent Labs  Lab 06/23/22 1331 06/23/22 1858 06/24/22 0104  NA 142  --  140  K 3.4*  --  3.3*  CL 103  --  105  CO2 28  --  24  GLUCOSE 114*  --  121*  BUN 19  --  20  CREATININE 0.96  --  1.01*  CALCIUM 8.9  --  8.0*  MG 3.4* 2.8* 2.7*   Liver Function Tests: Recent Labs  Lab 06/23/22 1331  AST 26  ALT 22  ALKPHOS 53  BILITOT 0.6  PROT 6.9  ALBUMIN 3.8   CBG: No results for input(s): "GLUCAP" in the last 168 hours.  Discharge time spent: greater than 30 minutes.  Signed: Tyrone Nine, MD [facility] 06/25/2022

## 2022-06-25 NOTE — Progress Notes (Signed)
Mobility Specialist - Progress Note   06/25/22 0944  Mobility  Activity Ambulated with assistance in hallway  Level of Assistance Standby assist, set-up cues, supervision of patient - no hands on  Assistive Device None  Distance Ambulated (ft) 200 ft  Range of Motion/Exercises Active  Activity Response Tolerated well  Mobility Referral Yes  $Mobility charge 1 Mobility  Mobility Specialist Start Time (ACUTE ONLY) 0932  Mobility Specialist Stop Time (ACUTE ONLY) 0941  Mobility Specialist Time Calculation (min) (ACUTE ONLY) 9 min   Pt received in bed and agreed to mobility, had some wheezing throughout session and continued throughout session.  Nurse requested Mobility Specialist to perform oxygen saturation test with pt which includes removing pt from oxygen both at rest and while ambulating.  Below are the results from that testing.     Patient Saturations on Room Air at Rest = spO2 94%  Patient Saturations on Room Air while Ambulating = sp02 88% .  Rested and performed pursed lip breathing for 1 minute with sp02 at 91%.  At end of testing pt left in room on 2L Liters of oxygen.  Reported results to nurse.   Pt returned to bed with all needs met.  Marilynne Halsted Mobility Specialist

## 2022-06-29 ENCOUNTER — Telehealth: Payer: Self-pay | Admitting: Pulmonary Disease

## 2022-06-29 NOTE — Telephone Encounter (Signed)
Called and spoke with patient. She verbalized understanding.   Will await MH's response.

## 2022-06-29 NOTE — Telephone Encounter (Signed)
Called and spoke with pt who states she is really week and still not doing well after being discharged from the hospital. States she is having problems catching her breath.  Pt said she feels now that she does need palliative care at this point as she does not feel like she can go another day living how she currently feels.  Looked at pt's hospital stay and it was mentioned in there that they did say palliative care involvement may be appropriate.  Stated to pt that I would send message for advice and that we would call her back with recommendations but did state to pt if she feels that bad especially if it is how she felt when first admitted to the hospital that it might be best for her to go back to the hospital and she verbalized understanding.  Routing to provider of the day Dr. Sherene Sires since Dr. Judeth Horn is not available until 5/30 and also routing to Dr. Judeth Horn for review too. Please advise.

## 2022-06-29 NOTE — Telephone Encounter (Signed)
Nothing to offer at this point in office > should return to ER to re-eval if feeling as bad as when went there last

## 2022-06-29 NOTE — Telephone Encounter (Signed)
PT was at hospital this weekend. Sent home and still not doing well. Struggling to breath. States may need palliative care at this point. Please call to advise. She is wondering about referral to a place that accepts her ins.  Her # is [phone]

## 2022-07-02 NOTE — Telephone Encounter (Signed)
Could try more prednisone - or if she wants we can have a hospice service evaluate if she could enroll.

## 2022-07-02 NOTE — Telephone Encounter (Signed)
Lm x1 for patient.  

## 2022-07-04 ENCOUNTER — Inpatient Hospital Stay (HOSPITAL_COMMUNITY)
Admission: EM | Admit: 2022-07-04 | Discharge: 2022-07-08 | DRG: 191 | Disposition: A | Discharge status: Hospice | Payer: Medicare HMO | Attending: Internal Medicine | Admitting: Internal Medicine

## 2022-07-04 ENCOUNTER — Other Ambulatory Visit: Payer: Self-pay

## 2022-07-04 ENCOUNTER — Encounter (HOSPITAL_COMMUNITY): Payer: Self-pay

## 2022-07-04 ENCOUNTER — Emergency Department (HOSPITAL_COMMUNITY): Payer: Medicare HMO

## 2022-07-04 DIAGNOSIS — Z825 Family history of asthma and other chronic lower respiratory diseases: Secondary | ICD-10-CM

## 2022-07-04 DIAGNOSIS — Z811 Family history of alcohol abuse and dependence: Secondary | ICD-10-CM

## 2022-07-04 DIAGNOSIS — K5900 Constipation, unspecified: Secondary | ICD-10-CM | POA: Diagnosis present

## 2022-07-04 DIAGNOSIS — E876 Hypokalemia: Secondary | ICD-10-CM | POA: Diagnosis present

## 2022-07-04 DIAGNOSIS — Z888 Allergy status to other drugs, medicaments and biological substances status: Secondary | ICD-10-CM | POA: Diagnosis not present

## 2022-07-04 DIAGNOSIS — F32A Depression, unspecified: Secondary | ICD-10-CM | POA: Diagnosis present

## 2022-07-04 DIAGNOSIS — Z813 Family history of other psychoactive substance abuse and dependence: Secondary | ICD-10-CM

## 2022-07-04 DIAGNOSIS — I10 Essential (primary) hypertension: Secondary | ICD-10-CM | POA: Diagnosis present

## 2022-07-04 DIAGNOSIS — R0602 Shortness of breath: Secondary | ICD-10-CM

## 2022-07-04 DIAGNOSIS — J441 Chronic obstructive pulmonary disease with (acute) exacerbation: Secondary | ICD-10-CM | POA: Diagnosis present

## 2022-07-04 DIAGNOSIS — R0902 Hypoxemia: Secondary | ICD-10-CM | POA: Diagnosis present

## 2022-07-04 DIAGNOSIS — J9601 Acute respiratory failure with hypoxia: Secondary | ICD-10-CM | POA: Diagnosis present

## 2022-07-04 DIAGNOSIS — Z7952 Long term (current) use of systemic steroids: Secondary | ICD-10-CM

## 2022-07-04 DIAGNOSIS — H3530 Unspecified macular degeneration: Secondary | ICD-10-CM | POA: Diagnosis present

## 2022-07-04 DIAGNOSIS — G8929 Other chronic pain: Secondary | ICD-10-CM | POA: Diagnosis present

## 2022-07-04 DIAGNOSIS — M797 Fibromyalgia: Secondary | ICD-10-CM | POA: Diagnosis present

## 2022-07-04 DIAGNOSIS — Z8261 Family history of arthritis: Secondary | ICD-10-CM

## 2022-07-04 DIAGNOSIS — J9612 Chronic respiratory failure with hypercapnia: Secondary | ICD-10-CM | POA: Diagnosis present

## 2022-07-04 DIAGNOSIS — Z66 Do not resuscitate: Secondary | ICD-10-CM | POA: Diagnosis present

## 2022-07-04 DIAGNOSIS — Z720 Tobacco use: Secondary | ICD-10-CM | POA: Diagnosis not present

## 2022-07-04 DIAGNOSIS — F419 Anxiety disorder, unspecified: Secondary | ICD-10-CM | POA: Diagnosis present

## 2022-07-04 DIAGNOSIS — Z818 Family history of other mental and behavioral disorders: Secondary | ICD-10-CM | POA: Diagnosis not present

## 2022-07-04 DIAGNOSIS — I712 Thoracic aortic aneurysm, without rupture, unspecified: Secondary | ICD-10-CM | POA: Diagnosis present

## 2022-07-04 DIAGNOSIS — Z7189 Other specified counseling: Secondary | ICD-10-CM

## 2022-07-04 DIAGNOSIS — N1831 Chronic kidney disease, stage 3a: Secondary | ICD-10-CM | POA: Diagnosis present

## 2022-07-04 DIAGNOSIS — F17210 Nicotine dependence, cigarettes, uncomplicated: Secondary | ICD-10-CM | POA: Diagnosis present

## 2022-07-04 DIAGNOSIS — Z79899 Other long term (current) drug therapy: Secondary | ICD-10-CM | POA: Diagnosis not present

## 2022-07-04 DIAGNOSIS — Z882 Allergy status to sulfonamides status: Secondary | ICD-10-CM | POA: Diagnosis not present

## 2022-07-04 DIAGNOSIS — Z515 Encounter for palliative care: Secondary | ICD-10-CM

## 2022-07-04 DIAGNOSIS — Z1152 Encounter for screening for COVID-19: Secondary | ICD-10-CM | POA: Diagnosis not present

## 2022-07-04 DIAGNOSIS — I129 Hypertensive chronic kidney disease with stage 1 through stage 4 chronic kidney disease, or unspecified chronic kidney disease: Secondary | ICD-10-CM | POA: Diagnosis present

## 2022-07-04 LAB — BASIC METABOLIC PANEL
Anion gap: 10 (ref 5–15)
BUN: 19 mg/dL (ref 8–23)
CO2: 28 mmol/L (ref 22–32)
Calcium: 8.8 mg/dL — ABNORMAL LOW (ref 8.9–10.3)
Chloride: 102 mmol/L (ref 98–111)
Creatinine, Ser: 1.14 mg/dL — ABNORMAL HIGH (ref 0.44–1.00)
GFR, Estimated: 51 mL/min — ABNORMAL LOW (ref >60)
Glucose, Bld: 119 mg/dL — ABNORMAL HIGH (ref 70–99)
Potassium: 3.1 mmol/L — ABNORMAL LOW (ref 3.5–5.1)
Sodium: 140 mmol/L (ref 135–145)

## 2022-07-04 LAB — CBC WITH DIFFERENTIAL/PLATELET
Abs Immature Granulocytes: 0.08 10*3/uL — ABNORMAL HIGH (ref 0.00–0.07)
Basophils Absolute: 0.1 10*3/uL (ref 0.0–0.1)
Basophils Relative: 0 %
Eosinophils Absolute: 0.1 10*3/uL (ref 0.0–0.5)
Eosinophils Relative: 1 %
HCT: 41.3 % (ref 36.0–46.0)
Hemoglobin: 12.9 g/dL (ref 12.0–15.0)
Immature Granulocytes: 1 %
Lymphocytes Relative: 32 %
Lymphs Abs: 5.5 10*3/uL — ABNORMAL HIGH (ref 0.7–4.0)
MCH: 30.1 pg (ref 26.0–34.0)
MCHC: 31.2 g/dL (ref 30.0–36.0)
MCV: 96.3 fL (ref 80.0–100.0)
Monocytes Absolute: 1.1 10*3/uL — ABNORMAL HIGH (ref 0.1–1.0)
Monocytes Relative: 7 %
Neutro Abs: 10.2 10*3/uL — ABNORMAL HIGH (ref 1.7–7.7)
Neutrophils Relative %: 59 %
Platelets: 281 10*3/uL (ref 150–400)
RBC: 4.29 MIL/uL (ref 3.87–5.11)
RDW: 12.5 % (ref 11.5–15.5)
WBC: 17 10*3/uL — ABNORMAL HIGH (ref 4.0–10.5)
nRBC: 0 % (ref 0.0–0.2)

## 2022-07-04 LAB — BRAIN NATRIURETIC PEPTIDE: B Natriuretic Peptide: 70 pg/mL (ref 0.0–100.0)

## 2022-07-04 LAB — BLOOD GAS, VENOUS
Acid-Base Excess: 6.2 mmol/L — ABNORMAL HIGH (ref 0.0–2.0)
Bicarbonate: 32.7 mmol/L — ABNORMAL HIGH (ref 20.0–28.0)
O2 Saturation: 66.6 %
Patient temperature: 36.1
pCO2, Ven: 52 mmHg (ref 44–60)
pH, Ven: 7.4 (ref 7.25–7.43)
pO2, Ven: 35 mmHg (ref 32–45)

## 2022-07-04 LAB — RESP PANEL BY RT-PCR (RSV, FLU A&B, COVID)  RVPGX2
Influenza A by PCR: NEGATIVE
Influenza B by PCR: NEGATIVE
Resp Syncytial Virus by PCR: NEGATIVE
SARS Coronavirus 2 by RT PCR: NEGATIVE

## 2022-07-04 MED ORDER — ALBUTEROL SULFATE (2.5 MG/3ML) 0.083% IN NEBU
10.0000 mg/h | INHALATION_SOLUTION | RESPIRATORY_TRACT | Status: AC
  Administered 2022-07-04: 10 mg/h via RESPIRATORY_TRACT
  Filled 2022-07-04: qty 12

## 2022-07-04 MED ORDER — HYDRALAZINE HCL 20 MG/ML IJ SOLN
5.0000 mg | Freq: Four times a day (QID) | INTRAMUSCULAR | Status: DC | PRN
  Filled 2022-07-04: qty 1

## 2022-07-04 MED ORDER — POTASSIUM CHLORIDE 10 MEQ/100ML IV SOLN
10.0000 meq | INTRAVENOUS | Status: AC
  Administered 2022-07-04 – 2022-07-05 (×2): 10 meq via INTRAVENOUS
  Filled 2022-07-04 (×2): qty 100

## 2022-07-04 MED ORDER — MAGNESIUM SULFATE 2 GM/50ML IV SOLN
2.0000 g | Freq: Once | INTRAVENOUS | Status: AC
  Administered 2022-07-04: 2 g via INTRAVENOUS
  Filled 2022-07-04: qty 50

## 2022-07-04 MED ORDER — NICOTINE 14 MG/24HR TD PT24
14.0000 mg | MEDICATED_PATCH | Freq: Every day | TRANSDERMAL | Status: DC
  Administered 2022-07-04 – 2022-07-08 (×5): 14 mg via TRANSDERMAL
  Filled 2022-07-04 (×5): qty 1

## 2022-07-04 MED ORDER — ENOXAPARIN SODIUM 40 MG/0.4ML IJ SOSY
40.0000 mg | PREFILLED_SYRINGE | INTRAMUSCULAR | Status: DC
  Administered 2022-07-05 – 2022-07-08 (×4): 40 mg via SUBCUTANEOUS
  Filled 2022-07-04 (×4): qty 0.4

## 2022-07-04 MED ORDER — CITALOPRAM HYDROBROMIDE 20 MG PO TABS
20.0000 mg | ORAL_TABLET | Freq: Every day | ORAL | Status: DC
  Administered 2022-07-05 – 2022-07-08 (×4): 20 mg via ORAL
  Filled 2022-07-04 (×4): qty 1

## 2022-07-04 NOTE — ED Triage Notes (Signed)
Pt BIB EMS from home for worsening SOB Pt was d/c two weeks ago for the same complaint, but it became worse. Per EMS there was absent lung sounds and minimal wheezing upon arrival.  2mg  Magnesium 125mg  solumedrol 10mg  albuterol 0.5mg  Atrovent  90% RA  160/102 Capnography 38

## 2022-07-04 NOTE — ED Notes (Signed)
ED TO INPATIENT HANDOFF REPORT  ED Nurse Name and Phone #: Linus Orn Name/Age/Gender Jasmin Munoz 72 y.o. female Room/Bed: WA13/WA13  Code Status   Code Status: Full Code  Home/SNF/Other Home Patient oriented to: self, place, time, and situation Is this baseline? Yes   Triage Complete: Triage complete  Chief Complaint COPD with acute exacerbation (HCC) [J44.1]  Triage Note Pt BIB EMS from home for worsening SOB Pt was d/c two weeks ago for the same complaint, but it became worse. Per EMS there was absent lung sounds and minimal wheezing upon arrival.  2mg  Magnesium 125mg  solumedrol 10mg  albuterol 0.5mg  Atrovent  90% RA  160/102 Capnography 38     Allergies Allergies  Allergen Reactions   Sulfa Antibiotics Hives    Level of Care/Admitting Diagnosis ED Disposition     ED Disposition  Admit   Condition  --   Comment  Hospital Area: [facility] [[id number]]  Level of Care: Progressive [102]  Admit to Progressive based on following criteria: RESPIRATORY PROBLEMS hypoxemic/hypercapnic respiratory failure that is responsive to NIPPV (BiPAP) or High Flow Nasal Cannula (6-80 lpm). Frequent assessment/intervention, no > Q2 hrs < Q4 hrs, to maintain oxygenation and pulmonary hygiene.  May admit patient to [facility] or [facility] if equivalent level of care is available:: Yes  Covid Evaluation: Asymptomatic - no recent exposure (last 10 days) testing not required  Diagnosis: COPD with acute exacerbation [facility]) [[id number]]  Admitting Physician: Darlin Drop [[id number]]  Attending Physician: Darlin Drop [[id number]]  Certification:: I certify this patient will need inpatient services for at least 2 midnights  Estimated Length of Stay: 2          B Medical/Surgery History Past Medical History:  Diagnosis Date   Anxiety    Arthritis    Cataract    COPD (chronic obstructive pulmonary disease) (HCC)    Depression    Fibromyalgia    Macular  degeneration    Seizures (HCC)    Past Surgical History:  Procedure Laterality Date   ABDOMINAL HYSTERECTOMY       A IV Location/Drains/Wounds Patient Lines/Drains/Airways Status     Active Line/Drains/Airways     Name Placement date Placement time Site Days   Peripheral IV 07/04/22 18 G Left Antecubital 07/04/22  2046  Antecubital  less than 1            Intake/Output Last 24 hours No intake or output data in the 24 hours ending 07/04/22 2212  Labs/Imaging Results for orders placed or performed during the hospital encounter of 07/04/22 (from the past 48 hour(s))  Basic metabolic panel     Status: Abnormal   Collection Time: 07/04/22  8:31 PM  Result Value Ref Range   Sodium 140 135 - 145 mmol/L   Potassium 3.1 (L) 3.5 - 5.1 mmol/L   Chloride 102 98 - 111 mmol/L   CO2 28 22 - 32 mmol/L   Glucose, Bld 119 (H) 70 - 99 mg/dL    Comment: Glucose reference range applies only to samples taken after fasting for at least 8 hours.   BUN 19 8 - 23 mg/dL   Creatinine, Ser 2.95 (H) 0.44 - 1.00 mg/dL   Calcium 8.8 (L) 8.9 - 10.3 mg/dL   GFR, Estimated 51 (L) >60 mL/min    Comment: (NOTE) Calculated using the CKD-EPI Creatinine Equation (2021)    Anion gap 10 5 - 15    Comment: Performed at [facility],  [address], [place], [place] [address]  CBC with Differential/Platelet     Status: Abnormal   Collection Time: 07/04/22  8:31 PM  Result Value Ref Range   WBC 17.0 (H) 4.0 - 10.5 K/uL   RBC 4.29 3.87 - 5.11 MIL/uL   Hemoglobin 12.9 12.0 - 15.0 g/dL   HCT 60.4 54.0 - 98.1 %   MCV 96.3 80.0 - 100.0 fL   MCH 30.1 26.0 - 34.0 pg   MCHC 31.2 30.0 - 36.0 g/dL   RDW 19.1 47.8 - 29.5 %   Platelets 281 150 - 400 K/uL   nRBC 0.0 0.0 - 0.2 %   Neutrophils Relative % 59 %   Neutro Abs 10.2 (H) 1.7 - 7.7 K/uL   Lymphocytes Relative 32 %   Lymphs Abs 5.5 (H) 0.7 - 4.0 K/uL   Monocytes Relative 7 %   Monocytes Absolute 1.1 (H) 0.1 - 1.0 K/uL   Eosinophils  Relative 1 %   Eosinophils Absolute 0.1 0.0 - 0.5 K/uL   Basophils Relative 0 %   Basophils Absolute 0.1 0.0 - 0.1 K/uL   Immature Granulocytes 1 %   Abs Immature Granulocytes 0.08 (H) 0.00 - 0.07 K/uL   Large Granular Lymphocytes PRESENT     Comment: Performed at [facility], [address], [place], [place] [address]  Brain natriuretic peptide     Status: None   Collection Time: 07/04/22  8:32 PM  Result Value Ref Range   B Natriuretic Peptide 70.0 0.0 - 100.0 pg/mL    Comment: Performed at [facility], [address] [place], [place] [address]  Blood gas, venous     Status: Abnormal   Collection Time: 07/04/22  8:38 PM  Result Value Ref Range   pH, Ven 7.4 7.25 - 7.43   pCO2, Ven 52 44 - 60 mmHg   pO2, Ven 35 32 - 45 mmHg   Bicarbonate 32.7 (H) 20.0 - 28.0 mmol/L   Acid-Base Excess 6.2 (H) 0.0 - 2.0 mmol/L   O2 Saturation 66.6 %   Patient temperature 36.1     Comment: Performed at [facility], [address], [place], [place] [address]  Resp panel by RT-PCR (RSV, Flu A&B, Covid)     Status: None   Collection Time: 07/04/22  8:44 PM  Result Value Ref Range   SARS Coronavirus 2 by RT PCR NEGATIVE NEGATIVE    Comment: (NOTE) SARS-CoV-2 target nucleic acids are NOT DETECTED.  The SARS-CoV-2 RNA is generally detectable in upper respiratory specimens during the acute phase of infection. The lowest concentration of SARS-CoV-2 viral copies this assay can detect is 138 copies/mL. A negative result does not preclude SARS-Cov-2 infection and should not be used as the sole basis for treatment or other patient management decisions. A negative result may occur with  improper specimen collection/handling, submission of specimen other than nasopharyngeal swab, presence of viral mutation(s) within the areas targeted by this assay, and inadequate number of viral copies(<138 copies/mL). A negative result must be combined  with clinical observations, patient history, and epidemiological information. The expected result is Negative.  Fact Sheet for Patients:  BloggerCourse.com  Fact Sheet for Healthcare Providers:  SeriousBroker.it  This test is no t yet approved or cleared by the [place] FDA and  has been authorized for detection and/or diagnosis of SARS-CoV-2 by FDA under an Emergency Use Authorization (EUA). This EUA will remain  in effect (meaning this test can be  used) for the duration of the COVID-19 declaration under Section 564(b)(1) of the Act, 21 U.S.C.section 360bbb-3(b)(1), unless the authorization is terminated  or revoked sooner.       Influenza A by PCR NEGATIVE NEGATIVE   Influenza B by PCR NEGATIVE NEGATIVE    Comment: (NOTE) The Xpert Xpress SARS-CoV-2/FLU/RSV plus assay is intended as an aid in the diagnosis of influenza from Nasopharyngeal swab specimens and should not be used as a sole basis for treatment. Nasal washings and aspirates are unacceptable for Xpert Xpress SARS-CoV-2/FLU/RSV testing.  Fact Sheet for Patients: BloggerCourse.com  Fact Sheet for Healthcare Providers: SeriousBroker.it  This test is not yet approved or cleared by the [place] FDA and has been authorized for detection and/or diagnosis of SARS-CoV-2 by FDA under an Emergency Use Authorization (EUA). This EUA will remain in effect (meaning this test can be used) for the duration of the COVID-19 declaration under Section 564(b)(1) of the Act, 21 U.S.C. section 360bbb-3(b)(1), unless the authorization is terminated or revoked.     Resp Syncytial Virus by PCR NEGATIVE NEGATIVE    Comment: (NOTE) Fact Sheet for Patients: BloggerCourse.com  Fact Sheet for Healthcare Providers: SeriousBroker.it  This test is not yet approved or cleared by the  [place] FDA and has been authorized for detection and/or diagnosis of SARS-CoV-2 by FDA under an Emergency Use Authorization (EUA). This EUA will remain in effect (meaning this test can be used) for the duration of the COVID-19 declaration under Section 564(b)(1) of the Act, 21 U.S.C. section 360bbb-3(b)(1), unless the authorization is terminated or revoked.  Performed at [facility], [address], [place], [place] [address]    DG Chest Port 1 View  Result Date: 07/04/2022 CLINICAL DATA:  Shortness of breath.  History of COPD. EXAM: PORTABLE CHEST 1 VIEW COMPARISON:  06/23/2022 FINDINGS: Heart size and pulmonary vascularity are normal. Emphysematous changes in the lungs. No airspace disease or consolidation. No pleural effusions. No pneumothorax. Mediastinal contours appear intact. Calcification of the aorta. No change since prior study. IMPRESSION: Emphysematous changes in the lungs. No evidence of active pulmonary disease. Electronically Signed   By: Burman Nieves M.D.   On: 07/04/2022 20:59    Pending Labs Unresulted Labs (From admission, onward)     Start     Ordered   07/11/22 0500  Creatinine, serum  (enoxaparin (LOVENOX)    CrCl >/= 30 ml/min)  Weekly,   R     Comments: while on enoxaparin therapy    07/04/22 2203            Vitals/Pain Today's Vitals   07/04/22 2034 07/04/22 2035 07/04/22 2043 07/04/22 2048  BP: (!) 174/117  (!) 174/117   Pulse: 94  89   Resp: (!) 27  20   Temp: 98.3 F (36.8 C)     TempSrc: Axillary     SpO2:  97% 99% 99%  PainSc:  0-No pain      Isolation Precautions No active isolations  Medications Medications  albuterol (PROVENTIL) (2.5 MG/3ML) 0.083% nebulizer solution (10 mg/hr Nebulization New Bag/Given 07/04/22 2048)  magnesium sulfate IVPB 2 g 50 mL (2 g Intravenous New Bag/Given 07/04/22 2157)  enoxaparin (LOVENOX) injection 40 mg (has no administration in time range)    Mobility walks     Focused  Assessments Cardiac Assessment Handoff:    Lab Results  Component Value Date   TROPONINI <0.03 08/19/2017   Lab Results  Component Value Date   DDIMER 0.44  10/17/2019   Does the Patient currently have chest pain? No    R Recommendations: See Admitting Provider Note  Report given to:   Additional Notes: Aaox4, ambulates, pt currently on bipap.

## 2022-07-04 NOTE — Progress Notes (Signed)
   07/04/22 2043  BiPAP/CPAP/SIPAP  $ Non-Invasive Ventilator  Non-Invasive Vent Initial  $ Face Mask Medium Yes  BiPAP/CPAP/SIPAP Pt Type Adult  BiPAP/CPAP/SIPAP V60  Mask Type Full face mask  Mask Size Medium  Set Rate 20 breaths/min  Respiratory Rate 20 breaths/min  IPAP 18 cmH20  EPAP 6 cmH2O  FiO2 (%) 50 %  Flow Rate 0 lpm  Minute Ventilation 13  Leak 20  Peak Inspiratory Pressure (PIP) 18  Tidal Volume (Vt) 656  Patient Home Equipment No  Auto Titrate No  Press High Alarm 35 cmH2O  Press Low Alarm 5 cmH2O  BiPAP/CPAP /SiPAP Vitals  Pulse Rate 89  Resp 20  BP (!) 174/117  SpO2 99 %  Bilateral Breath Sounds Diminished  MEWS Score/Color  MEWS Score 0  MEWS Score Color Green   Pt placed on bipap per MD order. CAT alb started. Pt is doing well and tolerating it ok at this time.

## 2022-07-04 NOTE — ED Provider Notes (Signed)
[place] EMERGENCY DEPARTMENT AT [facility] Provider Note   CSN: [id number] Arrival date & time: 07/04/22  2025     History  Chief Complaint  Patient presents with   Shortness of Breath    Jasmin Munoz is a 72 y.o. female with a history of COPD presenting by EMS concern for shortness of breath.  Discharged from hospital on 06/25/22 after admission for COPD exacerbation, with a negative CT PE study showing no acute PE, diagnosed with acute hypoxic respiratory failure presumed to be secondary to COPD, and discharged home on 2 L nasal cannula with an augmented steroid dosing at home.  Patient is noted to have a stable thoracic aortic aneurysm.  EMS gave the patient 125 mg Solu-Medrol and DuoNeb  HPI     Home Medications Prior to Admission medications   Medication Sig Start Date End Date Taking? Authorizing Provider  albuterol (VENTOLIN HFA) 108 (90 Base) MCG/ACT inhaler Inhale 2 puffs into the lungs every 6 (six) hours as needed for wheezing or shortness of breath. 03/18/22   Hunsucker, Lesia Sago, MD  aluminum-magnesium hydroxide 200-200 MG/5ML suspension Take 15 mLs by mouth every 6 (six) hours as needed for indigestion.    [provider]  bismuth subsalicylate (PEPTO BISMOL) 262 MG/15ML suspension Take 30 mLs by mouth every 6 (six) hours as needed for indigestion or diarrhea or loose stools.    [provider]  BREZTRI AEROSPHERE 160-9-4.8 MCG/ACT AERO INHALE 2 PUFFS INTO THE LUNGS IN THE MORNING AND AT BEDTIME. 01/12/22   Hunsucker, Lesia Sago, MD  citalopram (CELEXA) 20 MG tablet Take 1 tablet (20 mg total) by mouth daily. 06/22/22   Joan Flores, NP  diazepam (VALIUM) 10 MG tablet Take 1 tablet (10 mg total) by mouth 2 (two) times daily as needed for anxiety. 06/21/22   Joan Flores, NP  doxycycline (VIBRA-TABS) 100 MG tablet Take 1 tablet (100 mg total) by mouth 2 (two) times daily. 06/18/22   Hunsucker, Lesia Sago, MD  Dupilumab (DUPIXENT) 300  MG/2ML SOPN Inject 300 mg into the skin every 14 (fourteen) days. Loading dose completed in clinic on 03/18/2022 03/18/22   Hunsucker, Lesia Sago, MD  HYDROcodone-acetaminophen (NORCO/VICODIN) 5-325 MG tablet Take 1 tablet by mouth every 6 (six) hours as needed for moderate pain or severe pain. 06/12/21   [provider]  predniSONE (DELTASONE) 10 MG tablet Take 1 tablet (10 mg total) by mouth daily with breakfast. restart chronic daily dose after prednisone taper 06/25/22   Tyrone Nine, MD  predniSONE (DELTASONE) 20 MG tablet Take 2 tablets (40 mg total) by mouth daily with breakfast for 5 days, THEN 1 tablet (20 mg total) daily with breakfast for 5 days. 06/25/22 07/05/22  Tyrone Nine, MD      Allergies    Sulfa antibiotics    Review of Systems   Review of Systems  Physical Exam Updated Vital Signs BP (!) 174/117   Pulse 89   Temp 98.3 F (36.8 C) (Axillary)   Resp 20   SpO2 99%  Physical Exam Constitutional:      General: She is not in acute distress.    Comments: Thin, frail  HENT:     Head: Normocephalic and atraumatic.  Eyes:     Conjunctiva/sclera: Conjunctivae normal.     Pupils: Pupils are equal, round, and reactive to light.  Cardiovascular:     Rate and Rhythm: Normal rate and regular rhythm.  Pulmonary:  Effort: Pulmonary effort is normal. No respiratory distress.     Comments: On CPAP, faint expiratory wheezing, moving air poorly Abdominal:     General: There is no distension.     Tenderness: There is no abdominal tenderness.  Skin:    General: Skin is warm and dry.  Neurological:     General: No focal deficit present.     Mental Status: She is alert. Mental status is at baseline.  Psychiatric:        Mood and Affect: Mood normal.        Behavior: Behavior normal.     ED Results / Procedures / Treatments   Labs (all labs ordered are listed, but only abnormal results are displayed) Labs Reviewed  BASIC METABOLIC PANEL - Abnormal; Notable for the  following components:      Result Value   Potassium 3.1 (*)    Glucose, Bld 119 (*)    Creatinine, Ser 1.14 (*)    Calcium 8.8 (*)    GFR, Estimated 51 (*)    All other components within normal limits  CBC WITH DIFFERENTIAL/PLATELET - Abnormal; Notable for the following components:   WBC 17.0 (*)    Neutro Abs 10.2 (*)    Lymphs Abs 5.5 (*)    Monocytes Absolute 1.1 (*)    Abs Immature Granulocytes 0.08 (*)    All other components within normal limits  BLOOD GAS, VENOUS - Abnormal; Notable for the following components:   Bicarbonate 32.7 (*)    Acid-Base Excess 6.2 (*)    All other components within normal limits  RESP PANEL BY RT-PCR (RSV, FLU A&B, COVID)  RVPGX2  BRAIN NATRIURETIC PEPTIDE  CBC  BASIC METABOLIC PANEL  MAGNESIUM  PHOSPHORUS    EKG None  Radiology DG Chest Port 1 View  Result Date: 07/04/2022 CLINICAL DATA:  Shortness of breath.  History of COPD. EXAM: PORTABLE CHEST 1 VIEW COMPARISON:  06/23/2022 FINDINGS: Heart size and pulmonary vascularity are normal. Emphysematous changes in the lungs. No airspace disease or consolidation. No pleural effusions. No pneumothorax. Mediastinal contours appear intact. Calcification of the aorta. No change since prior study. IMPRESSION: Emphysematous changes in the lungs. No evidence of active pulmonary disease. Electronically Signed   By: Burman Nieves M.D.   On: 07/04/2022 20:59    Procedures .Critical Care  Performed by: Terald Sleeper, MD Authorized by: Terald Sleeper, MD   Critical care provider statement:    Critical care time (minutes):  35   Critical care time was exclusive of:  Separately billable procedures and treating other patients   Critical care was necessary to treat or prevent imminent or life-threatening deterioration of the following conditions:  Respiratory failure   Critical care was time spent personally by me on the following activities:  Ordering and performing treatments and interventions,  ordering and review of laboratory studies, ordering and review of radiographic studies, pulse oximetry, review of old charts, examination of patient and evaluation of patient's response to treatment   Care discussed with: admitting provider   Comments:     Bipap for COPD     Medications Ordered in ED Medications  albuterol (PROVENTIL) (2.5 MG/3ML) 0.083% nebulizer solution (10 mg/hr Nebulization New Bag/Given 07/04/22 2048)  magnesium sulfate IVPB 2 g 50 mL (2 g Intravenous New Bag/Given 07/04/22 2157)  enoxaparin (LOVENOX) injection 40 mg (has no administration in time range)  nicotine (NICODERM CQ - dosed in mg/24 hours) patch 14 mg (has no administration in time  range)  potassium chloride 10 mEq in 100 mL IVPB (has no administration in time range)    ED Course/ Medical Decision Making/ A&P Clinical Course as of 07/04/22 2248  Sun Jul 04, 2022  2201 Admitted to Dr Margo Aye hospitalist, stable on bipap on my reassessment [MT]    Clinical Course User Index [MT] Shishir Krantz, Kermit Balo, MD                             Medical Decision Making Amount and/or Complexity of Data Reviewed Labs: ordered. Radiology: ordered.  Risk Prescription drug management. Decision regarding hospitalization.   This patient presents to the ED with concern for SOB. This involves an extensive number of treatment options, and is a complaint that carries with it a high risk of complications and morbidity.  The differential diagnosis includes COPD exacerbation most likely versus pneumonia versus anemia versus other  Co-morbidities that complicate the patient evaluation: History of brittle COPD at high risk of exacerbation  Additional history obtained from EMS  External records from outside source obtained and reviewed including prior hospital discharge summary and course  I ordered and personally interpreted labs.  The pertinent results include: Chronic leukocytosis in setting of steroid use.  Mild hypokalemia in  the setting of recent albuterol use.  Venous gas unremarkable.  Respiratory viral panel negative.  I ordered imaging studies including x-ray of the chest I independently visualized and interpreted imaging which showed no emergent findings I agree with the radiologist interpretation  The patient was maintained on a cardiac monitor.  I personally viewed and interpreted the cardiac monitored which showed an underlying rhythm of: Sinus rhythm and sinus tachycardia  Per my interpretation the patient's ECG shows sinus rhythm with baseline breathing artifact  I ordered medication including magnesium and continuous nebulizers.  Patient has already received steroids 125 mg by EMS  I have reviewed the patients home medicines and have made adjustments as needed  Test Considered: Doubt acute PE in this clinical setting.  No indication for repeat CT angiogram imaging  After the interventions noted above, I reevaluated the patient and found that they have: improved   Dispostion:  After consideration of the diagnostic results and the patients response to treatment, I feel that the patent would benefit from hospitalization for COPD exacerbation         Final Clinical Impression(s) / ED Diagnoses Final diagnoses:  COPD exacerbation [facility])    Rx / DC Orders ED Discharge Orders     None         Terald Sleeper, MD 07/04/22 2248

## 2022-07-04 NOTE — Progress Notes (Signed)
Pt transferred from ED to 4E 1412 on bipap without any complications.

## 2022-07-04 NOTE — Progress Notes (Signed)
   07/04/22 2255  BiPAP/CPAP/SIPAP  BiPAP/CPAP/SIPAP Pt Type Adult  BiPAP/CPAP/SIPAP V60  Mask Type Full face mask  Mask Size Medium  Set Rate 20 breaths/min  Respiratory Rate 20 breaths/min  IPAP 18 cmH20  EPAP 6 cmH2O  FiO2 (%) 50 %  Minute Ventilation 12  Leak 12  Peak Inspiratory Pressure (PIP) 17  Tidal Volume (Vt) 620  Patient Home Equipment No  Auto Titrate No  Press High Alarm 35 cmH2O  Press Low Alarm 5 cmH2O  CPAP/SIPAP surface wiped down Yes

## 2022-07-04 NOTE — H&P (Signed)
History and Physical  Jasmin Munoz:[id number] DOB: May 22, 1950 DOA: 07/04/2022  Referring physician: Dr. Renaye Rakers, EDP  PCP: [facility]  Outpatient Specialists: Pulmonary Patient coming from: Home.  Chief Complaint: Progressively worsening shortness of breath.  HPI: Jasmin Munoz is a 72 y.o. female with medical history significant for COPD on chronic steroid use, current tobacco use, chronic anxiety/depression, fibromyalgia, who presented to [facility] ED from home via EMS due to progressively worsening shortness of breath.  The patient was just admitted and hospitalized for 2 days, discharged on 06/25/2022 for the same complaints.  EMS was activated.  Upon EMS arrival, the patient received IV magnesium 2 mg x 1, IV Solu-Medrol 125 mg x 1, albuterol nebs 10 mg x 1, Atrovent nebs 0.5 mg x 1.  The patient was saturating 90% on room air.  She was brought into the ED for further evaluation.  In the ED, chest x-ray was nonacute..  Venous blood gas was reassuring.  Chest x-ray was nonacute.  Due to increased work of breathing, the patient was placed on BiPAP with improvement.  Additionally, she received albuterol nebs in the ED.  TRH, hospitalist service, was asked to admit.  ED Course: Temperature 98.3.  BP 174/117, pulse 95, respiratory 27, O2 saturation 99% on 2 L.  Lab studies remarkable for serum potassium 3.1, glucose 119, creatinine 1.14 with GFR 51.  BNP 70.  WBC 17.0.  Neutrophil count 10.2.  Review of Systems: Review of systems as noted in the HPI. All other systems reviewed and are negative.   Past Medical History:  Diagnosis Date   Anxiety    Arthritis    Cataract    COPD (chronic obstructive pulmonary disease) (HCC)    Depression    Fibromyalgia    Macular degeneration    Seizures (HCC)    Past Surgical History:  Procedure Laterality Date   ABDOMINAL HYSTERECTOMY      Social History:  reports that she quit smoking about 3 months ago. Her smoking use included  cigarettes. She has a 35.00 pack-year smoking history. She uses smokeless tobacco. She reports that she does not drink alcohol and does not use drugs.   Allergies  Allergen Reactions   Sulfa Antibiotics Hives    Family History  Problem Relation Age of Onset   Emphysema Mother    Arthritis Father    Anxiety disorder Sister    Drug abuse Sister    Depression Sister    Alcohol abuse Sister    Bipolar disorder Sister    Arthritis Sister    Joint hypermobility Brother       Prior to Admission medications   Medication Sig Start Date End Date Taking? Authorizing Provider  albuterol (VENTOLIN HFA) 108 (90 Base) MCG/ACT inhaler Inhale 2 puffs into the lungs every 6 (six) hours as needed for wheezing or shortness of breath. 03/18/22   Hunsucker, Lesia Sago, MD  aluminum-magnesium hydroxide 200-200 MG/5ML suspension Take 15 mLs by mouth every 6 (six) hours as needed for indigestion.    [provider]  bismuth subsalicylate (PEPTO BISMOL) 262 MG/15ML suspension Take 30 mLs by mouth every 6 (six) hours as needed for indigestion or diarrhea or loose stools.    [provider]  BREZTRI AEROSPHERE 160-9-4.8 MCG/ACT AERO INHALE 2 PUFFS INTO THE LUNGS IN THE MORNING AND AT BEDTIME. 01/12/22   Hunsucker, Lesia Sago, MD  citalopram (CELEXA) 20 MG tablet Take 1 tablet (20 mg total) by mouth daily. 06/22/22   White,  Watt Climes, NP  diazepam (VALIUM) 10 MG tablet Take 1 tablet (10 mg total) by mouth 2 (two) times daily as needed for anxiety. 06/21/22   Joan Flores, NP  doxycycline (VIBRA-TABS) 100 MG tablet Take 1 tablet (100 mg total) by mouth 2 (two) times daily. 06/18/22   Hunsucker, Lesia Sago, MD  Dupilumab (DUPIXENT) 300 MG/2ML SOPN Inject 300 mg into the skin every 14 (fourteen) days. Loading dose completed in clinic on 03/18/2022 03/18/22   Hunsucker, Lesia Sago, MD  HYDROcodone-acetaminophen (NORCO/VICODIN) 5-325 MG tablet Take 1 tablet by mouth every 6 (six) hours as needed for moderate  pain or severe pain. 06/12/21   [provider]  predniSONE (DELTASONE) 10 MG tablet Take 1 tablet (10 mg total) by mouth daily with breakfast. restart chronic daily dose after prednisone taper 06/25/22   Tyrone Nine, MD  predniSONE (DELTASONE) 20 MG tablet Take 2 tablets (40 mg total) by mouth daily with breakfast for 5 days, THEN 1 tablet (20 mg total) daily with breakfast for 5 days. 06/25/22 07/05/22  Tyrone Nine, MD    Physical Exam: BP (!) 174/117   Pulse 89   Temp 98.3 F (36.8 C) (Axillary)   Resp 20   SpO2 99%   General: 72 y.o. year-old female well developed well nourished in no acute distress.  Alert and oriented x3. Cardiovascular: Regular rate and rhythm with no rubs or gallops.  No thyromegaly or JVD noted.  No lower extremity edema. 2/4 pulses in all 4 extremities. Respiratory: Faint rales at bases.  Poor inspiratory effort. Abdomen: Soft nontender nondistended with normal bowel sounds x4 quadrants. Muskuloskeletal: No cyanosis, clubbing or edema noted bilaterally Neuro: CN II-XII intact, strength, sensation, reflexes Skin: No ulcerative lesions noted or rashes Psychiatry: Judgement and insight appear normal. Mood is appropriate for condition and setting          Labs on Admission:  Basic Metabolic Panel: Recent Labs  Lab 07/04/22 2031  NA 140  K 3.1*  CL 102  CO2 28  GLUCOSE 119*  BUN 19  CREATININE 1.14*  CALCIUM 8.8*   Liver Function Tests: No results for input(s): "AST", "ALT", "ALKPHOS", "BILITOT", "PROT", "ALBUMIN" in the last 168 hours. No results for input(s): "LIPASE", "AMYLASE" in the last 168 hours. No results for input(s): "AMMONIA" in the last 168 hours. CBC: Recent Labs  Lab 07/04/22 2031  WBC 17.0*  NEUTROABS 10.2*  HGB 12.9  HCT 41.3  MCV 96.3  PLT 281   Cardiac Enzymes: No results for input(s): "CKTOTAL", "CKMB", "CKMBINDEX", "TROPONINI" in the last 168 hours.  BNP (last 3 results) Recent Labs    02/06/22 0033  07/04/22 2032  BNP 43.7 70.0    ProBNP (last 3 results) No results for input(s): "PROBNP" in the last 8760 hours.  CBG: No results for input(s): "GLUCAP" in the last 168 hours.  Radiological Exams on Admission: DG Chest Port 1 View  Result Date: 07/04/2022 CLINICAL DATA:  Shortness of breath.  History of COPD. EXAM: PORTABLE CHEST 1 VIEW COMPARISON:  06/23/2022 FINDINGS: Heart size and pulmonary vascularity are normal. Emphysematous changes in the lungs. No airspace disease or consolidation. No pleural effusions. No pneumothorax. Mediastinal contours appear intact. Calcification of the aorta. No change since prior study. IMPRESSION: Emphysematous changes in the lungs. No evidence of active pulmonary disease. Electronically Signed   By: Burman Nieves M.D.   On: 07/04/2022 20:59    EKG: I independently viewed the EKG done and my findings  are as followed: Normal sinus rhythm rate of 94.  Nonspecific ST-T changes.  QTc 474.  Assessment/Plan Present on Admission:  COPD with acute exacerbation (HCC)  Principal Problem:   COPD with acute exacerbation (HCC)  COPD with acute exacerbation Current tobacco user, the patient is trying to cut down on tobacco use. Frequent presentation for the same Recommend complete tobacco cessation, the patient was receptive. IV Solu-Medrol IV bronchodilators Antitussives as needed Incentive spirometer, pulmonary toilet Early mobilization Maintain O2 saturation above 90%.  Tobacco use disorder Recommend complete tobacco cessation TOC to assist with providing resources for complete tobacco cessation. Nicotine patch  Hypokalemia Presented with serum potassium of 3.1 Repleted intravenously Repeat BMP and obtain magnesium level  CKD 3 A Appears to be at baseline creatinine 1.1 with GFR 51. Avoid nephrotoxic agents, dehydration and hypotension. Monitor urine output  Chronic anxiety/depression No acute issues reported. Resume home  regimen    DVT prophylaxis: Subcu Lovenox daily  Code Status: Full code  Family Communication: None at bedside  Disposition Plan: Admitted to progressive care unit  Consults called: None.  Admission status: Inpatient status.   Status is: Inpatient The patient requires at least 2 midnights for further evaluation and treatment of present condition.   Darlin Drop MD [facility] Pager [phone]  If 7PM-7AM, please contact night-coverage [email or web address] Password [facility]  07/04/2022, 10:02 PM

## 2022-07-05 DIAGNOSIS — J9601 Acute respiratory failure with hypoxia: Secondary | ICD-10-CM

## 2022-07-05 DIAGNOSIS — J441 Chronic obstructive pulmonary disease with (acute) exacerbation: Secondary | ICD-10-CM | POA: Diagnosis not present

## 2022-07-05 LAB — BASIC METABOLIC PANEL
Anion gap: 8 (ref 5–15)
BUN: 22 mg/dL (ref 8–23)
CO2: 24 mmol/L (ref 22–32)
Calcium: 7.9 mg/dL — ABNORMAL LOW (ref 8.9–10.3)
Chloride: 105 mmol/L (ref 98–111)
Creatinine, Ser: 1.05 mg/dL — ABNORMAL HIGH (ref 0.44–1.00)
GFR, Estimated: 57 mL/min — ABNORMAL LOW (ref >60)
Glucose, Bld: 131 mg/dL — ABNORMAL HIGH (ref 70–99)
Potassium: 4.1 mmol/L (ref 3.5–5.1)
Sodium: 137 mmol/L (ref 135–145)

## 2022-07-05 LAB — CBC
HCT: 38.2 % (ref 36.0–46.0)
Hemoglobin: 12.1 g/dL (ref 12.0–15.0)
MCH: 30.4 pg (ref 26.0–34.0)
MCHC: 31.7 g/dL (ref 30.0–36.0)
MCV: 96 fL (ref 80.0–100.0)
Platelets: 247 10*3/uL (ref 150–400)
RBC: 3.98 MIL/uL (ref 3.87–5.11)
RDW: 12.6 % (ref 11.5–15.5)
WBC: 13.6 10*3/uL — ABNORMAL HIGH (ref 4.0–10.5)
nRBC: 0 % (ref 0.0–0.2)

## 2022-07-05 LAB — PHOSPHORUS: Phosphorus: 3.5 mg/dL (ref 2.5–4.6)

## 2022-07-05 LAB — MAGNESIUM: Magnesium: 3 mg/dL — ABNORMAL HIGH (ref 1.7–2.4)

## 2022-07-05 MED ORDER — ALBUTEROL SULFATE (2.5 MG/3ML) 0.083% IN NEBU
2.5000 mg | INHALATION_SOLUTION | Freq: Four times a day (QID) | RESPIRATORY_TRACT | Status: DC | PRN
  Administered 2022-07-05 – 2022-07-06 (×2): 2.5 mg via RESPIRATORY_TRACT
  Filled 2022-07-05 (×2): qty 3

## 2022-07-05 MED ORDER — HYDROCODONE-ACETAMINOPHEN 5-325 MG PO TABS: 2.0000 | ORAL_TABLET | Freq: Every day | ORAL | Status: DC

## 2022-07-05 MED ORDER — METHYLPREDNISOLONE SODIUM SUCC 40 MG IJ SOLR
40.0000 mg | Freq: Two times a day (BID) | INTRAMUSCULAR | Status: DC
  Administered 2022-07-05 (×2): 40 mg via INTRAVENOUS
  Filled 2022-07-05 (×2): qty 1

## 2022-07-05 MED ORDER — POTASSIUM CHLORIDE CRYS ER 20 MEQ PO TBCR
40.0000 meq | EXTENDED_RELEASE_TABLET | Freq: Two times a day (BID) | ORAL | Status: AC
  Administered 2022-07-05 (×2): 40 meq via ORAL
  Filled 2022-07-05 (×2): qty 2

## 2022-07-05 MED ORDER — DOXYCYCLINE HYCLATE 100 MG PO TABS: 100.0000 mg | ORAL_TABLET | Freq: Two times a day (BID) | ORAL | Status: DC

## 2022-07-05 MED ORDER — HYDROCODONE-ACETAMINOPHEN 5-325 MG PO TABS
2.0000 | ORAL_TABLET | Freq: Every day | ORAL | Status: DC
  Administered 2022-07-05 – 2022-07-07 (×3): 2 via ORAL
  Filled 2022-07-05 (×3): qty 2

## 2022-07-05 MED ORDER — ALBUTEROL SULFATE (2.5 MG/3ML) 0.083% IN NEBU
2.5000 mg | INHALATION_SOLUTION | RESPIRATORY_TRACT | Status: DC
  Administered 2022-07-05: 2.5 mg via RESPIRATORY_TRACT
  Filled 2022-07-05: qty 3

## 2022-07-05 MED ORDER — DIAZEPAM 5 MG PO TABS
10.0000 mg | ORAL_TABLET | Freq: Two times a day (BID) | ORAL | Status: DC | PRN
  Administered 2022-07-05 – 2022-07-08 (×4): 10 mg via ORAL
  Filled 2022-07-05 (×4): qty 2

## 2022-07-05 MED ORDER — HYDROCODONE-ACETAMINOPHEN 5-325 MG PO TABS: 1.0000 | ORAL_TABLET | ORAL | Status: DC

## 2022-07-05 MED ORDER — ALBUTEROL SULFATE HFA 108 (90 BASE) MCG/ACT IN AERS: 2.0000 | INHALATION_SPRAY | Freq: Four times a day (QID) | RESPIRATORY_TRACT | Status: DC | PRN

## 2022-07-05 MED ORDER — DOXYCYCLINE HYCLATE 100 MG PO TABS
100.0000 mg | ORAL_TABLET | Freq: Two times a day (BID) | ORAL | Status: DC
  Administered 2022-07-05 – 2022-07-08 (×7): 100 mg via ORAL
  Filled 2022-07-05 (×7): qty 1

## 2022-07-05 MED ORDER — ALBUTEROL SULFATE (2.5 MG/3ML) 0.083% IN NEBU
2.5000 mg | INHALATION_SOLUTION | Freq: Four times a day (QID) | RESPIRATORY_TRACT | Status: DC
  Administered 2022-07-05 – 2022-07-06 (×4): 2.5 mg via RESPIRATORY_TRACT
  Filled 2022-07-05 (×5): qty 3

## 2022-07-05 MED ORDER — ACETAMINOPHEN 325 MG PO TABS
650.0000 mg | ORAL_TABLET | Freq: Four times a day (QID) | ORAL | Status: DC | PRN
  Administered 2022-07-05 – 2022-07-08 (×2): 650 mg via ORAL
  Filled 2022-07-05 (×3): qty 2

## 2022-07-05 MED ORDER — UMECLIDINIUM BROMIDE 62.5 MCG/ACT IN AEPB
1.0000 | INHALATION_SPRAY | Freq: Every day | RESPIRATORY_TRACT | Status: DC
  Administered 2022-07-05 – 2022-07-07 (×3): 1 via RESPIRATORY_TRACT
  Filled 2022-07-05: qty 7

## 2022-07-05 MED ORDER — POLYETHYLENE GLYCOL 3350 17 G PO PACK: 17.0000 g | PACK | Freq: Every day | ORAL | Status: DC | PRN

## 2022-07-05 MED ORDER — FLUTICASONE FUROATE-VILANTEROL 200-25 MCG/ACT IN AEPB
1.0000 | INHALATION_SPRAY | Freq: Every day | RESPIRATORY_TRACT | Status: DC
  Administered 2022-07-05 – 2022-07-07 (×3): 1 via RESPIRATORY_TRACT
  Filled 2022-07-05: qty 28

## 2022-07-05 MED ORDER — MELATONIN 5 MG PO TABS
5.0000 mg | ORAL_TABLET | Freq: Every evening | ORAL | Status: DC | PRN
  Administered 2022-07-05 – 2022-07-07 (×3): 5 mg via ORAL
  Filled 2022-07-05 (×3): qty 1

## 2022-07-05 MED ORDER — ALBUTEROL SULFATE HFA 108 (90 BASE) MCG/ACT IN AERS
2.0000 | INHALATION_SPRAY | RESPIRATORY_TRACT | Status: DC
  Filled 2022-07-05: qty 6.7

## 2022-07-05 MED ORDER — BUDESON-GLYCOPYRROL-FORMOTEROL 160-9-4.8 MCG/ACT IN AERO: 2.0000 | INHALATION_SPRAY | Freq: Two times a day (BID) | RESPIRATORY_TRACT | Status: DC

## 2022-07-05 MED ORDER — HYDROCODONE-ACETAMINOPHEN 5-325 MG PO TABS: 1.0000 | ORAL_TABLET | Freq: Every day | ORAL | Status: DC

## 2022-07-05 MED ORDER — PROCHLORPERAZINE EDISYLATE 10 MG/2ML IJ SOLN: 5.0000 mg | Freq: Four times a day (QID) | INTRAMUSCULAR | Status: DC | PRN

## 2022-07-05 NOTE — Progress Notes (Signed)
   07/05/22 0320  BiPAP/CPAP/SIPAP  $ Non-Invasive Ventilator   (Pt is still off of bipap and doing well at this time. No resp distress noted. machine remained bedside.)

## 2022-07-05 NOTE — Evaluation (Signed)
Physical Therapy Evaluation Patient Details Name: Jasmin Munoz MRN: [id number] DOB: 07-11-1950 Today's Date: 07/05/2022  History of Present Illness  72 yo female admitted with COPD exac. Hx of COPD, fibromyalgia, anxiety, depression, Sz, smoker, macular degeneration  Clinical Impression  On eval, pt was Supv level for mobility. She walked ~120 feet around the unit. O2 94% on RA at rest, 87% on RA with ambulation. Dyspnea and significant wheezing after ambulation-O2 placed back on pt to aid recovery. Will plan to follow and progress activity as tolerated. Do not anticipate any f/u PT needs at this time.        Recommendations for follow up therapy are one component of a multi-disciplinary discharge planning process, led by the attending physician.  Recommendations may be updated based on patient status, additional functional criteria and insurance authorization.  Follow Up Recommendations       Assistance Recommended at Discharge PRN  Patient can return home with the following       Equipment Recommendations None recommended by PT  Recommendations for Other Services       Functional Status Assessment Patient has had a recent decline in their functional status and demonstrates the ability to make significant improvements in function in a reasonable and predictable amount of time.     Precautions / Restrictions Precautions Precaution Comments: monitor O2 Restrictions Weight Bearing Restrictions: No      Mobility  Bed Mobility Overal bed mobility: Modified Independent                  Transfers Overall transfer level: Modified independent                      Ambulation/Gait Ambulation/Gait assistance: Supervision Gait Distance (Feet): 125 Feet Assistive device: None Gait Pattern/deviations: Step-through pattern, Decreased stride length       General Gait Details: O2 87% on RA+dyspnea 34/+significant wheezing. Cues for pursed lip breathing and rest  breaks  Stairs            Wheelchair Mobility    Modified Rankin (Stroke Patients Only)       Balance Overall balance assessment: No apparent balance deficits (not formally assessed)                                           Pertinent Vitals/Pain Pain Assessment Pain Assessment: No/denies pain    Home Living Family/patient expects to be discharged to:: Private residence Living Arrangements: Alone Available Help at Discharge: Family;Available PRN/intermittently Type of Home: House       Alternate Level Stairs-Number of Steps: 1 flight Home Layout: Two level;Bed/bath upstairs Home Equipment: None      Prior Function Prior Level of Function : Independent/Modified Independent                     Hand Dominance        Extremity/Trunk Assessment   Upper Extremity Assessment Upper Extremity Assessment: Overall WFL for tasks assessed    Lower Extremity Assessment Lower Extremity Assessment: Overall WFL for tasks assessed    Cervical / Trunk Assessment Cervical / Trunk Assessment: Normal  Communication   Communication: No difficulties  Cognition Arousal/Alertness: Awake/alert Behavior During Therapy: WFL for tasks assessed/performed Overall Cognitive Status: Within Functional Limits for tasks assessed  General Comments      Exercises     Assessment/Plan    PT Assessment Patient needs continued PT services  PT Problem List Decreased mobility;Decreased activity tolerance       PT Treatment Interventions Gait training;Therapeutic exercise;Balance training;Functional mobility training;Therapeutic activities;Patient/family education    PT Goals (Current goals can be found in the Care Plan section)  Acute Rehab PT Goals Patient Stated Goal: to get better PT Goal Formulation: With patient Time For Goal Achievement: 07/19/22 Potential to Achieve Goals: Good     Frequency Min 1X/week     Co-evaluation               AM-PAC PT "6 Clicks" Mobility  Outcome Measure Help needed turning from your back to your side while in a flat bed without using bedrails?: None Help needed moving from lying on your back to sitting on the side of a flat bed without using bedrails?: None Help needed moving to and from a bed to a chair (including a wheelchair)?: None Help needed standing up from a chair using your arms (e.g., wheelchair or bedside chair)?: None Help needed to walk in hospital room?: A Little Help needed climbing 3-5 steps with a railing? : A Little 6 Click Score: 22    End of Session   Activity Tolerance: Patient tolerated treatment well Patient left: in bed;with call bell/phone within reach   PT Visit Diagnosis: Difficulty in walking, not elsewhere classified (R26.2)    Time: 1610-9604 PT Time Calculation (min) (ACUTE ONLY): 20 min   Charges:   PT Evaluation $PT Eval Low Complexity: 1 Low             Faye Ramsay, PT Acute Rehabilitation  Office: [phone]

## 2022-07-05 NOTE — Plan of Care (Signed)

## 2022-07-05 NOTE — TOC Initial Note (Addendum)
Transition of Care [facility]) - Initial/Assessment Note    Patient Details  Name: Jasmin Munoz MRN: [id number] Date of Birth: [birth year]  [facility] CM/SW Contact:    [name], RN Phone Number: 07/05/2022, 12:44 PM  Clinical Narrative: Per [name] dtr-patient active w/[facility] home 02-only has travel tank-does not have set up-left message w/[name] he will f/u w/[name].Provided w/SA resources to AVS.                Expected Discharge Plan: Home/Self Care Barriers to Discharge: Continued Medical Work up   Patient Goals and CMS Choice   CMS Medicare.gov Compare Post Acute Care list provided to:: Patient Represenative (must comment) [name] dtr) Choice offered to / list presented to : Adult Children [facility]es ownership interest in [facility].provided to:: Adult Children    Expected Discharge Plan and Services       Living arrangements for the past 2 months: Single Family Home                                      Prior Living Arrangements/Services Living arrangements for the past 2 months: Single Family Home Lives with:: Self              Current home services: DME ([facility])    Activities of Daily Living Home Assistive Devices/Equipment: Eyeglasses, Oxygen ADL Screening (condition at time of admission) Patient's cognitive ability adequate to safely complete daily activities?: Yes Is the patient deaf or have difficulty hearing?: No Does the patient have difficulty seeing, even when wearing glasses/contacts?: No Does the patient have difficulty concentrating, remembering, or making decisions?: No Patient able to express need for assistance with ADLs?: Yes Does the patient have difficulty dressing or bathing?: No Independently performs ADLs?: Yes (appropriate for developmental age) Does the patient have difficulty walking or climbing stairs?: Yes Weakness of Legs: Both Weakness of Arms/Hands: None  Permission Sought/Granted                   Emotional Assessment              Admission diagnosis:  COPD exacerbation (HCC) [J44.1] COPD with acute exacerbation (HCC) [J44.1] Patient Active Problem List   Diagnosis Date Noted   Acute respiratory failure with hypoxia (HCC) 06/24/2022   COPD exacerbation (HCC) 06/23/2022   Leukocytosis 06/23/2022   Hypermagnesemia 06/23/2022   Chest tightness 03/16/2022   MDD (major depressive disorder), recurrent episode, severe (HCC) 09/13/2017   COPD (chronic obstructive pulmonary disease) (HCC) 02/03/2013   COPD with acute exacerbation (HCC) 02/03/2013   Hypoxemia 02/03/2013   Generalized anxiety disorder 02/03/2013   HTN (hypertension) 02/03/2013   Nonspecific abnormal electrocardiogram (ECG) (EKG) 02/03/2013   DEPRESSION 05/24/2006   TENDINITIS 05/24/2006   PCP:  [facility]:   [id number] [place], [place] - [address] [place] [address] Phone: [phone] Fax: [phone]  [facility] - [place], [place] - [address] [place] [address] Phone: [phone] Fax: [phone]  [facility] - [address] [place] - [address] [place] [address] Phone: [phone] Fax: [phone]  [id number] - [place], [place] - [place] [address] [place] [address] Phone: [phone] Fax: [phone]  [facility] - [address] [place] - [address] [place] [address] Phone:  [phone] Fax: [phone]     Social Determinants of Health (SDOH) Social History: SDOH Screenings   Food Insecurity: No Food Insecurity (07/04/2022)  Housing: Patient Declined (07/04/2022)  Transportation Needs: No Transportation Needs (07/04/2022)  Utilities: Not At Risk (07/04/2022)  Alcohol Screen: Low Risk  (09/13/2017)  Depression (PHQ2-9): Low Risk  (10/31/2019)  Tobacco Use: High Risk (07/04/2022)    SDOH Interventions:     Readmission Risk Interventions     No data to display

## 2022-07-05 NOTE — Progress Notes (Signed)
[facility] PROGRESS NOTE    Progress Note  Jasmin Munoz  ZOX:[id number] DOB: October 25, 1950 DOA: 07/04/2022 PCP: [facility]     Brief Narrative:   Jasmin Munoz is an 72 y.o. female past medical history significant for COPD on chronic steroids, tobacco abuse fibromyalgia comes into the ED for progressing and worsening shortness of breath Recently discharged from the hospital on 06/25/2022 in the ED is found to be satting 90% on room air and dyspneic chest x-ray showed no acute findings, due to increased work of breathing she was placed on BiPAP, now she is on 5 L of oxygen satting at 100%.  Assessment/Plan:   Acute respiratory failure with hypoxia secondary to COPD with acute exacerbation (HCC) Current tobacco user.  She started back smoking soon as she was discharged from the hospital. She has been counseled. She was started on inhalers and will start her on steroids and antibiotics this morning. Continues inspirometry flutter valve. Out of bed to chair. Early mobilization and check saturations with ambulation.  Tobacco abuse: Noted, she has been counseled.  Hypokalemia: Replete orally now resolved.   Chronic kidney disease stage IIIa: Kathrynn Running seems to base line.  Chronic anxiety/depression: Continue citalopram, diazepam as needed    DVT prophylaxis: lovenox Family Communication:none Status is: Inpatient Remains inpatient appropriate because: Acute respiratory failure with hypoxia    Code Status:     Code Status Orders  (From admission, onward)           Start     Ordered   07/04/22 2204  Full code  Continuous       Question:  By:  Answer:  Consent: discussion documented in EHR   07/04/22 2203           Code Status History     Date Active Date Inactive Code Status Order ID Comments User Context   06/23/2022 1827 06/25/2022 1904 DNR [id number]  Nolberto Hanlon, MD ED   02/06/2022 0114 02/06/2022 1505 DNR [id number]  Tilden Fossa, MD ED    09/09/2021 1121 02/05/2022 2354 DNR [id number]  Hunsucker, Lesia Sago, MD Outpatient   12/14/2019 0237 12/14/2019 1911 Full Code [id number]  Roxy Horseman, PA-C ED   12/03/2018 1635 12/04/2018 1526 Full Code [id number]  Lenward Chancellor D, DO ED   09/13/2017 1506 09/16/2017 1228 Full Code [id number]  Truman Hayward, FNP Inpatient   09/13/2017 1506 09/13/2017 1506 Full Code [id number]  Truman Hayward, FNP Inpatient   09/13/2017 0216 09/13/2017 1401 Full Code [id number]  Nira Conn, MD ED   02/03/2013 1738 02/04/2013 1858 Full Code [id number]  Hollice Espy, MD Inpatient      Advance Directive Documentation    Flowsheet Row Most Recent Value  Type of Advance Directive Living will  Pre-existing out of facility DNR order (yellow form or pink MOST form) --  "MOST" Form in Place? --         IV Access:   Peripheral IV   Procedures and diagnostic studies:   DG Chest Port 1 View  Result Date: 07/04/2022 CLINICAL DATA:  Shortness of breath.  History of COPD. EXAM: PORTABLE CHEST 1 VIEW COMPARISON:  06/23/2022 FINDINGS: Heart size and pulmonary vascularity are normal. Emphysematous changes in the lungs. No airspace disease or consolidation. No pleural effusions. No pneumothorax. Mediastinal contours appear intact. Calcification of the aorta. No change since prior study. IMPRESSION: Emphysematous changes in the lungs. No evidence of active pulmonary disease. Electronically Signed   By: Burman Nieves  M.D.   On: 07/04/2022 20:59     Medical Consultants:   None.   Subjective:    Jasmin Munoz really her breathing is not improved.  Objective:    Vitals:   07/04/22 2309 07/05/22 0250 07/05/22 0250 07/05/22 0557  BP:  (!) 122/90 (!) 122/90 109/75  Pulse:  88 89 96  Resp:  20 20 (!) 22  Temp:  97.7 F (36.5 C) 97.7 F (36.5 C) 98.1 F (36.7 C)  TempSrc:  Oral Oral Oral  SpO2:  99% 100% 100%  Weight: 47.6 kg     Height: 5\' 1"  (1.549 m)      SpO2: 100 % O2 Flow Rate  (L/min): 5 L/min FiO2 (%): 50 %   Intake/Output Summary (Last 24 hours) at 07/05/2022 0724 Last data filed at 07/05/2022 0557 Gross per 24 hour  Intake 0 ml  Output --  Net 0 ml   Filed Weights   07/04/22 2309  Weight: 47.6 kg    Exam: General exam: In no acute distress. Respiratory system: Good air movement and wheezing bilaterally Cardiovascular system: S1 & S2 heard, RRR. No JVD. Gastrointestinal system: Abdomen is nondistended, soft and nontender.  Extremities: No pedal edema. Skin: No rashes, lesions or ulcers Psychiatry: Judgement and insight appear normal. Mood & affect appropriate.    Data Reviewed:    Labs: Basic Metabolic Panel: Recent Labs  Lab 07/04/22 2031 07/05/22 0452  NA 140 137  K 3.1* 4.1  CL 102 105  CO2 28 24  GLUCOSE 119* 131*  BUN 19 22  CREATININE 1.14* 1.05*  CALCIUM 8.8* 7.9*  MG  --  3.0*  PHOS  --  3.5   GFR Estimated Creatinine Clearance: 36.9 mL/min (A) (by C-G formula based on SCr of 1.05 mg/dL (H)). Liver Function Tests: No results for input(s): "AST", "ALT", "ALKPHOS", "BILITOT", "PROT", "ALBUMIN" in the last 168 hours. No results for input(s): "LIPASE", "AMYLASE" in the last 168 hours. No results for input(s): "AMMONIA" in the last 168 hours. Coagulation profile No results for input(s): "INR", "PROTIME" in the last 168 hours. COVID-19 Labs  No results for input(s): "DDIMER", "FERRITIN", "LDH", "CRP" in the last 72 hours.  Lab Results  Component Value Date   SARSCOV2NAA NEGATIVE 07/04/2022   SARSCOV2NAA NEGATIVE 06/23/2022   SARSCOV2NAA NEGATIVE 03/22/2022   SARSCOV2NAA NEGATIVE 12/14/2019    CBC: Recent Labs  Lab 07/04/22 2031 07/05/22 0452  WBC 17.0* 13.6*  NEUTROABS 10.2*  --   HGB 12.9 12.1  HCT 41.3 38.2  MCV 96.3 96.0  PLT 281 247   Cardiac Enzymes: No results for input(s): "CKTOTAL", "CKMB", "CKMBINDEX", "TROPONINI" in the last 168 hours. BNP (last 3 results) No results for input(s): "PROBNP" in the  last 8760 hours. CBG: No results for input(s): "GLUCAP" in the last 168 hours. D-Dimer: No results for input(s): "DDIMER" in the last 72 hours. Hgb A1c: No results for input(s): "HGBA1C" in the last 72 hours. Lipid Profile: No results for input(s): "CHOL", "HDL", "LDLCALC", "TRIG", "CHOLHDL", "LDLDIRECT" in the last 72 hours. Thyroid function studies: No results for input(s): "TSH", "T4TOTAL", "T3FREE", "THYROIDAB" in the last 72 hours.  Invalid input(s): "FREET3" Anemia work up: No results for input(s): "VITAMINB12", "FOLATE", "FERRITIN", "TIBC", "IRON", "RETICCTPCT" in the last 72 hours. Sepsis Labs: Recent Labs  Lab 07/04/22 2031 07/05/22 0452  WBC 17.0* 13.6*   Microbiology Recent Results (from the past 240 hour(s))  Resp panel by RT-PCR (RSV, Flu A&B, Covid)     Status:  None   Collection Time: 07/04/22  8:44 PM  Result Value Ref Range Status   SARS Coronavirus 2 by RT PCR NEGATIVE NEGATIVE Final    Comment: (NOTE) SARS-CoV-2 target nucleic acids are NOT DETECTED.  The SARS-CoV-2 RNA is generally detectable in upper respiratory specimens during the acute phase of infection. The lowest concentration of SARS-CoV-2 viral copies this assay can detect is 138 copies/mL. A negative result does not preclude SARS-Cov-2 infection and should not be used as the sole basis for treatment or other patient management decisions. A negative result may occur with  improper specimen collection/handling, submission of specimen other than nasopharyngeal swab, presence of viral mutation(s) within the areas targeted by this assay, and inadequate number of viral copies(<138 copies/mL). A negative result must be combined with clinical observations, patient history, and epidemiological information. The expected result is Negative.  Fact Sheet for Patients:  BloggerCourse.com  Fact Sheet for Healthcare Providers:  SeriousBroker.it  This test  is no t yet approved or cleared by the [place] FDA and  has been authorized for detection and/or diagnosis of SARS-CoV-2 by FDA under an Emergency Use Authorization (EUA). This EUA will remain  in effect (meaning this test can be used) for the duration of the COVID-19 declaration under Section 564(b)(1) of the Act, 21 U.S.C.section 360bbb-3(b)(1), unless the authorization is terminated  or revoked sooner.       Influenza A by PCR NEGATIVE NEGATIVE Final   Influenza B by PCR NEGATIVE NEGATIVE Final    Comment: (NOTE) The Xpert Xpress SARS-CoV-2/FLU/RSV plus assay is intended as an aid in the diagnosis of influenza from Nasopharyngeal swab specimens and should not be used as a sole basis for treatment. Nasal washings and aspirates are unacceptable for Xpert Xpress SARS-CoV-2/FLU/RSV testing.  Fact Sheet for Patients: BloggerCourse.com  Fact Sheet for Healthcare Providers: SeriousBroker.it  This test is not yet approved or cleared by the [place] FDA and has been authorized for detection and/or diagnosis of SARS-CoV-2 by FDA under an Emergency Use Authorization (EUA). This EUA will remain in effect (meaning this test can be used) for the duration of the COVID-19 declaration under Section 564(b)(1) of the Act, 21 U.S.C. section 360bbb-3(b)(1), unless the authorization is terminated or revoked.     Resp Syncytial Virus by PCR NEGATIVE NEGATIVE Final    Comment: (NOTE) Fact Sheet for Patients: BloggerCourse.com  Fact Sheet for Healthcare Providers: SeriousBroker.it  This test is not yet approved or cleared by the [place] FDA and has been authorized for detection and/or diagnosis of SARS-CoV-2 by FDA under an Emergency Use Authorization (EUA). This EUA will remain in effect (meaning this test can be used) for the duration of the COVID-19 declaration under Section  564(b)(1) of the Act, 21 U.S.C. section 360bbb-3(b)(1), unless the authorization is terminated or revoked.  Performed at [facility], [address], [place], [place] [address]      Medications:    citalopram  20 mg Oral Daily   enoxaparin (LOVENOX) injection  40 mg Subcutaneous Q24H   umeclidinium bromide  1 puff Inhalation Daily   And   fluticasone furoate-vilanterol  1 puff Inhalation Daily   nicotine  14 mg Transdermal Daily   potassium chloride  40 mEq Oral BID   Continuous Infusions:    LOS: 1 day   Marinda Elk  [facility]  07/05/2022, 7:24 AM

## 2022-07-05 NOTE — Telephone Encounter (Signed)
Attempted to call pt but unable to reach. Checked pt's chart and saw that she was readmitted to the hospital 6/2.

## 2022-07-05 NOTE — Progress Notes (Signed)
Pt is off Bipap per Dr. Margo Aye to evaluate pt without it. RT notified. Pt placed on 4L O2 [employer]. Pt is no acute distress. Will continue to monitor pt.

## 2022-07-06 DIAGNOSIS — J441 Chronic obstructive pulmonary disease with (acute) exacerbation: Secondary | ICD-10-CM | POA: Diagnosis not present

## 2022-07-06 DIAGNOSIS — J9601 Acute respiratory failure with hypoxia: Secondary | ICD-10-CM | POA: Diagnosis not present

## 2022-07-06 MED ORDER — ALBUTEROL SULFATE (2.5 MG/3ML) 0.083% IN NEBU
2.5000 mg | INHALATION_SOLUTION | Freq: Three times a day (TID) | RESPIRATORY_TRACT | Status: DC
  Administered 2022-07-07: 2.5 mg via RESPIRATORY_TRACT
  Filled 2022-07-06: qty 3

## 2022-07-06 MED ORDER — PREDNISONE 20 MG PO TABS: 60.0000 mg | ORAL_TABLET | Freq: Every day | ORAL | Status: DC

## 2022-07-06 MED ORDER — PREDNISONE 20 MG PO TABS
60.0000 mg | ORAL_TABLET | Freq: Every day | ORAL | Status: DC
  Administered 2022-07-06 – 2022-07-08 (×3): 60 mg via ORAL
  Filled 2022-07-06 (×3): qty 3

## 2022-07-06 MED ORDER — ALUM & MAG HYDROXIDE-SIMETH 200-200-20 MG/5ML PO SUSP
30.0000 mL | ORAL | Status: DC | PRN
  Administered 2022-07-06 – 2022-07-08 (×6): 30 mL via ORAL
  Filled 2022-07-06 (×6): qty 30

## 2022-07-06 MED ORDER — POLYETHYLENE GLYCOL 3350 17 G PO PACK
17.0000 g | PACK | Freq: Two times a day (BID) | ORAL | Status: AC
  Filled 2022-07-06 (×3): qty 1

## 2022-07-06 NOTE — Plan of Care (Signed)
  Problem: Education: Goal: Knowledge of General Education information will improve Description: Including pain rating scale, medication(s)/side effects and non-pharmacologic comfort measures Outcome: Completed/Met   Problem: Health Behavior/Discharge Planning: Goal: Ability to manage health-related needs will improve Outcome: Progressing   Problem: Clinical Measurements: Goal: Ability to maintain clinical measurements within normal limits will improve Outcome: Progressing Goal: Will remain free from infection Outcome: Progressing Goal: Diagnostic test results will improve Outcome: Progressing Goal: Respiratory complications will improve Outcome: Progressing Goal: Cardiovascular complication will be avoided Outcome: Progressing   Problem: Activity: Goal: Risk for activity intolerance will decrease Outcome: Progressing   Problem: Nutrition: Goal: Adequate nutrition will be maintained Outcome: Progressing   Problem: Coping: Goal: Level of anxiety will decrease Outcome: Progressing   Problem: Elimination: Goal: Will not experience complications related to bowel motility Outcome: Adequate for Discharge Goal: Will not experience complications related to urinary retention Outcome: Adequate for Discharge   Problem: Pain Managment: Goal: General experience of comfort will improve Outcome: Adequate for Discharge   Problem: Safety: Goal: Ability to remain free from injury will improve Outcome: Adequate for Discharge   Problem: Skin Integrity: Goal: Risk for impaired skin integrity will decrease Outcome: Adequate for Discharge

## 2022-07-06 NOTE — Progress Notes (Signed)
[facility] PROGRESS NOTE    Progress Note  Jasmin Munoz  NUU:[id number] DOB: [birth year] DOA: 07/04/2022 PCP: [facility]     Brief Narrative:   Jasmin Munoz is an 72 y.o. female past medical history significant for COPD on chronic steroids, tobacco abuse fibromyalgia comes into the ED for progressing and worsening shortness of breath Recently discharged from the hospital on 06/25/2022 in the ED is found to be satting 90% on room air and dyspneic chest x-ray showed no acute findings, due to increased work of breathing she was placed on BiPAP, now she is on 5 L of oxygen satting at 100%.  Assessment/Plan:   Acute respiratory failure with hypoxia secondary to COPD with acute exacerbation (HCC) Current tobacco user.  She started back smoking soon as she was discharged from the hospital. She has been counseled. Continue inhalers, antibiotics and steroids. Out of bed to chair. Early mobilization and check saturations with ambulation.  Tobacco abuse: Noted, she has been counseled.  Hypokalemia: Replete orally now resolved.  Chronic kidney disease stage IIIa: At baseline  Chronic anxiety/depression: Continue citalopram, diazepam as needed    DVT prophylaxis: lovenox Family Communication:none Status is: Inpatient Remains inpatient appropriate because: Acute respiratory failure with hypoxia    Code Status:     Code Status Orders  (From admission, onward)           Start     Ordered   07/04/22 2204  Full code  Continuous       Question:  By:  Answer:  Consent: discussion documented in EHR   07/04/22 2203           Code Status History     Date Active Date Inactive Code Status Order ID Comments User Context   06/23/2022 1827 06/25/2022 1904 DNR [id number]  Nolberto Hanlon, MD ED   02/06/2022 0114 02/06/2022 1505 DNR [id number]  Tilden Fossa, MD ED   09/09/2021 1121 02/05/2022 2354 DNR [id number]  Hunsucker, Lesia Sago, MD Outpatient   12/14/2019 0237  12/14/2019 1911 Full Code [id number]  Roxy Horseman, PA-C ED   12/03/2018 1635 12/04/2018 1526 Full Code [id number]  Lenward Chancellor D, DO ED   09/13/2017 1506 09/16/2017 1228 Full Code [id number]  Truman Hayward, FNP Inpatient   09/13/2017 1506 09/13/2017 1506 Full Code [id number]  Truman Hayward, FNP Inpatient   09/13/2017 0216 09/13/2017 1401 Full Code [id number]  Nira Conn, MD ED   02/03/2013 1738 02/04/2013 1858 Full Code [id number]  Hollice Espy, MD Inpatient      Advance Directive Documentation    Flowsheet Row Most Recent Value  Type of Advance Directive Living will  Pre-existing out of facility DNR order (yellow form or pink MOST form) --  "MOST" Form in Place? --         IV Access:   Peripheral IV   Procedures and diagnostic studies:   DG Chest Port 1 View  Result Date: 07/04/2022 CLINICAL DATA:  Shortness of breath.  History of COPD. EXAM: PORTABLE CHEST 1 VIEW COMPARISON:  06/23/2022 FINDINGS: Heart size and pulmonary vascularity are normal. Emphysematous changes in the lungs. No airspace disease or consolidation. No pleural effusions. No pneumothorax. Mediastinal contours appear intact. Calcification of the aorta. No change since prior study. IMPRESSION: Emphysematous changes in the lungs. No evidence of active pulmonary disease. Electronically Signed   By: Burman Nieves M.D.   On: 07/04/2022 20:59     Medical Consultants:   None.   Subjective:  Jasmin Munoz relates her breathing is improved.  Objective:    Vitals:   07/05/22 1814 07/05/22 2000 07/06/22 0459 07/06/22 0738  BP:  (!) 144/95 (!) 143/97   Pulse:  97 76   Resp:  18 18   Temp:  98.2 F (36.8 C) 98.5 F (36.9 C)   TempSrc:  Oral Oral   SpO2: 98% 99% 100% 99%  Weight:      Height:       SpO2: 99 % O2 Flow Rate (L/min): 3 L/min FiO2 (%): 50 %   Intake/Output Summary (Last 24 hours) at 07/06/2022 0830 Last data filed at 07/06/2022 0745 Gross per 24 hour  Intake 240 ml   Output 400 ml  Net -160 ml    Filed Weights   07/04/22 2309  Weight: 47.6 kg    Exam: General exam: In no acute distress. Respiratory system: Good air movement and clear to auscultation. Cardiovascular system: S1 & S2 heard, RRR. No JVD. Gastrointestinal system: Abdomen is nondistended, soft and nontender.  Extremities: No pedal edema. Skin: No rashes, lesions or ulcers Psychiatry: Judgement and insight appear normal. Mood & affect appropriate.  Data Reviewed:    Labs: Basic Metabolic Panel: Recent Labs  Lab 07/04/22 2031 07/05/22 0452  NA 140 137  K 3.1* 4.1  CL 102 105  CO2 28 24  GLUCOSE 119* 131*  BUN 19 22  CREATININE 1.14* 1.05*  CALCIUM 8.8* 7.9*  MG  --  3.0*  PHOS  --  3.5    GFR Estimated Creatinine Clearance: 36.9 mL/min (A) (by C-G formula based on SCr of 1.05 mg/dL (H)). Liver Function Tests: No results for input(s): "AST", "ALT", "ALKPHOS", "BILITOT", "PROT", "ALBUMIN" in the last 168 hours. No results for input(s): "LIPASE", "AMYLASE" in the last 168 hours. No results for input(s): "AMMONIA" in the last 168 hours. Coagulation profile No results for input(s): "INR", "PROTIME" in the last 168 hours. COVID-19 Labs  No results for input(s): "DDIMER", "FERRITIN", "LDH", "CRP" in the last 72 hours.  Lab Results  Component Value Date   SARSCOV2NAA NEGATIVE 07/04/2022   SARSCOV2NAA NEGATIVE 06/23/2022   SARSCOV2NAA NEGATIVE 03/22/2022   SARSCOV2NAA NEGATIVE 12/14/2019    CBC: Recent Labs  Lab 07/04/22 2031 07/05/22 0452  WBC 17.0* 13.6*  NEUTROABS 10.2*  --   HGB 12.9 12.1  HCT 41.3 38.2  MCV 96.3 96.0  PLT 281 247    Cardiac Enzymes: No results for input(s): "CKTOTAL", "CKMB", "CKMBINDEX", "TROPONINI" in the last 168 hours. BNP (last 3 results) No results for input(s): "PROBNP" in the last 8760 hours. CBG: No results for input(s): "GLUCAP" in the last 168 hours. D-Dimer: No results for input(s): "DDIMER" in the last 72  hours. Hgb A1c: No results for input(s): "HGBA1C" in the last 72 hours. Lipid Profile: No results for input(s): "CHOL", "HDL", "LDLCALC", "TRIG", "CHOLHDL", "LDLDIRECT" in the last 72 hours. Thyroid function studies: No results for input(s): "TSH", "T4TOTAL", "T3FREE", "THYROIDAB" in the last 72 hours.  Invalid input(s): "FREET3" Anemia work up: No results for input(s): "VITAMINB12", "FOLATE", "FERRITIN", "TIBC", "IRON", "RETICCTPCT" in the last 72 hours. Sepsis Labs: Recent Labs  Lab 07/04/22 2031 07/05/22 0452  WBC 17.0* 13.6*    Microbiology Recent Results (from the past 240 hour(s))  Resp panel by RT-PCR (RSV, Flu A&B, Covid)     Status: None   Collection Time: 07/04/22  8:44 PM  Result Value Ref Range Status   SARS Coronavirus 2 by RT PCR NEGATIVE NEGATIVE Final  Comment: (NOTE) SARS-CoV-2 target nucleic acids are NOT DETECTED.  The SARS-CoV-2 RNA is generally detectable in upper respiratory specimens during the acute phase of infection. The lowest concentration of SARS-CoV-2 viral copies this assay can detect is 138 copies/mL. A negative result does not preclude SARS-Cov-2 infection and should not be used as the sole basis for treatment or other patient management decisions. A negative result may occur with  improper specimen collection/handling, submission of specimen other than nasopharyngeal swab, presence of viral mutation(s) within the areas targeted by this assay, and inadequate number of viral copies(<138 copies/mL). A negative result must be combined with clinical observations, patient history, and epidemiological information. The expected result is Negative.  Fact Sheet for Patients:  BloggerCourse.com  Fact Sheet for Healthcare Providers:  SeriousBroker.it  This test is no t yet approved or cleared by the [place] FDA and  has been authorized for detection and/or diagnosis of SARS-CoV-2 by FDA  under an Emergency Use Authorization (EUA). This EUA will remain  in effect (meaning this test can be used) for the duration of the COVID-19 declaration under Section 564(b)(1) of the Act, 21 U.S.C.section 360bbb-3(b)(1), unless the authorization is terminated  or revoked sooner.       Influenza A by PCR NEGATIVE NEGATIVE Final   Influenza B by PCR NEGATIVE NEGATIVE Final    Comment: (NOTE) The Xpert Xpress SARS-CoV-2/FLU/RSV plus assay is intended as an aid in the diagnosis of influenza from Nasopharyngeal swab specimens and should not be used as a sole basis for treatment. Nasal washings and aspirates are unacceptable for Xpert Xpress SARS-CoV-2/FLU/RSV testing.  Fact Sheet for Patients: BloggerCourse.com  Fact Sheet for Healthcare Providers: SeriousBroker.it  This test is not yet approved or cleared by the [place] FDA and has been authorized for detection and/or diagnosis of SARS-CoV-2 by FDA under an Emergency Use Authorization (EUA). This EUA will remain in effect (meaning this test can be used) for the duration of the COVID-19 declaration under Section 564(b)(1) of the Act, 21 U.S.C. section 360bbb-3(b)(1), unless the authorization is terminated or revoked.     Resp Syncytial Virus by PCR NEGATIVE NEGATIVE Final    Comment: (NOTE) Fact Sheet for Patients: BloggerCourse.com  Fact Sheet for Healthcare Providers: SeriousBroker.it  This test is not yet approved or cleared by the [place] FDA and has been authorized for detection and/or diagnosis of SARS-CoV-2 by FDA under an Emergency Use Authorization (EUA). This EUA will remain in effect (meaning this test can be used) for the duration of the COVID-19 declaration under Section 564(b)(1) of the Act, 21 U.S.C. section 360bbb-3(b)(1), unless the authorization is terminated or revoked.  Performed at [facility], [address] [place], [place] [address]      Medications:    albuterol  2.5 mg Nebulization Q6H   citalopram  20 mg Oral Daily   doxycycline  100 mg Oral BID   enoxaparin (LOVENOX) injection  40 mg Subcutaneous Q24H   umeclidinium bromide  1 puff Inhalation Daily   And   fluticasone furoate-vilanterol  1 puff Inhalation Daily   HYDROcodone-acetaminophen  2 tablet Oral Daily   methylPREDNISolone (SOLU-MEDROL) injection  40 mg Intravenous Q12H   nicotine  14 mg Transdermal Daily   Continuous Infusions:    LOS: 2 days   Marinda Elk  [facility]  07/06/2022, 8:30 AM

## 2022-07-06 NOTE — Progress Notes (Signed)
PT Cancellation Note  Patient Details Name: Jasmin Munoz MRN: [id number] DOB: 1950-08-28   Cancelled Treatment:    Reason Eval/Treat Not Completed: Patient declined, no reason specified --C/R x 2 this a.m. Will check back another day.    Faye Ramsay, PT Acute Rehabilitation  Office: [phone]

## 2022-07-06 NOTE — TOC Progression Note (Addendum)
Transition of Care [facility]) - Progression Note    Patient Details  Name: Jasmin Munoz MRN: [id number] Date of Birth: Sep 15, 1950  [facility] of Care [facility]) CM/SW Contact  Horace Wishon, Olegario Messier, RN Phone Number: 07/06/2022, 2:24 PM  Clinical Narrative: spoke to [employer] rep Mitch about patient concerns about her 02 tanks & set up @ home to pick up-patient is no longer on 02 while in hospital. Marthann Schiller will also f/u on possible need for neb machine if ordered.      Expected Discharge Plan: Home/Self Care Barriers to Discharge: Continued Medical Work up  Expected Discharge Plan and Services       Living arrangements for the past 2 months: Single Family Home                                       Social Determinants of Health (SDOH) Interventions SDOH Screenings   Food Insecurity: No Food Insecurity (07/04/2022)  Housing: Patient Declined (07/04/2022)  Transportation Needs: No Transportation Needs (07/04/2022)  Utilities: Not At Risk (07/04/2022)  Alcohol Screen: Low Risk  (09/13/2017)  Depression (PHQ2-9): Low Risk  (10/31/2019)  Tobacco Use: High Risk (07/04/2022)    Readmission Risk Interventions     No data to display

## 2022-07-07 ENCOUNTER — Other Ambulatory Visit: Payer: Self-pay | Admitting: Pulmonary Disease

## 2022-07-07 DIAGNOSIS — R0602 Shortness of breath: Secondary | ICD-10-CM | POA: Diagnosis not present

## 2022-07-07 DIAGNOSIS — J441 Chronic obstructive pulmonary disease with (acute) exacerbation: Secondary | ICD-10-CM | POA: Diagnosis not present

## 2022-07-07 DIAGNOSIS — Z7189 Other specified counseling: Secondary | ICD-10-CM | POA: Diagnosis not present

## 2022-07-07 DIAGNOSIS — Z515 Encounter for palliative care: Secondary | ICD-10-CM | POA: Diagnosis not present

## 2022-07-07 DIAGNOSIS — Z79899 Other long term (current) drug therapy: Secondary | ICD-10-CM

## 2022-07-07 DIAGNOSIS — Z66 Do not resuscitate: Secondary | ICD-10-CM

## 2022-07-07 DIAGNOSIS — J9601 Acute respiratory failure with hypoxia: Secondary | ICD-10-CM | POA: Diagnosis not present

## 2022-07-07 DIAGNOSIS — Z720 Tobacco use: Secondary | ICD-10-CM

## 2022-07-07 MED ORDER — ARFORMOTEROL TARTRATE 15 MCG/2ML IN NEBU
15.0000 ug | INHALATION_SOLUTION | Freq: Two times a day (BID) | RESPIRATORY_TRACT | Status: DC
  Administered 2022-07-07 – 2022-07-08 (×3): 15 ug via RESPIRATORY_TRACT
  Filled 2022-07-07 (×4): qty 2

## 2022-07-07 MED ORDER — OXYCODONE HCL 5 MG PO TABS
5.0000 mg | ORAL_TABLET | ORAL | Status: DC | PRN
  Administered 2022-07-07 – 2022-07-08 (×3): 5 mg via ORAL
  Filled 2022-07-07 (×3): qty 1

## 2022-07-07 MED ORDER — REVEFENACIN 175 MCG/3ML IN SOLN
175.0000 ug | Freq: Every day | RESPIRATORY_TRACT | Status: DC
  Administered 2022-07-07 – 2022-07-08 (×2): 175 ug via RESPIRATORY_TRACT
  Filled 2022-07-07 (×2): qty 3

## 2022-07-07 MED ORDER — ORAL CARE MOUTH RINSE: 15.0000 mL | OROMUCOSAL | Status: DC | PRN

## 2022-07-07 MED ORDER — ALBUTEROL SULFATE (2.5 MG/3ML) 0.083% IN NEBU: 2.5000 mg | INHALATION_SOLUTION | RESPIRATORY_TRACT | Status: DC | PRN

## 2022-07-07 MED ORDER — BUDESONIDE 0.5 MG/2ML IN SUSP
0.5000 mg | Freq: Two times a day (BID) | RESPIRATORY_TRACT | Status: DC
  Administered 2022-07-07 – 2022-07-08 (×3): 0.5 mg via RESPIRATORY_TRACT
  Filled 2022-07-07 (×3): qty 2

## 2022-07-07 NOTE — Plan of Care (Signed)
  Problem: Health Behavior/Discharge Planning: Goal: Ability to manage health-related needs will improve Outcome: Progressing   Problem: Clinical Measurements: Goal: Ability to maintain clinical measurements within normal limits will improve Outcome: Progressing Goal: Will remain free from infection Outcome: Progressing Goal: Diagnostic test results will improve Outcome: Progressing Goal: Respiratory complications will improve Outcome: Progressing Goal: Cardiovascular complication will be avoided Outcome: Adequate for Discharge   Problem: Activity: Goal: Risk for activity intolerance will decrease Outcome: Progressing   Problem: Nutrition: Goal: Adequate nutrition will be maintained Outcome: Adequate for Discharge   Problem: Coping: Goal: Level of anxiety will decrease Outcome: Progressing   Problem: Elimination: Goal: Will not experience complications related to bowel motility Outcome: Adequate for Discharge Goal: Will not experience complications related to urinary retention Outcome: Adequate for Discharge   Problem: Pain Managment: Goal: General experience of comfort will improve Outcome: Adequate for Discharge   Problem: Safety: Goal: Ability to remain free from injury will improve Outcome: Adequate for Discharge   Problem: Skin Integrity: Goal: Risk for impaired skin integrity will decrease Outcome: Adequate for Discharge

## 2022-07-07 NOTE — Progress Notes (Signed)
RT note: No BiPAP in room.

## 2022-07-07 NOTE — Consult Note (Signed)
NAME:  Jasmin Munoz, MRN:  [id number], DOB:  May 31, 1950, LOS: 3 ADMISSION DATE:  07/04/2022, CONSULTATION DATE:  6/5 REFERRING MD:  Dr. Frederick Peers, CHIEF COMPLAINT:  COPD exacerbation   History of Present Illness:  72 year old female with history of asthma and COPD overlap followed in the pulmonary clinic by Dr. Judeth Horn. In addition to the usual inhaled therapies she is also treated with chronic prednisone and dupixent.  She was recently admitted for COPD exacerbation from 5/22 -5/24 and was treated with the typical modalities including nebulized bronchodilators and systemic steroids. She was able to be discharged home with home oxygen. Since returning home she continued to feel week and developed worsening dyspnea prompting her to return to [facility] 6/2. She was again admitted for COPD exacerbation after improving with steroids, nebs, and BiPAP in the ED. Treatment was initiated with steroid, nebs, and antibiotics. PCCM was consulted 6/5 for further management of COPD exacerbation. Jasmin Munoz tells me she is in a cycle of hospitalizations during which she improves and gets discharged just to get worse almost immediately. She is tired of this cycle and wants to stop suffering.   Pertinent  Medical History   has a past medical history of Anxiety, Arthritis, Cataract, COPD (chronic obstructive pulmonary disease) (HCC), Depression, Fibromyalgia, Macular degeneration, and Seizures (HCC).   Significant Hospital Events: Including procedures, antibiotic start and stop dates in addition to other pertinent events     Interim History / Subjective:    Objective   Blood pressure (!) 148/91, pulse 84, temperature 97.8 F (36.6 C), temperature source Oral, resp. rate (!) 24, height 5\' 1"  (1.549 m), weight 47.6 kg, SpO2 96 %.    FiO2 (%):  [21 %] 21 %   Intake/Output Summary (Last 24 hours) at 07/07/2022 1053 Last data filed at 07/07/2022 0818 Gross per 24 hour  Intake 600 ml  Output 400 ml  Net 200 ml    Filed Weights   07/04/22 2309  Weight: 47.6 kg    Examination: General: Frail elderly appearing female in NAD HENT: [place]/AT, PERRL, no JVD Lungs: Poor air movement throughout, no wheeze Cardiovascular: RRR, no MRG Abdomen: Soft, NT, ND Extremities: No acute deformity or ROM limitation Neuro: Alert, oriented, non-focal   Resolved Hospital Problem list     Assessment & Plan:   COPD exacerbation: end stage COPD at baseline on chronic steroids.  - Agree with steroids and doxycycline - Escalate inhaled therapy to LABA, LAMA, and ICS nebs. Might benefit from discharge on nebs - QHS BiPAP trial. If she perceives a benefit from this we can arrange for home use.  - She ought to qualify for hospice based on chronic steroid use and chronic hypercarbia. She has had issues in the past.  - She reports benefit with opioids. Currently taking hydrocodone and diazepam at home. Recommend palliative consult to help optimize her symptom management.  - Tobacco cessation counseling provided   Labs   CBC: Recent Labs  Lab 07/04/22 2031 07/05/22 0452  WBC 17.0* 13.6*  NEUTROABS 10.2*  --   HGB 12.9 12.1  HCT 41.3 38.2  MCV 96.3 96.0  PLT 281 247    Basic Metabolic Panel: Recent Labs  Lab 07/04/22 2031 07/05/22 0452  NA 140 137  K 3.1* 4.1  CL 102 105  CO2 28 24  GLUCOSE 119* 131*  BUN 19 22  CREATININE 1.14* 1.05*  CALCIUM 8.8* 7.9*  MG  --  3.0*  PHOS  --  3.5   GFR: Estimated Creatinine Clearance: 36.9 mL/min (A) (by C-G formula based on SCr of 1.05 mg/dL (H)). Recent Labs  Lab 07/04/22 2031 07/05/22 0452  WBC 17.0* 13.6*    Liver Function Tests: No results for input(s): "AST", "ALT", "ALKPHOS", "BILITOT", "PROT", "ALBUMIN" in the last 168 hours. No results for input(s): "LIPASE", "AMYLASE" in the last 168 hours. No results for input(s): "AMMONIA" in the last 168 hours.  ABG    Component Value Date/Time   PHART 7.39 06/23/2022 1850   PCO2ART 39 06/23/2022 1850    PO2ART 84 06/23/2022 1850   HCO3 32.7 (H) 07/04/2022 2038   ACIDBASEDEF 1.2 06/23/2022 1850   O2SAT 66.6 07/04/2022 2038     Coagulation Profile: No results for input(s): "INR", "PROTIME" in the last 168 hours.  Cardiac Enzymes: No results for input(s): "CKTOTAL", "CKMB", "CKMBINDEX", "TROPONINI" in the last 168 hours.  HbA1C: Hemoglobin A1C  Date/Time Value Ref Range Status  10/17/2019 04:09 PM 5.3 4.0 - 5.6 % Final   Hgb A1c MFr Bld  Date/Time Value Ref Range Status  09/14/2017 06:29 AM 5.0 4.8 - 5.6 % Final    Comment:    (NOTE) Pre diabetes:          5.7%-6.4% Diabetes:              >6.4% Glycemic control for   <7.0% adults with diabetes     CBG: No results for input(s): "GLUCAP" in the last 168 hours.  Review of Systems:   Bolds are positive  Constitutional: weight loss, gain, night sweats, Fevers, chills, fatigue .  HEENT: headaches, Sore throat, sneezing, nasal congestion, post nasal drip, Difficulty swallowing, Tooth/dental problems, visual complaints visual changes, ear ache CV:  chest pain, radiates:,Orthopnea, PND, swelling in lower extremities, dizziness, palpitations, syncope.  GI  heartburn, indigestion, abdominal pain, nausea, vomiting, diarrhea, change in bowel habits, loss of appetite, bloody stools.  Resp: cough, productive: , hemoptysis, dyspnea, chest pain, pleuritic.  Skin: rash or itching or icterus GU: dysuria, change in color of urine, urgency or frequency. flank pain, hematuria  MS: joint pain or swelling. decreased range of motion  Psych: change in mood or affect. depression or anxiety. Brain fog Neuro: difficulty with speech, weakness, numbness, ataxia    Past Medical History:  She,  has a past medical history of Anxiety, Arthritis, Cataract, COPD (chronic obstructive pulmonary disease) (HCC), Depression, Fibromyalgia, Macular degeneration, and Seizures (HCC).   Surgical History:   Past Surgical History:  Procedure Laterality Date    ABDOMINAL HYSTERECTOMY       Social History:   reports that she quit smoking about 3 months ago. Her smoking use included cigarettes. She has a 35.00 pack-year smoking history. She uses smokeless tobacco. She reports that she does not drink alcohol and does not use drugs.   Family History:  Her family history includes Alcohol abuse in her sister; Anxiety disorder in her sister; Arthritis in her father and sister; Bipolar disorder in her sister; Depression in her sister; Drug abuse in her sister; Emphysema in her mother; Joint hypermobility in her brother.   Allergies Allergies  Allergen Reactions   Atorvastatin Other (See Comments)    Blurry vision    Sulfa Antibiotics Hives     Home Medications  Prior to Admission medications   Medication Sig Start Date End Date Taking? Authorizing Provider  albuterol (VENTOLIN HFA) 108 (90 Base) MCG/ACT inhaler Inhale 2 puffs into the lungs every 6 (six) hours as needed for wheezing  or shortness of breath. 03/18/22  Yes Hunsucker, Lesia Sago, MD  BREZTRI AEROSPHERE 160-9-4.8 MCG/ACT AERO INHALE 2 PUFFS INTO THE LUNGS IN THE MORNING AND AT BEDTIME. 01/12/22  Yes Hunsucker, Lesia Sago, MD  citalopram (CELEXA) 20 MG tablet Take 1 tablet (20 mg total) by mouth daily. 06/22/22  Yes White, Arlys John A, NP  diazepam (VALIUM) 10 MG tablet Take 1 tablet (10 mg total) by mouth 2 (two) times daily as needed for anxiety. Patient taking differently: Take 20 mg by mouth at bedtime. 06/21/22  Yes White, Brian A, NP  Dupilumab (DUPIXENT) 300 MG/2ML SOPN Inject 300 mg into the skin every 14 (fourteen) days. Loading dose completed in clinic on 03/18/2022 03/18/22  Yes Hunsucker, Lesia Sago, MD  HYDROcodone-acetaminophen (NORCO/VICODIN) 5-325 MG tablet Take 1-2 tablets by mouth See admin instructions. Take 2 tablets by mouth in the morning and 1 tablet in the afternoon 06/12/21  Yes [provider]  naloxone (NARCAN) nasal spray 4 mg/0.1 mL Place 0.4 mg into the nose once.    Yes [provider]  naproxen sodium (ALEVE) 220 MG tablet Take 220-440 mg by mouth as needed (pain).   Yes [provider]  doxycycline (VIBRA-TABS) 100 MG tablet Take 1 tablet (100 mg total) by mouth 2 (two) times daily. Patient not taking: Reported on 07/05/2022 06/18/22   Hunsucker, Lesia Sago, MD     Critical care time:      Joneen Roach, AGACNP-BC [facility]  See Amion for personal pager PCCM on call pager [phone] until 7pm. Please call Elink 7p-7a. [phone]  07/07/2022 11:54 AM

## 2022-07-07 NOTE — Progress Notes (Signed)
Progress Note    Jasmin Munoz   WGN:[id number]  DOB: [birth year]  DOA: 07/04/2022     3 PCP: [facility]  Initial CC: SOB  Hospital Course: [name] is a 72 yo female with PMH COPD on chronic prednisone, ongoing tobacco use, fibromyalgia who presented with recurrent SOB/dyspnea and hypoxia. CXR showed emphysematous changes but no acute changes/findings. She was started on steroids and breathing treatments.   Interval History:  Patient seen this morning sitting up in bed appearing somewhat dyspneic.  Patient does give background which suggests she also has a lot of underlying anxiety.  Her home dose of Valium had been decreased in the past which she states has caused her to be less controlled from an anxiety standpoint.  She does remain compliant with her Celexa.  She has not felt much relief with her dyspnea with the recent regimen changes.  Assessment and Plan:  COPD with acute exacerbation Chronic hypercarbic respiratory failure Acute hypoxia  Current tobacco user.  She started back smoking soon as she was discharged from the hospital. - she has again been counseled on cessation - no significant improvement after admission on steroids; patient requested pulmonology eval given readmission - breathing treatments modified per pulm and trial of AVAPS and more nebs - continue prednisone - continue ambulating daily; some desat today with exertion but fast recovery at rest  - palliative care eval requested for change in opioid regimen to see if helps air hunger/dyspnea more    Tobacco abuse: - re-educated on cessation   Hypokalemia: Replete as needed   Chronic kidney disease stage IIIa: At baseline   Chronic anxiety/depression: Continue citalopram, diazepam as needed - may need further agents at followup   Old records reviewed in assessment of this patient  Antimicrobials: Doxycycline 07/05/2022 >> current  DVT prophylaxis:  enoxaparin (LOVENOX) injection 40  mg Start: 07/05/22 1000   Code Status:   Code Status: DNR  Mobility Assessment (last 72 hours)     Mobility Assessment     Row Name 07/07/22 0950 07/06/22 0930 07/06/22 0057 07/05/22 1540 07/05/22 1134   Does patient have an order for bedrest or is patient medically unstable No - Continue assessment No - Continue assessment No - Continue assessment -- No - Continue assessment   What is the highest level of mobility based on the progressive mobility assessment? Level 5 (Walks with assist in room/hall) - Balance while stepping forward/back and can walk in room with assist - Complete Level 5 (Walks with assist in room/hall) - Balance while stepping forward/back and can walk in room with assist - Complete Level 5 (Walks with assist in room/hall) - Balance while stepping forward/back and can walk in room with assist - Complete Level 5 (Walks with assist in room/hall) - Balance while stepping forward/back and can walk in room with assist - Complete Level 5 (Walks with assist in room/hall) - Balance while stepping forward/back and can walk in room with assist - Complete    Row Name 07/04/22 2309           Does patient have an order for bedrest or is patient medically unstable No - Continue assessment       What is the highest level of mobility based on the progressive mobility assessment? Level 5 (Walks with assist in room/hall) - Balance while stepping forward/back and can walk in room with assist - Complete  Barriers to discharge: none Disposition Plan:  Home Status is: Inpt  Objective: Blood pressure (!) 139/96, pulse 92, temperature (!) 97.5 F (36.4 C), temperature source Oral, resp. rate 20, height 5\' 1"  (1.549 m), weight 47.6 kg, SpO2 90 %.  Examination:  Physical Exam Constitutional:      General: She is not in acute distress.    Comments: Anxious appearing  HENT:     Head: Normocephalic and atraumatic.     Mouth/Throat:     Mouth: Mucous membranes are moist.   Eyes:     Extraocular Movements: Extraocular movements intact.  Cardiovascular:     Rate and Rhythm: Normal rate and regular rhythm.  Pulmonary:     Comments: Decreased air movement but no wheezing appreciated; scattered coarse sounds Abdominal:     General: Bowel sounds are normal. There is no distension.     Palpations: Abdomen is soft.     Tenderness: There is no abdominal tenderness.  Musculoskeletal:        General: No swelling. Normal range of motion.     Cervical back: Normal range of motion and neck supple.  Skin:    General: Skin is warm and dry.  Neurological:     General: No focal deficit present.     Mental Status: She is alert.  Psychiatric:        Mood and Affect: Mood normal.      Consultants:  Pulmonology   Procedures:    Data Reviewed: No results found for this or any previous visit (from the past 24 hour(s)).  I have reviewed pertinent nursing notes, vitals, labs, and images as necessary. I have ordered labwork to follow up on as indicated.  I have reviewed the last notes from staff over past 24 hours. I have discussed patient's care plan and test results with nursing staff, CM/SW, and other staff as appropriate.  Time spent: Greater than 50% of the 55 minute visit was spent in counseling/coordination of care for the patient as laid out in the A&P.   LOS: 3 days   Lewie Chamber, MD [facility] 07/07/2022, 4:11 PM

## 2022-07-07 NOTE — Consult Note (Addendum)
Consultation Note Date: 07/07/2022   Patient Name: Jasmin Munoz  DOB: 1950-08-01  MRN: [id number]  Age / Sex: 72 y.o., female   PCP: [facility] Referring Physician: Lewie Chamber, MD  Reason for Consultation: Establishing goals of care and symptom management     Chief Complaint/History of Present Illness:   Patient is 72 year old female with a past medical history of asthma, COPD on chronic steroid use, tobacco use, anxiety/depression, and fibromyalgia who was admitted on 07/04/2022 for management of worsening shortness of breath.  Patient has had multiple admissions for management of end-stage lung disease.  Palliative medicine team consulted to assist with complex medical decision making and symptom management.  Extensive review of EMR prior to presenting to bedside.  At time of EMR review patient has been receiving Norco 5-325 mg 2 tabs daily, prednisone 60 mg daily, and Valium 10 mg twice daily as needed. Discussed care with pulmonologist, Dr. Thora Lance for updates regarding end-stage lung disease.  Presented to bedside to meet with patient.  Patient laying in bed.  Introduced myself as a member of the palliative medicine team.  Patient spent time discussing her medical journey up into this point with me.  Patient discusses frustration with multiple readmissions for her end-stage lung disease.  Patient does not feel that interventions like BiPAP improve her feeling better.  Patient easily able to state that her goals for medical care moving forward focus on her being comfortable at home.  She can voice she wants to be able to die at home.  Patient then noted she had been referred to [facility] hospice earlier in the year and they "denied" her stating she was not eligible for care.  Discussed based on current eligibility requirements and discussion with pulmonologist, patient does currently meet hospice criteria guidelines for end-stage lung disease.  Patient's goals for medical care also  allowing with hospice care.  Patient specifically requesting referral to [facility] not [facility] due to prior interactions with them.  Able to review patient's symptom management at this time as well.  Patient notes that her shortness of breath increases with any exertion.  Can see patient shortness of breath increasing with just participating in conversation while she is laying in bed.  Patient does feel that the Norco combination medicine helps with her shortness of breath though she only receives it once a day and so it wears off.  Discussed discontinuing Norco and instead transitioning to opioid to assist with shortness of breath management that she can receive every 4 hours as needed.  Patient agreeing with change to oxycodone 5 mg every 4 hours as needed.  If patient needing frequent dosing of this, may need long-acting medicine scheduled. Patient notes last bowel movement was yesterday.  Patient does not feel constipated at this time.  Patient hesitant to take regular bowel regimen though we also discussed need for regular bowel movements while taking opioids.  Patient noted will inform if no bowel movement tomorrow. So took time to discuss patient's anxiety.  Patient has been on Valium 10 mg twice daily as needed for years according to her.  Patient notes that sometimes she only needs the Valium once a day or not at all.  Patient's anxiety is related to "my mind running away while my body is failing".  Acknowledged this and her anxiety not being related to her shortness of breath.  Continuing Valium at current dose.  All questions answered at that time.  Thanked patient for allowing me  to visit with her today.  Noted palliative medicine team and continue to follow along with medical journey.  Informed care team of conversation with patient and desire patient to return home with [facility] support.   Primary Diagnoses  Present on Admission:  COPD with acute exacerbation  (HCC)  HTN (hypertension)  Acute respiratory failure with hypoxia (HCC)   Palliative Review of Systems: Shortness of breath   Past Medical History:  Diagnosis Date   Anxiety    Arthritis    Cataract    COPD (chronic obstructive pulmonary disease) (HCC)    Depression    Fibromyalgia    Macular degeneration    Seizures (HCC)    Social History   Socioeconomic History   Marital status: Widowed    Spouse name: Not on file   Number of children: 1   Years of education: 16   Highest education level: Bachelor's degree (e.g., BA, AB, BS)  Occupational History   Occupation: Retired  Tobacco Use   Smoking status: Former    Packs/day: 1.00    Years: 35.00    Additional pack years: 0.00    Total pack years: 35.00    Types: Cigarettes    Quit date: 03/22/2022    Years since quitting: 0.2   Smokeless tobacco: Current  Substance and Sexual Activity   Alcohol use: No   Drug use: No   Sexual activity: Not Currently  Other Topics Concern   Not on file  Social History Narrative   Lives alone in [place]. Sings Karaoke in free time.    Social Determinants of Health   Financial Resource Strain: Not on file  Food Insecurity: No Food Insecurity (07/04/2022)   Hunger Vital Sign    Worried About Running Out of Food in the Last Year: Never true    Ran Out of Food in the Last Year: Never true  Transportation Needs: No Transportation Needs (07/04/2022)   PRAPARE - Administrator, Civil Service (Medical): No    Lack of Transportation (Non-Medical): No  Physical Activity: Not on file  Stress: Not on file  Social Connections: Not on file   Family History  Problem Relation Age of Onset   Emphysema Mother    Arthritis Father    Anxiety disorder Sister    Drug abuse Sister    Depression Sister    Alcohol abuse Sister    Bipolar disorder Sister    Arthritis Sister    Joint hypermobility Brother    Scheduled Meds:  arformoterol  15 mcg Nebulization BID   budesonide (PULMICORT)  nebulizer solution  0.5 mg Nebulization BID   citalopram  20 mg Oral Daily   doxycycline  100 mg Oral BID   enoxaparin (LOVENOX) injection  40 mg Subcutaneous Q24H   HYDROcodone-acetaminophen  2 tablet Oral Daily   nicotine  14 mg Transdermal Daily   polyethylene glycol  17 g Oral BID   predniSONE  60 mg Oral Q breakfast   revefenacin  175 mcg Nebulization Daily   Continuous Infusions: PRN Meds:.acetaminophen, albuterol, alum & mag hydroxide-simeth, diazepam, hydrALAZINE, melatonin, mouth rinse, polyethylene glycol, prochlorperazine Allergies  Allergen Reactions   Atorvastatin Other (See Comments)    Blurry vision    Sulfa Antibiotics Hives   CBC:    Component Value Date/Time   WBC 13.6 (H) 07/05/2022 0452   HGB 12.1 07/05/2022 0452   HGB 12.7 10/17/2019 1622   HCT 38.2 07/05/2022 0452   HCT 39.8 10/17/2019  1622   PLT 247 07/05/2022 0452   PLT 228 07/05/2016 1000   MCV 96.0 07/05/2022 0452   MCV 91 10/17/2019 1622   NEUTROABS 10.2 (H) 07/04/2022 2031   NEUTROABS 3.8 10/17/2019 1622   LYMPHSABS 5.5 (H) 07/04/2022 2031   LYMPHSABS 4.5 (H) 10/17/2019 1622   MONOABS 1.1 (H) 07/04/2022 2031   EOSABS 0.1 07/04/2022 2031   EOSABS 0.1 10/17/2019 1622   BASOSABS 0.1 07/04/2022 2031   BASOSABS 0.1 10/17/2019 1622   Comprehensive Metabolic Panel:    Component Value Date/Time   NA 137 07/05/2022 0452   NA 145 (H) 10/17/2019 1622   K 4.1 07/05/2022 0452   CL 105 07/05/2022 0452   CO2 24 07/05/2022 0452   BUN 22 07/05/2022 0452   BUN 11 10/17/2019 1622   CREATININE 1.05 (H) 07/05/2022 0452   CREATININE 0.75 06/17/2011 1040   GLUCOSE 131 (H) 07/05/2022 0452   CALCIUM 7.9 (L) 07/05/2022 0452   AST 26 06/23/2022 1331   ALT 22 06/23/2022 1331   ALKPHOS 53 06/23/2022 1331   BILITOT 0.6 06/23/2022 1331   BILITOT <0.2 10/17/2019 1622   PROT 6.9 06/23/2022 1331   PROT 6.6 10/17/2019 1622   ALBUMIN 3.8 06/23/2022 1331   ALBUMIN 4.0 10/17/2019 1622    Physical Exam: Vital  Signs: BP (!) 139/96 (BP Location: Right Arm)   Pulse 92   Temp (!) 97.5 F (36.4 C) (Oral)   Resp 20   Ht 5\' 1"  (1.549 m)   Wt 47.6 kg   SpO2 96%   BMI 19.83 kg/m  SpO2: SpO2: 96 % O2 Device: O2 Device: Room Air O2 Flow Rate: O2 Flow Rate (L/min): 3 L/min Intake/output summary:  Intake/Output Summary (Last 24 hours) at 07/07/2022 1415 Last data filed at 07/07/2022 1220 Gross per 24 hour  Intake 600 ml  Output 200 ml  Net 400 ml   LBM: Last BM Date : 07/07/22 Baseline Weight: Weight: 47.6 kg Most recent weight: Weight: 47.6 kg  General: NAD, alert, chronically ill-appearing, cachectic Eyes: moist mucous membranes Cardiovascular: RRR Respiratory: Increased work of breathing noted during conversation with accessory muscle use Abdomen: not distended Extremities: Moving all appropriately Neuro: A&Ox4, following commands easily Psych: appropriately answers all questions          Palliative Performance Scale: 40%              Additional Data Reviewed: Recent Labs    07/04/22 2031 07/05/22 0452  WBC 17.0* 13.6*  HGB 12.9 12.1  PLT 281 247  NA 140 137  BUN 19 22  CREATININE 1.14* 1.05*    Imaging: DG Chest Port 1 View CLINICAL DATA:  Shortness of breath.  History of COPD.  EXAM: PORTABLE CHEST 1 VIEW  COMPARISON:  06/23/2022  FINDINGS: Heart size and pulmonary vascularity are normal. Emphysematous changes in the lungs. No airspace disease or consolidation. No pleural effusions. No pneumothorax. Mediastinal contours appear intact. Calcification of the aorta. No change since prior study.  IMPRESSION: Emphysematous changes in the lungs. No evidence of active pulmonary disease.  Electronically Signed   By: Burman Nieves M.D.   On: 07/04/2022 20:59    I personally reviewed recent imaging.   Palliative Care Assessment and Plan Summary of Established Goals of Care and Medical Treatment Preferences   Patient is 72 year old female with a past medical  history of asthma, COPD on chronic steroid use, tobacco use, anxiety/depression, and fibromyalgia who was admitted on 07/04/2022 for management of worsening shortness  of breath.  Patient has had multiple admissions for management of end-stage lung disease.  Palliative medicine team consulted to assist with complex medical decision making and symptom management.  # Complex medical decision making/goals of care  -Extensive conversation with patient as described in detail above in HPI.  Patient has had multiple hospitalizations for recurrent shortness of breath in setting of her end-stage lung disease.  Patient wants to focus on comfort and symptom management at home moving forward. Does not want to return to the hospital or follow up with outpatient providers, such as pulmonology, moving forward.   Patient would like to pursue home hospice support.  Patient can states she wants to be able to die in her own home.  -TOC consulted to assist with home hospice referral.  -  Code Status: DNR  Prognosis: < 6 months  # Symptom management  -Dyspnea/Pain, acute on chronic in setting of end stage lung disease   -Start oxycodone 5 mg every 4 hours as needed   -Discontinue Norco   -Anxiety   -Continue Valium 10mg  BID prn    -Constipation   -Has Miralax daily prn   -Discussed importance of regular bowel movements with patient. She will inform if symptoms of constipation present.   # Psycho-social/Spiritual Support:  - Support System: daughter, grandson - Desire for further Chaplain support:no- not currently when inquired with patient today  # Discharge Planning:  Home with Hospice- Patient requesting evaluation by [facility] to possible return home with their support to focus on symptom management at the end of life for her end stage lung disease  Thank you for allowing the palliative care team to participate in the care [facility].  Alvester Morin, DO Palliative Care Provider PMT #  [phone]  If patient remains symptomatic despite maximum doses, please call PMT at [phone] between 0700 and 1900. Outside of these hours, please call attending, as PMT does not have night coverage.  This provider spent a total of 81 minutes providing patient's care.  Includes review of EMR, discussing care with other staff members involved in patient's medical care, obtaining relevant history and information from patient and/or patient's family, and personal review of imaging and lab work. Greater than 50% of the time was spent counseling and coordinating care related to the above assessment and plan.     *Please note that this is a verbal dictation therefore any spelling or grammatical errors are due to the "Dragon [employer] One" system interpretation.

## 2022-07-07 NOTE — Progress Notes (Signed)
   07/07/22 2309  BiPAP/CPAP/SIPAP  BiPAP/CPAP/SIPAP Pt Type Adult  BiPAP/CPAP/SIPAP V60 (avaps Vt-380)  Mask Type Full face mask  Mask Size Medium  Set Rate 14 breaths/min  Respiratory Rate 27 breaths/min  EPAP 5 cmH2O  Pressure Support  (Min-10. Max-25)  FiO2 (%) 40 %  Flow Rate 0 lpm  Minute Ventilation 7.9  Leak 10  Peak Inspiratory Pressure (PIP) 11  Tidal Volume (Vt) 359  Patient Home Equipment No  Auto Titrate No  Press High Alarm 35 cmH2O  Press Low Alarm 5 cmH2O   Pt placed on bipap Avaps for the night

## 2022-07-07 NOTE — TOC Progression Note (Addendum)
Transition of Care [facility]) - Progression Note    Patient Details  Name: Jasmin Munoz MRN: [id number] Date of Birth: 12-11-1950  [facility] of [facility] CM/SW Contact  Howell Rucks, RN Phone Number: 07/07/2022, 3:00 PM  Clinical Narrative:  Palliative care team evaluation-pt to dc home with Home  Hospice, prefers [facility] rep-included rep-Cheri Kyung Rudd included in teams chat. TOC consult for Home Hospice. TOC will continue to follow.      Expected Discharge Plan: Home/Self Care Barriers to Discharge: Continued Medical Work up  Expected Discharge Plan and Services       Living arrangements for the past 2 months: Single Family Home                                       Social Determinants of Health (SDOH) Interventions SDOH Screenings   Food Insecurity: No Food Insecurity (07/04/2022)  Housing: Patient Declined (07/04/2022)  Transportation Needs: No Transportation Needs (07/04/2022)  Utilities: Not At Risk (07/04/2022)  Alcohol Screen: Low Risk  (09/13/2017)  Depression (PHQ2-9): Low Risk  (10/31/2019)  Tobacco Use: High Risk (07/04/2022)    Readmission Risk Interventions     No data to display

## 2022-07-07 NOTE — Hospital Course (Signed)
Jasmin Munoz is a 72 yo female with PMH COPD on chronic prednisone, ongoing tobacco use, fibromyalgia who presented with recurrent SOB/dyspnea and hypoxia. CXR showed emphysematous changes but no acute changes/findings. She was started on steroids and breathing treatments.

## 2022-07-07 NOTE — Progress Notes (Addendum)
Mobility Specialist - Progress Note   07/07/22 1509  Oxygen Therapy  SpO2 90 %  O2 Device Room Air  Patient Activity (if Appropriate) Ambulating  Mobility  Activity Ambulated independently in hallway  Level of Assistance Modified independent, requires aide device or extra time  Assistive Device None  Distance Ambulated (ft) 250 ft  Activity Response Tolerated well  Mobility Referral Yes  $Mobility charge 1 Mobility  Mobility Specialist Start Time (ACUTE ONLY) 0301  Mobility Specialist Stop Time (ACUTE ONLY) 0308  Mobility Specialist Time Calculation (min) (ACUTE ONLY) 7 min   Pt received in bed and agreeable to mobility. Pt had some wheezing during session but stated she was okay. Upon returning to room pt requested to use bathroom for a BM. No complaints during session. Pt to bed after session with all needs met.   Pre-mobility: 101 HR, 93% SpO2 (RA) During mobility: 105 HR, 90% SpO2 (RA) Post-mobility: 102 HR, 88% SpO2 (RA)  Chief Technology Officer

## 2022-07-07 NOTE — Progress Notes (Addendum)
Mobility Specialist - Progress Note   07/07/22 1103  Oxygen Therapy  SpO2 (!) 87 %  O2 Device Room Air  Mobility  Activity Ambulated independently in hallway  Level of Assistance Independent  Assistive Device None  Distance Ambulated (ft) 265 ft  Activity Response Tolerated well  Mobility Referral Yes  $Mobility charge 1 Mobility  Mobility Specialist Start Time (ACUTE ONLY) 1054  Mobility Specialist Stop Time (ACUTE ONLY) 1103  Mobility Specialist Time Calculation (min) (ACUTE ONLY) 9 min   Pt received in bed and agreeable to mobility. Pt had lots of wheezing during session, but still eager to ambulate. Encouraged pursed lip breaths during ambulation. Pt to bathroom after session with all needs met.     Pre-mobility: 89% SpO2 (RA) During mobility: 87% SpO2 (RA) Post-mobility: 97%  SPO2 (RA)     Chief Technology Officer

## 2022-07-08 DIAGNOSIS — Z79899 Other long term (current) drug therapy: Secondary | ICD-10-CM

## 2022-07-08 DIAGNOSIS — Z66 Do not resuscitate: Secondary | ICD-10-CM | POA: Diagnosis not present

## 2022-07-08 DIAGNOSIS — J441 Chronic obstructive pulmonary disease with (acute) exacerbation: Secondary | ICD-10-CM | POA: Diagnosis not present

## 2022-07-08 DIAGNOSIS — Z7189 Other specified counseling: Secondary | ICD-10-CM | POA: Diagnosis not present

## 2022-07-08 DIAGNOSIS — Z515 Encounter for palliative care: Secondary | ICD-10-CM | POA: Diagnosis not present

## 2022-07-08 DIAGNOSIS — R0902 Hypoxemia: Secondary | ICD-10-CM

## 2022-07-08 MED ORDER — IPRATROPIUM-ALBUTEROL 0.5-2.5 (3) MG/3ML IN SOLN: 3.0000 mL | Freq: Four times a day (QID) | RESPIRATORY_TRACT | 2 refills | Status: AC | PRN

## 2022-07-08 MED ORDER — OXYCODONE HCL 5 MG PO TABS: 5.0000 mg | ORAL_TABLET | ORAL | 0 refills | Status: AC | PRN

## 2022-07-08 MED ORDER — BUDESONIDE 0.5 MG/2ML IN SUSP: 0.5000 mg | Freq: Two times a day (BID) | RESPIRATORY_TRACT | 11 refills | Status: AC

## 2022-07-08 MED ORDER — PREDNISONE 10 MG PO TABS: ORAL_TABLET | ORAL | 3 refills | Status: AC

## 2022-07-08 MED ORDER — DOXYCYCLINE HYCLATE 100 MG PO TABS: 100.0000 mg | ORAL_TABLET | Freq: Two times a day (BID) | ORAL | 0 refills | Status: AC

## 2022-07-08 NOTE — Discharge Summary (Signed)
Physician Discharge Summary   ARRIHANNA ALBERICI ZOX:[id number] DOB: 11/29/1950 DOA: 07/04/2022  PCP: [facility]  Admit date: 07/04/2022 Discharge date: 07/08/2022   Admitted From: Home Disposition:  Home with hospice Discharging physician: Lewie Chamber, MD Barriers to discharge: none  Recommendations at discharge: Continue care with hospice Outpatient followup with pulmology as needed  Discharge Condition: stable CODE STATUS: DNR Diet recommendation:  Diet Orders (From admission, onward)     Start     Ordered   07/08/22 0000  Diet - low sodium heart healthy        07/08/22 1351   07/05/22 0034  Diet Heart Room service appropriate? Yes; Fluid consistency: Thin  Diet effective now       Question Answer Comment  Room service appropriate? Yes   Fluid consistency: Thin      07/05/22 0033            Hospital Course: Ms. Falter is a 72 yo female with PMH COPD on chronic prednisone, ongoing tobacco use, fibromyalgia who presented with recurrent SOB/dyspnea and hypoxia. CXR showed emphysematous changes but no acute changes/findings. She was started on steroids and breathing treatments.   Assessment and Plan:  COPD with acute exacerbation Chronic hypercarbic respiratory failure Acute hypoxia  Current tobacco user.  She started back smoking soon as she was discharged from the hospital. She now wishes to remain off tobacco upon discharge  -Evaluated by pulmonology and palliative care during hospitalization.  She was recommended for transitioning to hospice at discharge given end-stage lung disease - Steroid taper of prednisone given at discharge and will remain on chronic 10 mg daily - Breathing treatments transitioned to nebulizers per pulmonology recommendations.  She was discharged on Pulmicort and DuoNebs -Oxycodone initiated in place of Norco for air hunger/dyspnea - DME arranged prior to discharge   Tobacco abuse: - re-educated on cessation    Hypokalemia: Repleted   Chronic kidney disease stage IIIa: At baseline   Chronic anxiety/depression: Continue citalopram, diazepam as needed - may need further agents if remains on hospice for comfort    The patient's chronic medical conditions were treated accordingly per the patient's home medication regimen except as noted.  On day of discharge, patient was felt deemed stable for discharge. Patient/family member advised to call PCP or come back to ER if needed.   Principal Diagnosis: COPD with acute exacerbation [facility])  Discharge Diagnoses: Active Hospital Problems   Diagnosis Date Noted   COPD with acute exacerbation (HCC) 02/03/2013    Priority: 1.   Hypoxia 06/24/2022    Priority: 2.   Medication management 07/08/2022   Palliative care encounter 07/07/2022   Goals of care, counseling/discussion 07/07/2022   DNR (do not resuscitate) 07/07/2022   Shortness of breath 07/07/2022   Counseling and coordination of care 07/07/2022   Tobacco use 07/07/2022   High risk medication use 07/07/2022   HTN (hypertension) 02/03/2013    Resolved Hospital Problems  No resolved problems to display.     Discharge Instructions     Diet - low sodium heart healthy   Complete by: As directed    Increase activity slowly   Complete by: As directed       Allergies as of 07/08/2022       Reactions   Atorvastatin Other (See Comments)   Blurry vision    Sulfa Antibiotics Hives        Medication List     STOP taking these medications    albuterol 108 (90  Base) MCG/ACT inhaler Commonly known as: VENTOLIN HFA   Breztri Aerosphere 160-9-4.8 MCG/ACT Aero Generic drug: Budeson-Glycopyrrol-Formoterol   Dupixent 300 MG/2ML Sopn Generic drug: Dupilumab   HYDROcodone-acetaminophen 5-325 MG tablet Commonly known as: NORCO/VICODIN       TAKE these medications    budesonide 0.5 MG/2ML nebulizer solution Commonly known as: PULMICORT Take 2 mLs (0.5 mg total) by nebulization 2  (two) times daily.   citalopram 20 MG tablet Commonly known as: CELEXA Take 1 tablet (20 mg total) by mouth daily.   diazepam 10 MG tablet Commonly known as: VALIUM Take 1 tablet (10 mg total) by mouth 2 (two) times daily as needed for anxiety. What changed:  how much to take when to take this   doxycycline 100 MG tablet Commonly known as: VIBRA-TABS Take 1 tablet (100 mg total) by mouth 2 (two) times daily for 2 days.   ipratropium-albuterol 0.5-2.5 (3) MG/3ML Soln Commonly known as: DUONEB Take 3 mLs by nebulization every 6 (six) hours as needed.   naloxone 4 MG/0.1ML Liqd nasal spray kit Commonly known as: NARCAN Place 0.4 mg into the nose once.   naproxen sodium 220 MG tablet Commonly known as: ALEVE Take 220-440 mg by mouth as needed (pain).   oxyCODONE 5 MG immediate release tablet Commonly known as: Oxy IR/ROXICODONE Take 1 tablet (5 mg total) by mouth every 4 (four) hours as needed for moderate pain or severe pain (dyspnea).   predniSONE 10 MG tablet Commonly known as: DELTASONE Take 6 tablets once a day for 4 days, then 4 tablets once a day for 4 days, then 2 tablets once a day for 4 days then 1 tablet daily thereafter Start taking on: July 09, 2022 Notes to patient: 07/09/22               Durable Medical Equipment  (From admission, onward)           Start     Ordered   07/08/22 1135  For home use only DME Nebulizer/meds  Once       Question Answer Comment  Patient needs a nebulizer to treat with the following condition COPD (chronic obstructive pulmonary disease) (HCC)   Length of Need Lifetime      07/08/22 1134            Allergies  Allergen Reactions   Atorvastatin Other (See Comments)    Blurry vision    Sulfa Antibiotics Hives    Consultations: Pulmonology  Palliative Care   Procedures:   Discharge Exam: BP (!) 112/94 (BP Location: Right Arm)   Pulse 84   Temp 98.1 F (36.7 C) (Oral)   Resp 20   Ht 5\' 1"  (1.549 m)    Wt 47.6 kg   SpO2 96%   BMI 19.83 kg/m  Physical Exam Constitutional:      General: She is not in acute distress.    Comments: Much more comfortable appearing and less anxious  HENT:     Head: Normocephalic and atraumatic.     Mouth/Throat:     Mouth: Mucous membranes are moist.  Eyes:     Extraocular Movements: Extraocular movements intact.  Cardiovascular:     Rate and Rhythm: Normal rate and regular rhythm.  Pulmonary:     Comments: Scattered coarse breath sounds bilaterally with slightly improved air movement Abdominal:     General: Bowel sounds are normal. There is no distension.     Palpations: Abdomen is soft.     Tenderness:  There is no abdominal tenderness.  Musculoskeletal:        General: No swelling. Normal range of motion.     Cervical back: Normal range of motion and neck supple.  Skin:    General: Skin is warm and dry.  Neurological:     General: No focal deficit present.     Mental Status: She is alert.  Psychiatric:        Mood and Affect: Mood normal.      The results of significant diagnostics from this hospitalization (including imaging, microbiology, ancillary and laboratory) are listed below for reference.   Microbiology: Recent Results (from the past 240 hour(s))  Resp panel by RT-PCR (RSV, Flu A&B, Covid)     Status: None   Collection Time: 07/04/22  8:44 PM  Result Value Ref Range Status   SARS Coronavirus 2 by RT PCR NEGATIVE NEGATIVE Final    Comment: (NOTE) SARS-CoV-2 target nucleic acids are NOT DETECTED.  The SARS-CoV-2 RNA is generally detectable in upper respiratory specimens during the acute phase of infection. The lowest concentration of SARS-CoV-2 viral copies this assay can detect is 138 copies/mL. A negative result does not preclude SARS-Cov-2 infection and should not be used as the sole basis for treatment or other patient management decisions. A negative result may occur with  improper specimen collection/handling, submission  of specimen other than nasopharyngeal swab, presence of viral mutation(s) within the areas targeted by this assay, and inadequate number of viral copies(<138 copies/mL). A negative result must be combined with clinical observations, patient history, and epidemiological information. The expected result is Negative.  Fact Sheet for Patients:  BloggerCourse.com  Fact Sheet for Healthcare Providers:  SeriousBroker.it  This test is no t yet approved or cleared by the [place] FDA and  has been authorized for detection and/or diagnosis of SARS-CoV-2 by FDA under an Emergency Use Authorization (EUA). This EUA will remain  in effect (meaning this test can be used) for the duration of the COVID-19 declaration under Section 564(b)(1) of the Act, 21 U.S.C.section 360bbb-3(b)(1), unless the authorization is terminated  or revoked sooner.       Influenza A by PCR NEGATIVE NEGATIVE Final   Influenza B by PCR NEGATIVE NEGATIVE Final    Comment: (NOTE) The Xpert Xpress SARS-CoV-2/FLU/RSV plus assay is intended as an aid in the diagnosis of influenza from Nasopharyngeal swab specimens and should not be used as a sole basis for treatment. Nasal washings and aspirates are unacceptable for Xpert Xpress SARS-CoV-2/FLU/RSV testing.  Fact Sheet for Patients: BloggerCourse.com  Fact Sheet for Healthcare Providers: SeriousBroker.it  This test is not yet approved or cleared by the [place] FDA and has been authorized for detection and/or diagnosis of SARS-CoV-2 by FDA under an Emergency Use Authorization (EUA). This EUA will remain in effect (meaning this test can be used) for the duration of the COVID-19 declaration under Section 564(b)(1) of the Act, 21 U.S.C. section 360bbb-3(b)(1), unless the authorization is terminated or revoked.     Resp Syncytial Virus by PCR NEGATIVE NEGATIVE  Final    Comment: (NOTE) Fact Sheet for Patients: BloggerCourse.com  Fact Sheet for Healthcare Providers: SeriousBroker.it  This test is not yet approved or cleared by the [place] FDA and has been authorized for detection and/or diagnosis of SARS-CoV-2 by FDA under an Emergency Use Authorization (EUA). This EUA will remain in effect (meaning this test can be used) for the duration of the COVID-19 declaration under Section 564(b)(1) of the Act, 21  U.S.C. section 360bbb-3(b)(1), unless the authorization is terminated or revoked.  Performed at [facility], [address], [place], [place] [address]      Labs: BNP (last 3 results) Recent Labs    02/06/22 0033 07/04/22 2032  BNP 43.7 70.0   Basic Metabolic Panel: Recent Labs  Lab 07/04/22 2031 07/05/22 0452  NA 140 137  K 3.1* 4.1  CL 102 105  CO2 28 24  GLUCOSE 119* 131*  BUN 19 22  CREATININE 1.14* 1.05*  CALCIUM 8.8* 7.9*  MG  --  3.0*  PHOS  --  3.5   Liver Function Tests: No results for input(s): "AST", "ALT", "ALKPHOS", "BILITOT", "PROT", "ALBUMIN" in the last 168 hours. No results for input(s): "LIPASE", "AMYLASE" in the last 168 hours. No results for input(s): "AMMONIA" in the last 168 hours. CBC: Recent Labs  Lab 07/04/22 2031 07/05/22 0452  WBC 17.0* 13.6*  NEUTROABS 10.2*  --   HGB 12.9 12.1  HCT 41.3 38.2  MCV 96.3 96.0  PLT 281 247   Cardiac Enzymes: No results for input(s): "CKTOTAL", "CKMB", "CKMBINDEX", "TROPONINI" in the last 168 hours. BNP: Invalid input(s): "POCBNP" CBG: No results for input(s): "GLUCAP" in the last 168 hours. D-Dimer No results for input(s): "DDIMER" in the last 72 hours. Hgb A1c No results for input(s): "HGBA1C" in the last 72 hours. Lipid Profile No results for input(s): "CHOL", "HDL", "LDLCALC", "TRIG", "CHOLHDL", "LDLDIRECT" in the last 72 hours. Thyroid function studies No results  for input(s): "TSH", "T4TOTAL", "T3FREE", "THYROIDAB" in the last 72 hours.  Invalid input(s): "FREET3" Anemia work up No results for input(s): "VITAMINB12", "FOLATE", "FERRITIN", "TIBC", "IRON", "RETICCTPCT" in the last 72 hours. Urinalysis    Component Value Date/Time   COLORURINE AMBER (A) 06/30/2019 0103   APPEARANCEUR CLOUDY (A) 06/30/2019 0103   LABSPEC 1.011 06/30/2019 0103   PHURINE 7.0 06/30/2019 0103   GLUCOSEU NEGATIVE 06/30/2019 0103   HGBUR SMALL (A) 06/30/2019 0103   BILIRUBINUR NEGATIVE 06/30/2019 0103   BILIRUBINUR negative 07/26/2016 1554   BILIRUBINUR neg 03/24/2012 0753   KETONESUR NEGATIVE 06/30/2019 0103   PROTEINUR 30 (A) 06/30/2019 0103   UROBILINOGEN 0.2 07/26/2016 1554   UROBILINOGEN 1.0 02/03/2013 1815   NITRITE POSITIVE (A) 06/30/2019 0103   LEUKOCYTESUR LARGE (A) 06/30/2019 0103   Sepsis Labs Recent Labs  Lab 07/04/22 2031 07/05/22 0452  WBC 17.0* 13.6*   Microbiology Recent Results (from the past 240 hour(s))  Resp panel by RT-PCR (RSV, Flu A&B, Covid)     Status: None   Collection Time: 07/04/22  8:44 PM  Result Value Ref Range Status   SARS Coronavirus 2 by RT PCR NEGATIVE NEGATIVE Final    Comment: (NOTE) SARS-CoV-2 target nucleic acids are NOT DETECTED.  The SARS-CoV-2 RNA is generally detectable in upper respiratory specimens during the acute phase of infection. The lowest concentration of SARS-CoV-2 viral copies this assay can detect is 138 copies/mL. A negative result does not preclude SARS-Cov-2 infection and should not be used as the sole basis for treatment or other patient management decisions. A negative result may occur with  improper specimen collection/handling, submission of specimen other than nasopharyngeal swab, presence of viral mutation(s) within the areas targeted by this assay, and inadequate number of viral copies(<138 copies/mL). A negative result must be combined with clinical observations, patient history, and  epidemiological information. The expected result is Negative.  Fact Sheet for Patients:  BloggerCourse.com  Fact Sheet for Healthcare Providers:  SeriousBroker.it  This test  is no t yet approved or cleared by the [place] and  has been authorized for detection and/or diagnosis of SARS-CoV-2 by FDA under an Emergency Use Authorization (EUA). This EUA will remain  in effect (meaning this test can be used) for the duration of the COVID-19 declaration under Section 564(b)(1) of the Act, 21 U.S.C.section 360bbb-3(b)(1), unless the authorization is terminated  or revoked sooner.       Influenza A by PCR NEGATIVE NEGATIVE Final   Influenza B by PCR NEGATIVE NEGATIVE Final    Comment: (NOTE) The Xpert Xpress SARS-CoV-2/FLU/RSV plus assay is intended as an aid in the diagnosis of influenza from Nasopharyngeal swab specimens and should not be used as a sole basis for treatment. Nasal washings and aspirates are unacceptable for Xpert Xpress SARS-CoV-2/FLU/RSV testing.  Fact Sheet for Patients: BloggerCourse.com  Fact Sheet for Healthcare Providers: SeriousBroker.it  This test is not yet approved or cleared by the [place] FDA and has been authorized for detection and/or diagnosis of SARS-CoV-2 by FDA under an Emergency Use Authorization (EUA). This EUA will remain in effect (meaning this test can be used) for the duration of the COVID-19 declaration under Section 564(b)(1) of the Act, 21 U.S.C. section 360bbb-3(b)(1), unless the authorization is terminated or revoked.     Resp Syncytial Virus by PCR NEGATIVE NEGATIVE Final    Comment: (NOTE) Fact Sheet for Patients: BloggerCourse.com  Fact Sheet for Healthcare Providers: SeriousBroker.it  This test is not yet approved or cleared by the [place] FDA and has been  authorized for detection and/or diagnosis of SARS-CoV-2 by FDA under an Emergency Use Authorization (EUA). This EUA will remain in effect (meaning this test can be used) for the duration of the COVID-19 declaration under Section 564(b)(1) of the Act, 21 U.S.C. section 360bbb-3(b)(1), unless the authorization is terminated or revoked.  Performed at [facility], [address], [place], [place] [address]     Procedures/Studies: DG Chest Port 1 View  Result Date: 07/04/2022 CLINICAL DATA:  Shortness of breath.  History of COPD. EXAM: PORTABLE CHEST 1 VIEW COMPARISON:  06/23/2022 FINDINGS: Heart size and pulmonary vascularity are normal. Emphysematous changes in the lungs. No airspace disease or consolidation. No pleural effusions. No pneumothorax. Mediastinal contours appear intact. Calcification of the aorta. No change since prior study. IMPRESSION: Emphysematous changes in the lungs. No evidence of active pulmonary disease. Electronically Signed   By: Burman Nieves M.D.   On: 07/04/2022 20:59   ECHOCARDIOGRAM COMPLETE  Result Date: 06/25/2022    ECHOCARDIOGRAM REPORT   Patient Name:   NANINE DYSINGER Date of Exam: 06/25/2022 Medical Rec #:  [id number]      Height:       61.0 in Accession #:    [id number]     Weight:       112.7 lb Date of Birth:  1950-03-06      BSA:          1.480 m Patient Age:    71 years       BP:           175/105 mmHg Patient Gender: F              HR:           88 bpm. Exam Location:  Inpatient Procedure: 2D Echo, Color Doppler and Cardiac Doppler Indications:    Dyspnea  History:        Patient has no prior history of Echocardiogram examinations.  COPD, Signs/Symptoms:Dyspnea; Risk Factors:Hypertension.  Sonographer:    Lucy Antigua Referring Phys: [phone] MATTHEW R HUNSUCKER  Sonographer Comments: Suboptimal parasternal window and suboptimal subcostal window. Image acquisition challenging due to COPD. IMPRESSIONS  1. Left ventricular  ejection fraction, by estimation, is 60 to 65%. The left ventricle has hyperdynamic function. The left ventricle has no regional wall motion abnormalities. Left ventricular diastolic parameters were normal.  2. Right ventricular systolic function is normal. The right ventricular size is normal. There is normal pulmonary artery systolic pressure.  3. The mitral valve is normal in structure. No evidence of mitral valve regurgitation. No evidence of mitral stenosis.  4. The aortic valve is normal in structure. Aortic valve regurgitation is not visualized. No aortic stenosis is present.  5. The inferior vena cava is normal in size with greater than 50% respiratory variability, suggesting right atrial pressure of 3 mmHg. FINDINGS  Left Ventricle: Left ventricular ejection fraction, by estimation, is 60 to 65%. The left ventricle has hyperdynamic function. The left ventricle has no regional wall motion abnormalities. The left ventricular internal cavity size was normal in size. There is no left ventricular hypertrophy. Left ventricular diastolic parameters were normal. Right Ventricle: The right ventricular size is normal. No increase in right ventricular wall thickness. Right ventricular systolic function is normal. There is normal pulmonary artery systolic pressure. The tricuspid regurgitant velocity is 1.89 m/s, and  with an assumed right atrial pressure of 3 mmHg, the estimated right ventricular systolic pressure is 17.3 mmHg. Left Atrium: Left atrial size was normal in size. Right Atrium: Right atrial size was normal in size. Pericardium: There is no evidence of pericardial effusion. Mitral Valve: The mitral valve is normal in structure. No evidence of mitral valve regurgitation. No evidence of mitral valve stenosis. Tricuspid Valve: The tricuspid valve is normal in structure. Tricuspid valve regurgitation is not demonstrated. No evidence of tricuspid stenosis. Aortic Valve: The aortic valve is normal in structure.  Aortic valve regurgitation is not visualized. No aortic stenosis is present. Aortic valve mean gradient measures 3.0 mmHg. Aortic valve peak gradient measures 6.4 mmHg. Aortic valve area, by VTI measures 2.05 cm. Pulmonic Valve: The pulmonic valve was normal in structure. Pulmonic valve regurgitation is not visualized. No evidence of pulmonic stenosis. Aorta: The aortic root is normal in size and structure. Venous: The inferior vena cava is normal in size with greater than 50% respiratory variability, suggesting right atrial pressure of 3 mmHg. IAS/Shunts: No atrial level shunt detected by color flow Doppler.  LEFT VENTRICLE PLAX 2D LVIDd:         4.00 cm     Diastology LVIDs:         2.50 cm     LV e' medial:    3.37 cm/s LV PW:         1.00 cm     LV E/e' medial:  21.5 LV IVS:        0.70 cm     LV e' lateral:   8.05 cm/s LVOT diam:     2.00 cm     LV E/e' lateral: 9.0 LV SV:         57 LV SV Index:   38 LVOT Area:     3.14 cm  LV Volumes (MOD) LV vol d, MOD A4C: 42.5 ml LV vol s, MOD A4C: 13.6 ml LV SV MOD A4C:     42.5 ml RIGHT VENTRICLE RV S prime:     16.00 cm/s TAPSE (M-mode): 2.8  cm LEFT ATRIUM           Index        RIGHT ATRIUM           Index LA Vol (A4C): 18.2 ml 12.30 ml/m  RA Area:     10.80 cm                                    RA Volume:   25.20 ml  17.03 ml/m  AORTIC VALVE AV Area (Vmax):    2.07 cm AV Area (Vmean):   1.83 cm AV Area (VTI):     2.05 cm AV Vmax:           126.00 cm/s AV Vmean:          86.000 cm/s AV VTI:            0.277 m AV Peak Grad:      6.4 mmHg AV Mean Grad:      3.0 mmHg LVOT Vmax:         82.90 cm/s LVOT Vmean:        50.200 cm/s LVOT VTI:          0.181 m LVOT/AV VTI ratio: 0.65  AORTA Ao Root diam: 2.80 cm Ao Asc diam:  3.60 cm MITRAL VALVE               TRICUSPID VALVE MV Area (PHT): 3.53 cm    TR Peak grad:   14.3 mmHg MV Decel Time: 215 msec    TR Vmax:        189.00 cm/s MV E velocity: 72.40 cm/s MV A velocity: 96.40 cm/s  SHUNTS MV E/A ratio:  0.75         Systemic VTI:  0.18 m                            Systemic Diam: 2.00 cm Aditya Sabharwal Electronically signed by Dorthula Nettles Signature Date/Time: 06/25/2022/1:23:33 PM    Final    CT Angio Chest Pulmonary Embolism (PE) W or WO Contrast  Result Date: 06/23/2022 CLINICAL DATA:  Shortness of breath for 1 week.  PE suspected. EXAM: CT ANGIOGRAPHY CHEST WITH CONTRAST TECHNIQUE: Multidetector CT imaging of the chest was performed using the standard protocol during bolus administration of intravenous contrast. Multiplanar CT image reconstructions and MIPs were obtained to evaluate the vascular anatomy. RADIATION DOSE REDUCTION: This exam was performed according to the departmental dose-optimization program which includes automated exposure control, adjustment of the mA and/or kV according to patient size and/or use of iterative reconstruction technique. CONTRAST:  80mL OMNIPAQUE IOHEXOL 350 MG/ML SOLN COMPARISON:  Chest radiograph 06/23/2022; PET/CT 06/15/2021; CT chest 10/02/2021 FINDINGS: Cardiovascular: Normal heart size. Coronary artery and aortic atherosclerotic calcification. Satisfactory opacification of the pulmonary arteries to the segmental level. No pulmonary embolism. Unchanged dilation the ascending aorta measuring 41 mm in maximum diameter. Mediastinum/Nodes: Unchanged anterior mediastinal nodule measuring 2.3 cm in greatest dimension (series 10/image 189). This was not FDG avid on PET/CT 10/16/2021. Unremarkable esophagus and trachea. No thoracic adenopathy. Lungs/Pleura: Centrilobular emphysema greatest in the upper lobes. Mild bronchial wall thickening and mucous plugging in the lower lobes. No focal consolidation, pleural effusion, or pneumothorax. Unchanged 4 mm nodule in the right lower lobe (12/87). There is a new 3 mm nodule adjacent to this nodule (12/87). Upper Abdomen: Nonobstructing bilateral nephrolithiasis.  No acute abnormality. Musculoskeletal: Remote left rib fracture.  No acute  fractures. Review of the MIP images confirms the above findings. IMPRESSION: 1. No evidence of pulmonary embolism. 2. 4.1 cm ascending thoracic aortic aneurysm, unchanged. Recommend annual imaging followup by CTA or MRA. This recommendation follows 2010 ACCF/AHA/AATS/ACR/ASA/SCA/SCAI/SIR/STS/SVM Guidelines for the Diagnosis and Management of Patients with Thoracic Aortic Disease. Circulation. 2010; 121: Z610-R604. Aortic aneurysm NOS (ICD10-I71.9) 3. Stable 4 mm nodule in the right lower lobe compared with 10/02/2021. There is a new adjacent 3 mm pulmonary nodule. Non-contrast chest CT can be considered in 12 months in this high-risk patient. This recommendation follows the consensus statement: Guidelines for Management of Incidental Pulmonary Nodules Detected on CT Images: From the Fleischner Society 2017; Radiology 2017; 284:228-243. 4. Stable circumscribed nodule in the anterior mediastinum which was not FDG avid on PET/CT 10/16/2021 and is likely benign. Aortic Atherosclerosis (ICD10-I70.0) and Emphysema (ICD10-J43.9). Electronically Signed   By: Minerva Fester M.D.   On: 06/23/2022 19:03   DG Chest 2 View  Result Date: 06/23/2022 CLINICAL DATA:  Shortness of breath.  History of emphysema. EXAM: CHEST - 2 VIEW COMPARISON:  Chest radiographs 03/22/2022 and 09/09/2021; CT chest 10/02/2021 FINDINGS: Cardiac silhouette and mediastinal contours are within normal limits. Mild calcification within aortic arch. There is flattening of the diaphragms and moderate hyperinflation, unchanged. There increased lucencies within the bilateral lungs, especially superiorly, consistent with the moderate chronic emphysematous changes seen on prior CT. No acute airspace opacity. Right-greater-than-left apical scarring, similar to prior radiographs and CT. No pleural effusion pneumothorax. Moderate multilevel degenerative disc changes of the thoracic spine. IMPRESSION: 1. No acute lung process. 2. Moderate chronic emphysematous  changes, similar to prior CT. Electronically Signed   By: Neita Garnet M.D.   On: 06/23/2022 14:10     Time coordinating discharge: Over 30 minutes    Lewie Chamber, MD  [facility] 07/08/2022, 5:12 PM

## 2022-07-08 NOTE — Consult Note (Signed)
NAME:  Jasmin Munoz, MRN:  [id number], DOB:  April 10, 1950, LOS: 4 ADMISSION DATE:  07/04/2022, CONSULTATION DATE:  6/5 REFERRING MD:  Dr. Frederick Peers, CHIEF COMPLAINT:  COPD exacerbation   History of Present Illness:  72 year old female with history of asthma and COPD overlap followed in the pulmonary clinic by Dr. Judeth Horn. In addition to the usual inhaled therapies she is also treated with chronic prednisone and dupixent.  She was recently admitted for COPD exacerbation from 5/22 -5/24 and was treated with the typical modalities including nebulized bronchodilators and systemic steroids. She was able to be discharged home with home oxygen. Since returning home she continued to feel week and developed worsening dyspnea prompting her to return to [facility] 6/2. She was again admitted for COPD exacerbation after improving with steroids, nebs, and BiPAP in the ED. Treatment was initiated with steroid, nebs, and antibiotics. PCCM was consulted 6/5 for further management of COPD exacerbation. Jasmin Munoz tells me she is in a cycle of hospitalizations during which she improves and gets discharged just to get worse almost immediately. She is tired of this cycle and wants to stop suffering.   Pertinent  Medical History   has a past medical history of Anxiety, Arthritis, Cataract, COPD (chronic obstructive pulmonary disease) (HCC), Depression, Fibromyalgia, Macular degeneration, and Seizures (HCC).   Significant Hospital Events: Including procedures, antibiotic start and stop dates in addition to other pertinent events   6/2 admit for COPD exacerbation 6/5 PCCM, Palliative medicine consulted.  Interim History / Subjective:  Subjective improvement with the additions of nebulizers and QHS AVAPS She met with Dr. Patterson Hammersmith yesterday and is now pursuing home with hospice.   Objective   Blood pressure (!) 112/94, pulse 84, temperature 98.1 F (36.7 C), temperature source Oral, resp. rate 20, height 5\' 1"  (1.549 m),  weight 47.6 kg, SpO2 96 %.    FiO2 (%):  [21 %-40 %] 21 %   Intake/Output Summary (Last 24 hours) at 07/08/2022 1000 Last data filed at 07/07/2022 1800 Gross per 24 hour  Intake 480 ml  Output --  Net 480 ml    Filed Weights   07/04/22 2309  Weight: 47.6 kg    Examination: General: Frail elderly appearing female in NAD HENT: [place]/AT, PERRL, no JVD Lungs: Poor air movement. No wheeze Cardiovascular: RRR, no MRG Abdomen: Soft, NT, ND Extremities: no acute deformity or edema.  Neuro: Alert, oriented, non-focal   Resolved Hospital Problem list     Assessment & Plan:   COPD exacerbation: end stage COPD at baseline on chronic steroids. Last FEV1 pre 33% - Continue steroids with extended taper back to home dose of 10mg  daily - Doxycyline for a 5 day course.  - Triple therapy nebs, which she should be discharged on. AVAPS with slow ramp if possible.  - She perceives a benefit from NIV. If it can be arranged through hospice she would like to use this at home QHS.  - Appreciate the palliative team with assistance of symptoms management. Now using oxycodone and valium.  - Tobacco cessation counseling provided - TOC has been consulted for home hospice referral.    Labs   CBC: Recent Labs  Lab 07/04/22 2031 07/05/22 0452  WBC 17.0* 13.6*  NEUTROABS 10.2*  --   HGB 12.9 12.1  HCT 41.3 38.2  MCV 96.3 96.0  PLT 281 247     Basic Metabolic Panel: Recent Labs  Lab 07/04/22 2031 07/05/22 0452  NA 140 137  K  3.1* 4.1  CL 102 105  CO2 28 24  GLUCOSE 119* 131*  BUN 19 22  CREATININE 1.14* 1.05*  CALCIUM 8.8* 7.9*  MG  --  3.0*  PHOS  --  3.5    GFR: Estimated Creatinine Clearance: 36.9 mL/min (A) (by C-G formula based on SCr of 1.05 mg/dL (H)). Recent Labs  Lab 07/04/22 2031 07/05/22 0452  WBC 17.0* 13.6*     Liver Function Tests: No results for input(s): "AST", "ALT", "ALKPHOS", "BILITOT", "PROT", "ALBUMIN" in the last 168 hours. No results for input(s):  "LIPASE", "AMYLASE" in the last 168 hours. No results for input(s): "AMMONIA" in the last 168 hours.  ABG    Component Value Date/Time   PHART 7.39 06/23/2022 1850   PCO2ART 39 06/23/2022 1850   PO2ART 84 06/23/2022 1850   HCO3 32.7 (H) 07/04/2022 2038   ACIDBASEDEF 1.2 06/23/2022 1850   O2SAT 66.6 07/04/2022 2038     Coagulation Profile: No results for input(s): "INR", "PROTIME" in the last 168 hours.  Cardiac Enzymes: No results for input(s): "CKTOTAL", "CKMB", "CKMBINDEX", "TROPONINI" in the last 168 hours.  HbA1C: Hemoglobin A1C  Date/Time Value Ref Range Status  10/17/2019 04:09 PM 5.3 4.0 - 5.6 % Final   Hgb A1c MFr Bld  Date/Time Value Ref Range Status  09/14/2017 06:29 AM 5.0 4.8 - 5.6 % Final    Comment:    (NOTE) Pre diabetes:          5.7%-6.4% Diabetes:              >6.4% Glycemic control for   <7.0% adults with diabetes     CBG: No results for input(s): "GLUCAP" in the last 168 hours.  Review of Systems:   Bolds are positive  Constitutional: weight loss, gain, night sweats, Fevers, chills, fatigue .  HEENT: headaches, Sore throat, sneezing, nasal congestion, post nasal drip, Difficulty swallowing, Tooth/dental problems, visual complaints visual changes, ear ache CV:  chest pain, radiates:,Orthopnea, PND, swelling in lower extremities, dizziness, palpitations, syncope.  GI  heartburn, indigestion, abdominal pain, nausea, vomiting, diarrhea, change in bowel habits, loss of appetite, bloody stools.  Resp: cough, productive: , hemoptysis, dyspnea, chest pain, pleuritic.  Skin: rash or itching or icterus GU: dysuria, change in color of urine, urgency or frequency. flank pain, hematuria  MS: joint pain or swelling. decreased range of motion  Psych: change in mood or affect. depression or anxiety. Brain fog Neuro: difficulty with speech, weakness, numbness, ataxia    Past Medical History:  She,  has a past medical history of Anxiety, Arthritis, Cataract,  COPD (chronic obstructive pulmonary disease) (HCC), Depression, Fibromyalgia, Macular degeneration, and Seizures (HCC).   Surgical History:   Past Surgical History:  Procedure Laterality Date   ABDOMINAL HYSTERECTOMY       Social History:   reports that she quit smoking about 3 months ago. Her smoking use included cigarettes. She has a 35.00 pack-year smoking history. She uses smokeless tobacco. She reports that she does not drink alcohol and does not use drugs.   Family History:  Her family history includes Alcohol abuse in her sister; Anxiety disorder in her sister; Arthritis in her father and sister; Bipolar disorder in her sister; Depression in her sister; Drug abuse in her sister; Emphysema in her mother; Joint hypermobility in her brother.   Allergies Allergies  Allergen Reactions   Atorvastatin Other (See Comments)    Blurry vision    Sulfa Antibiotics Hives     Home Medications  Prior to Admission medications   Medication Sig Start Date End Date Taking? Authorizing Provider  albuterol (VENTOLIN HFA) 108 (90 Base) MCG/ACT inhaler Inhale 2 puffs into the lungs every 6 (six) hours as needed for wheezing or shortness of breath. 03/18/22  Yes Hunsucker, Lesia Sago, MD  BREZTRI AEROSPHERE 160-9-4.8 MCG/ACT AERO INHALE 2 PUFFS INTO THE LUNGS IN THE MORNING AND AT BEDTIME. 01/12/22  Yes Hunsucker, Lesia Sago, MD  citalopram (CELEXA) 20 MG tablet Take 1 tablet (20 mg total) by mouth daily. 06/22/22  Yes White, Arlys John A, NP  diazepam (VALIUM) 10 MG tablet Take 1 tablet (10 mg total) by mouth 2 (two) times daily as needed for anxiety. Patient taking differently: Take 20 mg by mouth at bedtime. 06/21/22  Yes White, Brian A, NP  Dupilumab (DUPIXENT) 300 MG/2ML SOPN Inject 300 mg into the skin every 14 (fourteen) days. Loading dose completed in clinic on 03/18/2022 03/18/22  Yes Hunsucker, Lesia Sago, MD  HYDROcodone-acetaminophen (NORCO/VICODIN) 5-325 MG tablet Take 1-2 tablets by mouth See admin  instructions. Take 2 tablets by mouth in the morning and 1 tablet in the afternoon 06/12/21  Yes [provider]  naloxone (NARCAN) nasal spray 4 mg/0.1 mL Place 0.4 mg into the nose once.   Yes [provider]  naproxen sodium (ALEVE) 220 MG tablet Take 220-440 mg by mouth as needed (pain).   Yes [provider]  doxycycline (VIBRA-TABS) 100 MG tablet Take 1 tablet (100 mg total) by mouth 2 (two) times daily. Patient not taking: Reported on 07/05/2022 06/18/22   Hunsucker, Lesia Sago, MD     Critical care time:      Joneen Roach, AGACNP-BC [facility]  See Amion for personal pager PCCM on call pager [phone] until 7pm. Please call Elink 7p-7a. [phone]  07/08/2022 10:00 AM

## 2022-07-08 NOTE — TOC Transition Note (Addendum)
Transition of Care [facility]) - CM/SW Discharge Note   Patient Details  Name: Jasmin Munoz MRN: [id number] Date of Birth: 05/11/1950  Transition of Care [facility]) CM/SW Contact:  Adrian Prows, RN Phone Number: 07/08/2022, 12:22 PM   Clinical Narrative:    Notified pt will d/c today; she will d/c w' home hospice w/ [facility]; Norm Parcel at agency says DME delivered today; awaiting verification of pt mode of transport home.  -1228- notified pt will be transported home by her dtr; no TOC needs.   Final next level of care: Home w Hospice Care Barriers to Discharge: No Barriers Identified   Patient Goals and CMS Choice CMS Medicare.gov Compare Post Acute Care list provided to:: Patient Represenative (must comment) [name] dtr) Choice offered to / list presented to : Adult Children  Discharge Placement                         Discharge Plan and Services Additional resources added to the After Visit Summary for                                       Social Determinants of Health (SDOH) Interventions SDOH Screenings   Food Insecurity: No Food Insecurity (07/04/2022)  Housing: Patient Declined (07/04/2022)  Transportation Needs: No Transportation Needs (07/04/2022)  Utilities: Not At Risk (07/04/2022)  Alcohol Screen: Low Risk  (09/13/2017)  Depression (PHQ2-9): Low Risk  (10/31/2019)  Tobacco Use: High Risk (07/04/2022)     Readmission Risk Interventions     No data to display

## 2022-07-08 NOTE — Progress Notes (Signed)
Physical Therapy Treatment Patient Details Name: Jasmin Munoz MRN: [id number] DOB: 14-Aug-1950 Today's Date: 07/08/2022   History of Present Illness 72 yo female admitted with COPD exac. Hx of COPD, fibromyalgia, anxiety, depression, Sz, smoker, macular degeneration    PT Comments    Pt sitting up in bed eating lunch upon arrival, declined mobility. Discussed pt's current LOF and support at home. Pt feels comfortable with possible d/c home today. Education provided on pacing and energy conservation techniques. Pt issued B LE standing HEP to continue independently with good understanding. Will continue PT acutely per POC if d/c is delayed.    Recommendations for follow up therapy are one component of a multi-disciplinary discharge planning process, led by the attending physician.  Recommendations may be updated based on patient status, additional functional criteria and insurance authorization.  Follow Up Recommendations       Assistance Recommended at Discharge PRN  Patient can return home with the following Assist for transportation;Help with stairs or ramp for entrance   Equipment Recommendations  None recommended by PT    Recommendations for Other Services       Precautions / Restrictions Precautions Precautions: None Restrictions Weight Bearing Restrictions: No     Mobility  Bed Mobility                    Transfers                        Ambulation/Gait                   Stairs             Wheelchair Mobility    Modified Rankin (Stroke Patients Only)       Balance                                            Cognition Arousal/Alertness: Awake/alert Behavior During Therapy: WFL for tasks assessed/performed Overall Cognitive Status: Within Functional Limits for tasks assessed                                          Exercises      General Comments General comments (skin integrity,  edema, etc.):  (Pt sitting up eating lunch. Education provided on PLB, energy conservation techniques, and pacing upon returning home.)      Pertinent Vitals/Pain Pain Assessment Pain Assessment: No/denies pain    Home Living                          Prior Function            PT Goals (current goals can now be found in the care plan section) Acute Rehab PT Goals Patient Stated Goal: to get better    Frequency    Min 1X/week      PT Plan Current plan remains appropriate    Co-evaluation              AM-PAC PT "6 Clicks" Mobility   Outcome Measure  Help needed turning from your back to your side while in a flat bed without using bedrails?: None Help needed moving from lying on your back to sitting on the side of  a flat bed without using bedrails?: None Help needed moving to and from a bed to a chair (including a wheelchair)?: None Help needed standing up from a chair using your arms (e.g., wheelchair or bedside chair)?: None Help needed to walk in hospital room?: A Little Help needed climbing 3-5 steps with a railing? : A Little 6 Click Score: 22    End of Session   Activity Tolerance: Patient tolerated treatment well Patient left: in bed;with call bell/phone within reach Nurse Communication: Other (comment) (declined mobility) PT Visit Diagnosis: Difficulty in walking, not elsewhere classified (R26.2)     Time: 7829-5621 PT Time Calculation (min) (ACUTE ONLY): 9 min  Charges:  $Therapeutic Exercise: 8-22 mins                    Zadie Cleverly, PTA  Jannet Askew 07/08/2022, 2:33 PM

## 2022-07-08 NOTE — Progress Notes (Signed)
PT taken of bipap by RN per PT request, used approximately 4 hours last night.

## 2022-07-08 NOTE — Progress Notes (Signed)
Daily Progress Note   Patient Name: Jasmin Munoz       Date: 07/08/2022 DOB: [birth year]  Age: 72 y.o. MRN#: [id number] Attending Physician: Lewie Chamber, MD Primary Care Physician: [facility] Admit Date: 07/04/2022 Length of Stay: 4 days  Reason for Consultation/Follow-up: Establishing goals of care and symptom management  Subjective:   CC: Feels oxycodone helps management of shortness of breath.  Following up regarding complex medical decision making and symptom management.  Subjective:  Reviewed EMR prior to presenting to bedside.  At time of EMR review patient has received oxycodone 5 mg x 2.  When presenting to bedside, patient sitting up in bed.  Patient notes that the oxycodone does greatly assist with her symptoms associated with shortness of breath.  Patient noted that her daughter has been coordinating with hospice liaison.  Patient expressed that she was told to wear BiPAP last night though it was incredibly uncomfortable and she does not want to continue with that.  Acknowledged that she does not have to continue wearing BiPAP, especially when she wants to go home with hospice support.  Spent time providing emotional support via active listening.  Patient hopeful she will be able to return home with hospice support as soon as possible.  Updated care team regarding discussion with patient.  Assisting with coordination so patient can hopefully go home with hospice today.  Review of Systems Shortness of breath improved with oxycodone Objective:   Vital Signs:  BP (!) 112/94 (BP Location: Right Arm)   Pulse 84   Temp 98.1 F (36.7 C) (Oral)   Resp 20   Ht 5\' 1"  (1.549 m)   Wt 47.6 kg   SpO2 96%   BMI 19.83 kg/m   Physical Exam: General: NAD, alert, chronically ill-appearing, cachectic Eyes: moist mucous membranes Cardiovascular: RRR Respiratory: Increased work of breathing noted during conversation with accessory muscle use Abdomen: not  distended Extremities: Moving all appropriately Neuro: A&Ox4, following commands easily Psych: appropriately answers all questions  Imaging:  I personally reviewed recent imaging.   Assessment & Plan:   Assessment: Patient is 72 year old female with a past medical history of asthma, COPD on chronic steroid use, tobacco use, anxiety/depression, and fibromyalgia who was admitted on 07/04/2022 for management of worsening shortness of breath. Patient has had multiple admissions for management of end-stage lung disease. Palliative medicine team consulted to assist with complex medical decision making and symptom management.   Recommendations/Plan: # Complex medical decision making/goals of care:    -Patient hoping to go home with hospice support today.  Patient has had multiple hospitalizations for recurrent shortness of breath in setting of her end-stage lung disease.  Patient has already stated that she wants to to focus on comfort and symptom management at home moving forward. Does not want to return to the hospital or follow up with outpatient providers, such as pulmonology, moving forward.   Patient states she wants to be able to die in her own home.  Hopefully home with hospice today.                -  Code Status: DNR  Prognosis: < 6 months   # Symptom management                -Dyspnea/Pain, acute on chronic in setting of end stage lung disease                               -  Continue oxycodone 5 mg every 4 hours as needed                               -Discontinued Norco, please do not restart                  -Anxiety                               -Continue Valium 10mg  BID prn                   -Constipation                               -Has Miralax daily prn                               -Discussed importance of regular bowel movements with patient. She will inform if symptoms of constipation present.    # Psycho-social/Spiritual Support:  - Support System: daughter, grandson    # Discharge Planning:  Home with [facility] today  Discussed with: Patient, hospitalist, hospice liaison, TOC, pulmonary  Thank you for allowing the palliative care team to participate in the care [facility].  Alvester Morin, DO Palliative Care Provider PMT # [phone]  If patient remains symptomatic despite maximum doses, please call PMT at [phone] between 0700 and 1900. Outside of these hours, please call attending, as PMT does not have night coverage.  *Please note that this is a verbal dictation therefore any spelling or grammatical errors are due to the "[employer]" system interpretation.

## 2022-07-30 LAB — FLOW CYTOMETRY

## 2022-10-08 ENCOUNTER — Ambulatory Visit: Payer: Medicare HMO | Admitting: Behavioral Health
